# Patient Record
Sex: Female | Born: 1970
Health system: Southern US, Community
[De-identification: ages and names within clinical notes are randomized; demographics above are authoritative.]

## PROBLEM LIST (undated history)

## (undated) DIAGNOSIS — M1990 Unspecified osteoarthritis, unspecified site: Secondary | ICD-10-CM

## (undated) DIAGNOSIS — E785 Hyperlipidemia, unspecified: Secondary | ICD-10-CM

## (undated) DIAGNOSIS — T8859XA Other complications of anesthesia, initial encounter: Secondary | ICD-10-CM

## (undated) DIAGNOSIS — F32A Depression, unspecified: Secondary | ICD-10-CM

## (undated) DIAGNOSIS — Z9889 Other specified postprocedural states: Secondary | ICD-10-CM

## (undated) DIAGNOSIS — Z87440 Personal history of urinary (tract) infections: Secondary | ICD-10-CM

## (undated) DIAGNOSIS — R112 Nausea with vomiting, unspecified: Secondary | ICD-10-CM

## (undated) DIAGNOSIS — T7840XA Allergy, unspecified, initial encounter: Secondary | ICD-10-CM

## (undated) DIAGNOSIS — K921 Melena: Secondary | ICD-10-CM

## (undated) DIAGNOSIS — D649 Anemia, unspecified: Secondary | ICD-10-CM

## (undated) DIAGNOSIS — F419 Anxiety disorder, unspecified: Secondary | ICD-10-CM

## (undated) DIAGNOSIS — K219 Gastro-esophageal reflux disease without esophagitis: Secondary | ICD-10-CM

## (undated) DIAGNOSIS — Z8619 Personal history of other infectious and parasitic diseases: Secondary | ICD-10-CM

## (undated) HISTORY — PX: COSMETIC SURGERY: SHX468

## (undated) HISTORY — DX: Anemia, unspecified: D64.9

## (undated) HISTORY — DX: Personal history of other infectious and parasitic diseases: Z86.19

## (undated) HISTORY — PX: DILATION AND CURETTAGE OF UTERUS: SHX78

## (undated) HISTORY — DX: Allergy, unspecified, initial encounter: T78.40XA

## (undated) HISTORY — PX: WISDOM TOOTH EXTRACTION: SHX21

## (undated) HISTORY — DX: Unspecified osteoarthritis, unspecified site: M19.90

## (undated) HISTORY — DX: Melena: K92.1

## (undated) HISTORY — DX: Hyperlipidemia, unspecified: E78.5

## (undated) HISTORY — DX: Anxiety disorder, unspecified: F41.9

## (undated) HISTORY — DX: Depression, unspecified: F32.A

## (undated) HISTORY — DX: Personal history of urinary (tract) infections: Z87.440

## (undated) HISTORY — DX: Gastro-esophageal reflux disease without esophagitis: K21.9

---

## 1999-09-21 HISTORY — PX: AUGMENTATION MAMMAPLASTY: SUR837

## 1999-09-21 HISTORY — PX: OTHER SURGICAL HISTORY: SHX169

## 2005-09-10 ENCOUNTER — Ambulatory Visit: Payer: Self-pay

## 2005-09-13 ENCOUNTER — Ambulatory Visit: Payer: Self-pay

## 2005-09-14 ENCOUNTER — Ambulatory Visit: Payer: Self-pay

## 2006-05-03 ENCOUNTER — Inpatient Hospital Stay: Payer: Self-pay

## 2008-02-14 ENCOUNTER — Ambulatory Visit: Payer: Self-pay | Admitting: Unknown Physician Specialty

## 2008-02-15 ENCOUNTER — Inpatient Hospital Stay: Payer: Self-pay | Admitting: Unknown Physician Specialty

## 2008-05-16 ENCOUNTER — Ambulatory Visit: Payer: Self-pay | Admitting: Unknown Physician Specialty

## 2011-09-10 ENCOUNTER — Ambulatory Visit: Payer: Self-pay | Admitting: Unknown Physician Specialty

## 2011-12-31 ENCOUNTER — Ambulatory Visit: Payer: Self-pay

## 2013-02-06 ENCOUNTER — Ambulatory Visit: Payer: Self-pay

## 2014-02-19 ENCOUNTER — Ambulatory Visit: Payer: Self-pay | Admitting: Family Medicine

## 2015-01-29 ENCOUNTER — Other Ambulatory Visit: Payer: Self-pay | Admitting: Family Medicine

## 2015-01-29 DIAGNOSIS — Z1231 Encounter for screening mammogram for malignant neoplasm of breast: Secondary | ICD-10-CM

## 2015-02-21 ENCOUNTER — Ambulatory Visit
Admission: RE | Admit: 2015-02-21 | Discharge: 2015-02-21 | Disposition: A | Discharge status: Home / Self Care | Payer: 59 | Source: Ambulatory Visit | Attending: Family Medicine | Admitting: Family Medicine

## 2015-02-21 DIAGNOSIS — Z1231 Encounter for screening mammogram for malignant neoplasm of breast: Secondary | ICD-10-CM | POA: Insufficient documentation

## 2015-02-21 DIAGNOSIS — Z9882 Breast implant status: Secondary | ICD-10-CM | POA: Insufficient documentation

## 2015-06-13 ENCOUNTER — Telehealth: Payer: Self-pay

## 2015-06-13 NOTE — Telephone Encounter (Signed)
Pt concern for having bloated stomach with pain overnight, pt stated that she thinks it is gastritis, she said that yesterday she felt like she was hungry with pain and after eating her pain went away and once she became hungry her stomach started hurting again with some burning going up her esophagus; she was offered to be seen today at 2:00 pm or 2:30 pm with either Mikki Santee or Sonia Baller and she stated that she could not come do to her children and school.   Mikki Santee was verbally notified and awared of this pt; Mikki Santee ordered for pt to take 2 Prevacid 15 mg OTC and if not getting better he would see her on Saturday,( tomorrow), and that she can take it for 2 - 4 weeks. She was also advised to follow a blend diet. She verbalized fully understanding and stated that if she her symptoms get worst she would go to the Urgent care or ER. She was also advised if any questions or concerns.

## 2015-09-09 ENCOUNTER — Encounter: Payer: Self-pay | Admitting: Family Medicine

## 2015-09-09 ENCOUNTER — Ambulatory Visit (INDEPENDENT_AMBULATORY_CARE_PROVIDER_SITE_OTHER): Payer: 59 | Admitting: Family Medicine

## 2015-09-09 VITALS — BP 110/70 | HR 84 | Temp 99.4°F | Resp 16 | Wt 151.0 lb

## 2015-09-09 DIAGNOSIS — J069 Acute upper respiratory infection, unspecified: Secondary | ICD-10-CM

## 2015-09-09 DIAGNOSIS — M722 Plantar fascial fibromatosis: Secondary | ICD-10-CM | POA: Insufficient documentation

## 2015-09-09 NOTE — Progress Notes (Signed)
Subjective:     [name], female   DOB: 06-09-1971, 44 y.o.   MRN: [id number]  HPI  Chief Complaint  Patient presents with  . Sinus Problem    Patient comes in office today with concerns of sinus pain and pressure for the past 24 hrs. Patient states that she has pressure around her entire head and has had pressure in her chest. Patient has taken otc: Cold/Flu medicine and Allergy pills  States one of her children has been ill as well. Reports she typically has trouble clearing her sinuses and develops a sinus infection. No hx of sinus surgery.   Review of Systems  Constitutional: Negative for fever and chills.       Objective:   Physical Exam  Constitutional: She appears well-developed and well-nourished. No distress.  Ears: T.M's intact without inflammation Sinuses: non-tender Throat: no tonsillar enlargement or exudate Neck: no cervical adenopathy Lungs: clear     Assessment:    1. Upper respiratory infection    Plan:    Discussed use of Mucinex D for congestion, Delsym for cough, and Benadryl for postnasal drainage.May also use nasal decongestant spray for 3 days.

## 2015-09-09 NOTE — Patient Instructions (Addendum)
Discussed use of Mucinex D for congestion, Delsym for cough, and Benadryl for postnasal drainage. May also use Afrin nasal spray or similar for 3 days.

## 2015-10-18 DIAGNOSIS — J0101 Acute recurrent maxillary sinusitis: Secondary | ICD-10-CM | POA: Diagnosis not present

## 2015-10-21 ENCOUNTER — Other Ambulatory Visit: Payer: Self-pay | Admitting: Family Medicine

## 2015-12-17 ENCOUNTER — Ambulatory Visit (INDEPENDENT_AMBULATORY_CARE_PROVIDER_SITE_OTHER): Payer: 59 | Admitting: Internal Medicine

## 2015-12-17 ENCOUNTER — Encounter: Payer: Self-pay | Admitting: Internal Medicine

## 2015-12-17 VITALS — BP 120/82 | HR 85 | Temp 98.9°F | Resp 18 | Ht 62.5 in | Wt 168.0 lb

## 2015-12-17 DIAGNOSIS — N898 Other specified noninflammatory disorders of vagina: Secondary | ICD-10-CM | POA: Diagnosis not present

## 2015-12-17 DIAGNOSIS — Z91048 Other nonmedicinal substance allergy status: Secondary | ICD-10-CM | POA: Diagnosis not present

## 2015-12-17 DIAGNOSIS — Z658 Other specified problems related to psychosocial circumstances: Secondary | ICD-10-CM | POA: Diagnosis not present

## 2015-12-17 DIAGNOSIS — Z9109 Other allergy status, other than to drugs and biological substances: Secondary | ICD-10-CM

## 2015-12-17 DIAGNOSIS — E7800 Pure hypercholesterolemia, unspecified: Secondary | ICD-10-CM | POA: Diagnosis not present

## 2015-12-17 DIAGNOSIS — F439 Reaction to severe stress, unspecified: Secondary | ICD-10-CM

## 2015-12-17 MED ORDER — NORGESTREL-ETHINYL ESTRADIOL 0.3-30 MG-MCG PO TABS: 1.0000 | ORAL_TABLET | Freq: Every day | ORAL | Status: DC

## 2015-12-17 MED ORDER — CITALOPRAM HYDROBROMIDE 10 MG PO TABS: 10.0000 mg | ORAL_TABLET | Freq: Every day | ORAL | Status: DC

## 2015-12-17 NOTE — Progress Notes (Signed)
[name], female   DOB: March 13, 1971, 45 y.o.   MRN: [id number]   Subjective:    [name], female    DOB: 1970-10-18, 45 y.o.   MRN: [id number]  HPI  Patient here to establish care.  Has been followed at [facility] and [facility].  She has some issues with allergies.  Controlled on her current regimen.  No chest congestion.  No sob.  No acid reflux.  No abdominal pain or cramping.  Bowels stable.  Does report noticing some vaginal discharge.  Will have her period and the a discharge for another week.  Has been going on for 6 months - one year.  Reports increased stress.  Mother with dementia.  Father with cancer.  Also father-n-law - cancer.  She is having to help take care of them.  Increased stress related to this.  Feels she needs something to help level things off.     Past Medical History  Diagnosis Date  . Allergy   . Hyperlipidemia   . Blood in stool     H/O  . History of chicken pox   . Hx: UTI (urinary tract infection)    Past Surgical History  Procedure Laterality Date  . Augmentation mammaplasty  2001  . Cesarean section  2007 and 2009  . Tummy tuck  2001   Family History  Problem Relation Age of Onset  . Healthy Mother   . Hypertension Mother   . Cancer Father   . Atrial fibrillation Father   . Heart disease Father   . Hypertension Father   . Arthritis Sister   . Asthma Sister   . Hypertension Sister   . Healthy Daughter   . Healthy Son   . Alzheimer's disease Maternal Grandmother   . Healthy Sister   . Arthritis Maternal Grandfather    Social History   Social History  . Marital Status: Married    Spouse Name: N/A  . Number of Children: N/A  . Years of Education: N/A   Social History Main Topics  . Smoking status: Never Smoker   . Smokeless tobacco: Never Used  . Alcohol Use: 0.0 oz/week    0 Standard drinks or equivalent per week     Comment: occasional  . Drug Use: No  . Sexual Activity: Not Asked     Other Topics Concern  . None   Social History Narrative    Outpatient Encounter Prescriptions as of 12/17/2015  Medication Sig  . cetirizine (ZYRTEC) 10 MG tablet Take 10 mg by mouth daily.  . fluticasone (FLONASE) 50 MCG/ACT nasal spray PLACE 1 SPRAY INTO BOTH NOSTRILS 2 (TWO) TIMES DAILY.  Marland Kitchen ibuprofen (ADVIL,MOTRIN) 200 MG tablet Take by mouth.  . norgestrel-ethinyl estradiol (ELINEST) 0.3-30 MG-MCG tablet Take 1 tablet by mouth daily.  . [DISCONTINUED] ELINEST 0.3-30 MG-MCG tablet TAKE 1 TABLET BY MOUTH ONCE DAILY  . citalopram (CELEXA) 10 MG tablet Take 1 tablet (10 mg total) by mouth daily.   No facility-administered encounter medications on file as of 12/17/2015.    Review of Systems  Constitutional: Negative for appetite change and unexpected weight change.  HENT: Negative for congestion and sinus pressure.   Eyes: Negative for discharge and redness.  Respiratory: Negative for cough, chest tightness and shortness of breath.   Cardiovascular: Negative for chest pain, palpitations and leg swelling.  Gastrointestinal: Negative for nausea, vomiting, abdominal pain and diarrhea.  Genitourinary: Positive for vaginal discharge. Negative for dysuria and  difficulty urinating.  Musculoskeletal: Negative for back pain and joint swelling.  Skin: Negative for color change and rash.  Neurological: Negative for dizziness, light-headedness and headaches.  Psychiatric/Behavioral:       Increased stress as outlined.         Objective:    Physical Exam  Constitutional: She appears well-developed and well-nourished. No distress.  HENT:  Nose: Nose normal.  Mouth/Throat: Oropharynx is clear and moist.  Neck: Neck supple. No thyromegaly present.  Cardiovascular: Normal rate and regular rhythm.   Pulmonary/Chest: Breath sounds normal. No respiratory distress. She has no wheezes.  Abdominal: Soft. Bowel sounds are normal. There is no tenderness.  Musculoskeletal: She exhibits no edema  or tenderness.  Lymphadenopathy:    She has no cervical adenopathy.  Skin: No rash noted. No erythema.  Psychiatric: She has a normal mood and affect. Her behavior is normal.    BP 120/82 mmHg  Pulse 85  Temp(Src) 98.9 F (37.2 C) (Oral)  Resp 18  Ht 5' 2.5" (1.588 m)  Wt 168 lb (76.204 kg)  BMI 30.22 kg/m2  SpO2 97%  LMP 12/16/2015 (Exact Date) Wt Readings from Last 3 Encounters:  12/17/15 168 lb (76.204 kg)  09/09/15 151 lb (68.493 kg)  12/10/14 119 lb 3.2 oz (54.069 kg)     Mm Digital Screening W/ Implants Bilateral  02/21/2015  CLINICAL DATA:  Screening. EXAM: DIGITAL SCREENING BILATERAL MAMMOGRAM WITH IMPLANTS AND CAD The patient has subglandular silicone implants. Standard and implant displaced views were performed. COMPARISON:  Previous exam(s). ACR Breast Density Category b: There are scattered areas of fibroglandular density. FINDINGS: There are no findings suspicious for malignancy. Images were processed with CAD. IMPRESSION: No mammographic evidence of malignancy. A result letter of this screening mammogram will be mailed directly to the patient. RECOMMENDATION: Screening mammogram in one year. (Code:SM-B-01Y) BI-RADS CATEGORY  1:  Negative. Electronically Signed   By: Conchita Paris M.D.   On: 02/21/2015 11:04       Assessment & Plan:   Problem List Items Addressed This Visit    Environmental allergies - Primary    Controlled on current regimen.  Follow.       Relevant Orders   CBC with Differential/Platelet   Hypercholesterolemia    Low cholesterol diet and exercise.  Follow lipid panel.        Relevant Orders   Comprehensive metabolic panel   Lipid panel   Stress    Discussed with her today.  Discussed treatment options.  Discussed counseling.  Will start citalopram 10mg  q day.  Follow.  Get her back in soon to reassess.        Relevant Orders   TSH   Vaginal discharge    Persistent after every period.  Will extend another week after her period.   Discussed pelvic ultrasound.  She will notify me if desires to pursue further testing.           Einar Pheasant, MD

## 2015-12-17 NOTE — Progress Notes (Signed)
Pre-visit discussion using our clinic review tool. No additional management support is needed unless otherwise documented below in the visit note.  

## 2015-12-22 ENCOUNTER — Telehealth: Payer: Self-pay | Admitting: Internal Medicine

## 2015-12-22 ENCOUNTER — Telehealth: Payer: Self-pay | Admitting: Family Medicine

## 2015-12-22 DIAGNOSIS — N926 Irregular menstruation, unspecified: Secondary | ICD-10-CM

## 2015-12-22 DIAGNOSIS — N898 Other specified noninflammatory disorders of vagina: Secondary | ICD-10-CM

## 2015-12-22 NOTE — Telephone Encounter (Signed)
Pt had an appointment with Dr. Nicki Reaper last week (3/29) They talked about pt getting an ultrasound. Pt has decided to have one done. Can Dr. Nicki Reaper write the orders for the ultrasound?  Thanks

## 2015-12-23 ENCOUNTER — Encounter: Payer: Self-pay | Admitting: Internal Medicine

## 2015-12-23 DIAGNOSIS — N898 Other specified noninflammatory disorders of vagina: Secondary | ICD-10-CM | POA: Insufficient documentation

## 2015-12-23 DIAGNOSIS — E7800 Pure hypercholesterolemia, unspecified: Secondary | ICD-10-CM | POA: Insufficient documentation

## 2015-12-23 DIAGNOSIS — Z9109 Other allergy status, other than to drugs and biological substances: Secondary | ICD-10-CM | POA: Insufficient documentation

## 2015-12-23 DIAGNOSIS — F439 Reaction to severe stress, unspecified: Secondary | ICD-10-CM | POA: Insufficient documentation

## 2015-12-23 NOTE — Assessment & Plan Note (Signed)
Discussed with her today.  Discussed treatment options.  Discussed counseling.  Will start citalopram 10mg  q day.  Follow.  Get her back in soon to reassess.

## 2015-12-23 NOTE — Assessment & Plan Note (Signed)
Controlled on current regimen.  Follow.  

## 2015-12-23 NOTE — Telephone Encounter (Signed)
She has previously been evaluated at [facility].  I can place an order for a pelvic ultrasound that would be done at the hospital or I can refer her to [facility] for further evaluation and let them do the pelvic ultrasound there if they feel appropriate.  I will do it whichever way she prefers.  Just let me know and I will place the order.

## 2015-12-23 NOTE — Assessment & Plan Note (Signed)
Persistent after every period.  Will extend another week after her period.  Discussed pelvic ultrasound.  She will notify me if desires to pursue further testing.

## 2015-12-23 NOTE — Telephone Encounter (Signed)
Pt wants it done at the hospital but would like for them to call her to schedule the appt due to her hectic schedule.

## 2015-12-23 NOTE — Telephone Encounter (Signed)
See pts phone message.  Ultrasound ordered.  Pt wants to be called when scheduled secondary to hectic schedule.   Thanks

## 2015-12-23 NOTE — Assessment & Plan Note (Signed)
Low cholesterol diet and exercise.  Follow lipid panel.   

## 2015-12-26 ENCOUNTER — Other Ambulatory Visit (INDEPENDENT_AMBULATORY_CARE_PROVIDER_SITE_OTHER): Payer: 59

## 2015-12-26 DIAGNOSIS — F439 Reaction to severe stress, unspecified: Secondary | ICD-10-CM

## 2015-12-26 DIAGNOSIS — E7800 Pure hypercholesterolemia, unspecified: Secondary | ICD-10-CM | POA: Diagnosis not present

## 2015-12-26 DIAGNOSIS — Z91048 Other nonmedicinal substance allergy status: Secondary | ICD-10-CM

## 2015-12-26 DIAGNOSIS — Z658 Other specified problems related to psychosocial circumstances: Secondary | ICD-10-CM

## 2015-12-26 DIAGNOSIS — Z9109 Other allergy status, other than to drugs and biological substances: Secondary | ICD-10-CM

## 2015-12-26 LAB — CBC WITH DIFFERENTIAL/PLATELET
Basophils Absolute: 0 10*3/uL (ref 0.0–0.1)
Basophils Relative: 0.3 % (ref 0.0–3.0)
EOS ABS: 0.2 10*3/uL (ref 0.0–0.7)
Eosinophils Relative: 2.4 % (ref 0.0–5.0)
HCT: 41.5 % (ref 36.0–46.0)
HEMOGLOBIN: 14.1 g/dL (ref 12.0–15.0)
LYMPHS ABS: 1.4 10*3/uL (ref 0.7–4.0)
Lymphocytes Relative: 21.3 % (ref 12.0–46.0)
MCHC: 34.1 g/dL (ref 30.0–36.0)
MCV: 89.1 fl (ref 78.0–100.0)
MONO ABS: 0.3 10*3/uL (ref 0.1–1.0)
Monocytes Relative: 5.2 % (ref 3.0–12.0)
NEUTROS PCT: 70.8 % (ref 43.0–77.0)
Neutro Abs: 4.7 10*3/uL (ref 1.4–7.7)
Platelets: 297 10*3/uL (ref 150.0–400.0)
RBC: 4.66 Mil/uL (ref 3.87–5.11)
RDW: 12.4 % (ref 11.5–15.5)
WBC: 6.6 10*3/uL (ref 4.0–10.5)

## 2015-12-26 LAB — COMPREHENSIVE METABOLIC PANEL
ALBUMIN: 4.2 g/dL (ref 3.5–5.2)
ALT: 14 U/L (ref 0–35)
AST: 18 U/L (ref 0–37)
Alkaline Phosphatase: 42 U/L (ref 39–117)
BUN: 12 mg/dL (ref 6–23)
CHLORIDE: 103 meq/L (ref 96–112)
CO2: 25 mEq/L (ref 19–32)
CREATININE: 0.89 mg/dL (ref 0.40–1.20)
Calcium: 9.1 mg/dL (ref 8.4–10.5)
GFR: 72.96 mL/min (ref >60.00)
GLUCOSE: 87 mg/dL (ref 70–99)
Potassium: 4 mEq/L (ref 3.5–5.1)
SODIUM: 136 meq/L (ref 135–145)
Total Bilirubin: 0.6 mg/dL (ref 0.2–1.2)
Total Protein: 6.8 g/dL (ref 6.0–8.3)

## 2015-12-26 LAB — LIPID PANEL
CHOL/HDL RATIO: 4
Cholesterol: 226 mg/dL — ABNORMAL HIGH (ref 0–200)
HDL: 57.3 mg/dL (ref >39.00)
LDL CALC: 148 mg/dL — AB (ref 0–99)
NONHDL: 168.94
Triglycerides: 104 mg/dL (ref 0.0–149.0)
VLDL: 20.8 mg/dL (ref 0.0–40.0)

## 2015-12-26 LAB — TSH: TSH: 1.26 u[IU]/mL (ref 0.35–4.50)

## 2015-12-29 ENCOUNTER — Encounter: Payer: Self-pay | Admitting: *Deleted

## 2015-12-30 ENCOUNTER — Ambulatory Visit: Payer: 59

## 2016-01-05 ENCOUNTER — Ambulatory Visit
Admission: RE | Admit: 2016-01-05 | Discharge: 2016-01-05 | Disposition: A | Discharge status: Home / Self Care | Payer: 59 | Source: Ambulatory Visit | Attending: Internal Medicine | Admitting: Internal Medicine

## 2016-01-05 DIAGNOSIS — N898 Other specified noninflammatory disorders of vagina: Secondary | ICD-10-CM | POA: Diagnosis not present

## 2016-01-05 DIAGNOSIS — N926 Irregular menstruation, unspecified: Secondary | ICD-10-CM | POA: Diagnosis not present

## 2016-01-05 DIAGNOSIS — N939 Abnormal uterine and vaginal bleeding, unspecified: Secondary | ICD-10-CM | POA: Diagnosis not present

## 2016-01-07 ENCOUNTER — Other Ambulatory Visit: Payer: Self-pay | Admitting: Internal Medicine

## 2016-01-07 DIAGNOSIS — N898 Other specified noninflammatory disorders of vagina: Secondary | ICD-10-CM

## 2016-01-07 NOTE — Progress Notes (Signed)
Order placed for gyn referral.  

## 2016-01-15 ENCOUNTER — Other Ambulatory Visit: Payer: Self-pay | Admitting: Internal Medicine

## 2016-01-15 DIAGNOSIS — Z1231 Encounter for screening mammogram for malignant neoplasm of breast: Secondary | ICD-10-CM

## 2016-01-22 DIAGNOSIS — N939 Abnormal uterine and vaginal bleeding, unspecified: Secondary | ICD-10-CM | POA: Diagnosis not present

## 2016-01-22 LAB — HM PAP SMEAR: HM Pap smear: NEGATIVE

## 2016-02-03 DIAGNOSIS — N939 Abnormal uterine and vaginal bleeding, unspecified: Secondary | ICD-10-CM | POA: Diagnosis not present

## 2016-02-12 ENCOUNTER — Encounter: Payer: Self-pay | Admitting: Internal Medicine

## 2016-02-19 ENCOUNTER — Other Ambulatory Visit: Payer: Self-pay | Admitting: *Deleted

## 2016-02-19 ENCOUNTER — Telehealth: Payer: Self-pay | Admitting: Internal Medicine

## 2016-02-19 MED ORDER — CITALOPRAM HYDROBROMIDE 10 MG PO TABS: 10.0000 mg | ORAL_TABLET | Freq: Every day | ORAL | Status: DC

## 2016-02-19 NOTE — Telephone Encounter (Signed)
Sent refill in to pharmacy. Notified patient

## 2016-02-19 NOTE — Telephone Encounter (Signed)
Pt called needing a refill for citalopram (CELEXA) 10 MG tablet.  Pharmacy is [facility], [address]  Call pt @ [phone]. Thank you!

## 2016-02-20 DIAGNOSIS — N939 Abnormal uterine and vaginal bleeding, unspecified: Secondary | ICD-10-CM | POA: Diagnosis not present

## 2016-02-24 ENCOUNTER — Ambulatory Visit: Payer: 59

## 2016-03-10 ENCOUNTER — Telehealth: Payer: Self-pay | Admitting: Internal Medicine

## 2016-03-10 NOTE — Telephone Encounter (Signed)
I do not mind adjusting medication if needed.  Since I have only seen her one time to establish care, I would like for her to schedule an appt so that we can discuss.  If agreeable, please forward to ashley chrismon to schedule an appt.  Thanks

## 2016-03-10 NOTE — Telephone Encounter (Signed)
Pt called needing a refill for citalopram (CELEXA) 10 MG tablet. Pt Gyn wants pt to move up to 20 mg. Pt wanted to know if Dr Nicki Reaper will up her mg?   Pharmacy is [facility], [address]  Call pt @ [phone]. Thank you!

## 2016-03-10 NOTE — Telephone Encounter (Signed)
Please advise 

## 2016-03-11 ENCOUNTER — Other Ambulatory Visit: Payer: Self-pay | Admitting: Internal Medicine

## 2016-03-11 NOTE — Telephone Encounter (Signed)
Pt scheduled for July 24th @ 4:30

## 2016-03-11 NOTE — Telephone Encounter (Signed)
Please advise 

## 2016-03-15 ENCOUNTER — Encounter: Payer: 59 | Admitting: Internal Medicine

## 2016-04-12 ENCOUNTER — Ambulatory Visit: Payer: 59 | Admitting: Internal Medicine

## 2016-05-26 ENCOUNTER — Encounter (INDEPENDENT_AMBULATORY_CARE_PROVIDER_SITE_OTHER): Payer: Self-pay

## 2016-05-26 ENCOUNTER — Ambulatory Visit (INDEPENDENT_AMBULATORY_CARE_PROVIDER_SITE_OTHER): Payer: 59 | Admitting: Internal Medicine

## 2016-05-26 ENCOUNTER — Encounter: Payer: Self-pay | Admitting: Internal Medicine

## 2016-05-26 DIAGNOSIS — E7800 Pure hypercholesterolemia, unspecified: Secondary | ICD-10-CM | POA: Diagnosis not present

## 2016-05-26 DIAGNOSIS — F439 Reaction to severe stress, unspecified: Secondary | ICD-10-CM

## 2016-05-26 DIAGNOSIS — Z658 Other specified problems related to psychosocial circumstances: Secondary | ICD-10-CM

## 2016-05-26 DIAGNOSIS — N926 Irregular menstruation, unspecified: Secondary | ICD-10-CM

## 2016-05-26 DIAGNOSIS — Z23 Encounter for immunization: Secondary | ICD-10-CM | POA: Diagnosis not present

## 2016-05-26 NOTE — Progress Notes (Signed)
Pre visit review using our clinic review tool, if applicable. No additional management support is needed unless otherwise documented below in the visit note. 

## 2016-05-26 NOTE — Progress Notes (Signed)
[name], female   DOB: [birth year], 45 y.o.   MRN: [id number]   Subjective:    [name], female    DOB: Dec 24, 1970, 45 y.o.   MRN: [id number]  HPI  Patient here for a scheduled follow up.  Has been seeing gyn.  Seeing Dr Star Age now.  Was questioning early menopause.  Off OCPs.  Has had bleeding.  Planning to f/u with gyn.  We discussed her increased stress.  Father passed away.  Helping to take care of her mother. Increased stress related to this.  She has gained weight.  Discussed diet and exercise.  On citalopram.  Taking 20mg  per day now.  No chest pain.  No sob.  No acid reflux.  No abdominal pain or cramping.  Bowels stable.  She has started walking.  Plans to try to start exercising more.    Past Medical History:  Diagnosis Date  . Allergy   . Blood in stool    H/O  . History of chicken pox   . Hx: UTI (urinary tract infection)   . Hyperlipidemia    Past Surgical History:  Procedure Laterality Date  . AUGMENTATION MAMMAPLASTY  2001  . CESAREAN SECTION  2007 and 2009  . tummy tuck  2001   Family History  Problem Relation Age of Onset  . Healthy Mother   . Hypertension Mother   . Cancer Father   . Atrial fibrillation Father   . Heart disease Father   . Hypertension Father   . Arthritis Sister   . Asthma Sister   . Hypertension Sister   . Healthy Daughter   . Healthy Son   . Alzheimer's disease Maternal Grandmother   . Healthy Sister   . Arthritis Maternal Grandfather    Social History   Social History  . Marital status: Married    Spouse name: N/A  . Number of children: N/A  . Years of education: N/A   Social History Main Topics  . Smoking status: Never Smoker  . Smokeless tobacco: Never Used  . Alcohol use 0.0 oz/week     Comment: occasional  . Drug use: No  . Sexual activity: Not Asked   Other Topics Concern  . None   Social History Narrative  . None    Outpatient Encounter Prescriptions as of 05/26/2016    Medication Sig  . cetirizine (ZYRTEC) 10 MG tablet Take 10 mg by mouth daily.  . citalopram (CELEXA) 10 MG tablet TAKE 1 TABLET (10 MG TOTAL) BY MOUTH DAILY. (Patient taking differently: Take 2 Tablets by mouth daily)  . fluticasone (FLONASE) 50 MCG/ACT nasal spray PLACE 1 SPRAY INTO BOTH NOSTRILS 2 (TWO) TIMES DAILY.  Marland Kitchen ibuprofen (ADVIL,MOTRIN) 200 MG tablet Take by mouth.  . norgestrel-ethinyl estradiol (ELINEST) 0.3-30 MG-MCG tablet Take 1 tablet by mouth daily. (Patient not taking: Reported on 05/26/2016)   No facility-administered encounter medications on file as of 05/26/2016.     Review of Systems  Constitutional: Negative for appetite change.       Increased eight as outlined.   HENT: Negative for congestion and sinus pressure.   Respiratory: Negative for cough, chest tightness and shortness of breath.   Cardiovascular: Negative for chest pain, palpitations and leg swelling.  Gastrointestinal: Negative for abdominal pain, diarrhea, nausea and vomiting.  Genitourinary: Negative for difficulty urinating and dysuria.  Musculoskeletal: Negative for back pain and joint swelling.  Skin: Negative for color change and rash.  Neurological: Negative for  dizziness, light-headedness and headaches.  Psychiatric/Behavioral: Negative for agitation and dysphoric mood.       Increased stress as outlined.         Objective:    Physical Exam  Constitutional: She appears well-developed and well-nourished. No distress.  HENT:  Nose: Nose normal.  Mouth/Throat: Oropharynx is clear and moist.  Neck: Neck supple. No thyromegaly present.  Cardiovascular: Normal rate and regular rhythm.   Pulmonary/Chest: Breath sounds normal. No respiratory distress. She has no wheezes.  Abdominal: Soft. Bowel sounds are normal. There is no tenderness.  Musculoskeletal: She exhibits no edema or tenderness.  Lymphadenopathy:    She has no cervical adenopathy.  Skin: No rash noted. No erythema.  Psychiatric: She  has a normal mood and affect. Her behavior is normal.    BP 112/72   Pulse 79   Temp 98.6 F (37 C) (Oral)   Ht 5\' 3"  (1.6 m)   Wt 188 lb 3.2 oz (85.4 kg)   SpO2 97%   BMI 33.34 kg/m  Wt Readings from Last 3 Encounters:  05/26/16 188 lb 3.2 oz (85.4 kg)  12/17/15 168 lb (76.2 kg)  09/09/15 151 lb (68.5 kg)     Lab Results  Component Value Date   WBC 6.6 12/26/2015   HGB 14.1 12/26/2015   HCT 41.5 12/26/2015   PLT 297.0 12/26/2015   GLUCOSE 87 12/26/2015   CHOL 226 (H) 12/26/2015   TRIG 104.0 12/26/2015   HDL 57.30 12/26/2015   LDLCALC 148 (H) 12/26/2015   ALT 14 12/26/2015   AST 18 12/26/2015   NA 136 12/26/2015   K 4.0 12/26/2015   CL 103 12/26/2015   CREATININE 0.89 12/26/2015   BUN 12 12/26/2015   CO2 25 12/26/2015   TSH 1.26 12/26/2015    US Transvaginal Non-ob  Result Date: 01/05/2016 CLINICAL DATA:  Vaginal discharge. EXAM: TRANSABDOMINAL AND TRANSVAGINAL ULTRASOUND OF PELVIS TECHNIQUE: Both transabdominal and transvaginal ultrasound examinations of the pelvis were performed. Transabdominal technique was performed for global imaging of the pelvis including uterus, ovaries, adnexal regions, and pelvic cul-de-sac. It was necessary to proceed with endovaginal exam following the transabdominal exam to visualize the endometrium. COMPARISON:  None FINDINGS: Uterus Measurements: 7.8 x 3.4 x 4.7 cm. No fibroids or other mass visualized. Endometrium Thickness: Normal thickness, 5 mm.  No focal abnormality visualized. Right ovary Measurements: 3.2 x 2.0 x 2.0 cm. Normal appearance/no adnexal mass. Left ovary Measurements: 3.1 x 2.1 x 1.8 cm. Normal appearance/no adnexal mass. Other findings No abnormal free fluid. IMPRESSION: Unremarkable pelvic ultrasound. Electronically Signed   By: Rolm Baptise M.D.   On: 01/05/2016 11:21   US Pelvis Complete  Result Date: 01/05/2016 CLINICAL DATA:  Vaginal discharge. EXAM: TRANSABDOMINAL AND TRANSVAGINAL ULTRASOUND OF PELVIS TECHNIQUE:  Both transabdominal and transvaginal ultrasound examinations of the pelvis were performed. Transabdominal technique was performed for global imaging of the pelvis including uterus, ovaries, adnexal regions, and pelvic cul-de-sac. It was necessary to proceed with endovaginal exam following the transabdominal exam to visualize the endometrium. COMPARISON:  None FINDINGS: Uterus Measurements: 7.8 x 3.4 x 4.7 cm. No fibroids or other mass visualized. Endometrium Thickness: Normal thickness, 5 mm.  No focal abnormality visualized. Right ovary Measurements: 3.2 x 2.0 x 2.0 cm. Normal appearance/no adnexal mass. Left ovary Measurements: 3.1 x 2.1 x 1.8 cm. Normal appearance/no adnexal mass. Other findings No abnormal free fluid. IMPRESSION: Unremarkable pelvic ultrasound. Electronically Signed   By: Rolm Baptise M.D.   On: 01/05/2016 11:21  Assessment & Plan:   Problem List Items Addressed This Visit    Hypercholesterolemia    Low cholesterol diet and exercise.  Follow lipid panel.        Relevant Orders   Lipid panel   Menstrual irregularity    Bleeding as outlined.  Seeing gyn.  Continue f/u.       Stress    Increased stress as outlined.  Taking 20mg  of citalopram now.  Feels has helped.  Will continue the 20mg  dose for now.  Will notify me when needs rx.         Other Visit Diagnoses    Encounter for immunization       Relevant Orders   Flu Vaccine QUAD 36+ mos IM (Completed)       Einar Pheasant, MD

## 2016-05-30 ENCOUNTER — Encounter: Payer: Self-pay | Admitting: Internal Medicine

## 2016-05-30 DIAGNOSIS — N926 Irregular menstruation, unspecified: Secondary | ICD-10-CM | POA: Insufficient documentation

## 2016-05-30 NOTE — Assessment & Plan Note (Signed)
Low cholesterol diet and exercise.  Follow lipid panel.   

## 2016-05-30 NOTE — Assessment & Plan Note (Signed)
Bleeding as outlined.  Seeing gyn.  Continue f/u.

## 2016-05-30 NOTE — Assessment & Plan Note (Signed)
Increased stress as outlined.  Taking 20mg  of citalopram now.  Feels has helped.  Will continue the 20mg  dose for now.  Will notify me when needs rx.

## 2016-07-01 ENCOUNTER — Other Ambulatory Visit: Payer: Self-pay | Admitting: Internal Medicine

## 2016-07-01 ENCOUNTER — Ambulatory Visit
Admission: RE | Admit: 2016-07-01 | Discharge: 2016-07-01 | Disposition: A | Discharge status: Home / Self Care | Payer: 59 | Source: Ambulatory Visit | Attending: Internal Medicine | Admitting: Internal Medicine

## 2016-07-01 DIAGNOSIS — Z1231 Encounter for screening mammogram for malignant neoplasm of breast: Secondary | ICD-10-CM | POA: Insufficient documentation

## 2016-07-02 ENCOUNTER — Encounter: Payer: Self-pay | Admitting: Internal Medicine

## 2016-07-02 DIAGNOSIS — Z0000 Encounter for general adult medical examination without abnormal findings: Secondary | ICD-10-CM | POA: Insufficient documentation

## 2016-07-06 ENCOUNTER — Ambulatory Visit: Payer: 59 | Admitting: Internal Medicine

## 2016-07-22 ENCOUNTER — Telehealth: Payer: Self-pay | Admitting: *Deleted

## 2016-07-22 ENCOUNTER — Other Ambulatory Visit: Payer: Self-pay

## 2016-07-22 MED ORDER — FLUTICASONE PROPIONATE 50 MCG/ACT NA SUSP: NASAL | 0 refills | Status: DC

## 2016-07-22 NOTE — Telephone Encounter (Signed)
Patient stated that the pharmacy did not receive her  Refill Rx for [place]

## 2016-07-22 NOTE — Telephone Encounter (Signed)
Sent to pharmacy 

## 2016-08-20 ENCOUNTER — Other Ambulatory Visit (INDEPENDENT_AMBULATORY_CARE_PROVIDER_SITE_OTHER): Payer: 59

## 2016-08-20 DIAGNOSIS — E7800 Pure hypercholesterolemia, unspecified: Secondary | ICD-10-CM | POA: Diagnosis not present

## 2016-08-20 LAB — LIPID PANEL
CHOL/HDL RATIO: 5
Cholesterol: 270 mg/dL — ABNORMAL HIGH (ref 0–200)
HDL: 55.7 mg/dL (ref >39.00)
LDL CALC: 192 mg/dL — AB (ref 0–99)
NONHDL: 214.76
TRIGLYCERIDES: 115 mg/dL (ref 0.0–149.0)
VLDL: 23 mg/dL (ref 0.0–40.0)

## 2016-08-23 ENCOUNTER — Other Ambulatory Visit: Payer: Self-pay | Admitting: Internal Medicine

## 2016-08-23 DIAGNOSIS — E875 Hyperkalemia: Secondary | ICD-10-CM

## 2016-08-23 NOTE — Progress Notes (Signed)
Order placed for f/u potassium.  

## 2016-08-25 ENCOUNTER — Ambulatory Visit (INDEPENDENT_AMBULATORY_CARE_PROVIDER_SITE_OTHER): Payer: 59 | Admitting: Internal Medicine

## 2016-08-25 ENCOUNTER — Encounter: Payer: Self-pay | Admitting: Internal Medicine

## 2016-08-25 VITALS — BP 110/80 | HR 73 | Temp 98.7°F | Ht 63.0 in | Wt 191.4 lb

## 2016-08-25 DIAGNOSIS — N926 Irregular menstruation, unspecified: Secondary | ICD-10-CM

## 2016-08-25 DIAGNOSIS — F439 Reaction to severe stress, unspecified: Secondary | ICD-10-CM | POA: Diagnosis not present

## 2016-08-25 DIAGNOSIS — E7800 Pure hypercholesterolemia, unspecified: Secondary | ICD-10-CM | POA: Diagnosis not present

## 2016-08-25 MED ORDER — CITALOPRAM HYDROBROMIDE 20 MG PO TABS: 20.0000 mg | ORAL_TABLET | Freq: Every day | ORAL | 3 refills | Status: DC

## 2016-08-25 MED ORDER — ROSUVASTATIN CALCIUM 10 MG PO TABS
10.0000 mg | ORAL_TABLET | Freq: Every day | ORAL | 32 refills | Status: DC
Start: 2016-08-25 — End: 2016-12-29

## 2016-08-25 NOTE — Progress Notes (Signed)
[name], female   DOB: 10/27/1970, 45 y.o.   MRN: [id number]   Subjective:    [name], female    DOB: 20-Oct-1970, 45 y.o.   MRN: [id number]  HPI  Patient here for a scheduled follow up.  She reports she is doing relatively well.  Tries to stay active.  Has gained weight.  Discussed diet and exercise.  No chest pain.  No sob.  No acid reflux.  No abdominal pain or cramping.  Bowels stable.  Off ocp's.  Periods more regular.  Still with increased stress, but overall she feels she is handling things relatively well.     Past Medical History:  Diagnosis Date  . Allergy   . Blood in stool    H/O  . History of chicken pox   . Hx: UTI (urinary tract infection)   . Hyperlipidemia    Past Surgical History:  Procedure Laterality Date  . AUGMENTATION MAMMAPLASTY  2001  . CESAREAN SECTION  2007 and 2009  . tummy tuck  2001   Family History  Problem Relation Age of Onset  . Healthy Mother   . Hypertension Mother   . Cancer Father   . Atrial fibrillation Father   . Heart disease Father   . Hypertension Father   . Arthritis Sister   . Asthma Sister   . Hypertension Sister   . Healthy Daughter   . Healthy Son   . Alzheimer's disease Maternal Grandmother   . Healthy Sister   . Arthritis Maternal Grandfather   . Breast cancer Neg Hx    Social History   Social History  . Marital status: Married    Spouse name: N/A  . Number of children: N/A  . Years of education: N/A   Social History Main Topics  . Smoking status: Never Smoker  . Smokeless tobacco: Never Used  . Alcohol use 0.0 oz/week     Comment: occasional  . Drug use: No  . Sexual activity: Not Asked   Other Topics Concern  . None   Social History Narrative  . None    Outpatient Encounter Prescriptions as of 08/25/2016  Medication Sig  . cetirizine (ZYRTEC) 10 MG tablet Take 10 mg by mouth daily.  . fluticasone (FLONASE) 50 MCG/ACT nasal spray PLACE 1 SPRAY INTO BOTH NOSTRILS  2 (TWO) TIMES DAILY.  Marland Kitchen ibuprofen (ADVIL,MOTRIN) 200 MG tablet Take by mouth.  . [DISCONTINUED] citalopram (CELEXA) 10 MG tablet TAKE 1 TABLET (10 MG TOTAL) BY MOUTH DAILY. (Patient taking differently: Take 2 Tablets by mouth daily)  . citalopram (CELEXA) 20 MG tablet Take 1 tablet (20 mg total) by mouth daily.  . rosuvastatin (CRESTOR) 10 MG tablet Take 1 tablet (10 mg total) by mouth daily.  . [DISCONTINUED] norgestrel-ethinyl estradiol (ELINEST) 0.3-30 MG-MCG tablet Take 1 tablet by mouth daily. (Patient not taking: Reported on 05/26/2016)   No facility-administered encounter medications on file as of 08/25/2016.     Review of Systems  Constitutional: Negative for appetite change and unexpected weight change.  HENT: Negative for congestion and sinus pressure.   Respiratory: Negative for cough, chest tightness and shortness of breath.   Cardiovascular: Negative for chest pain, palpitations and leg swelling.  Gastrointestinal: Negative for abdominal pain, diarrhea, nausea and vomiting.  Genitourinary: Negative for difficulty urinating and dysuria.  Musculoskeletal: Negative for back pain and joint swelling.  Skin: Negative for color change and rash.  Neurological: Negative for dizziness, light-headedness and headaches.  Psychiatric/Behavioral: Negative for agitation and dysphoric mood.       Objective:    Physical Exam  Constitutional: She appears well-developed and well-nourished. No distress.  HENT:  Nose: Nose normal.  Mouth/Throat: Oropharynx is clear and moist.  Neck: Neck supple. No thyromegaly present.  Cardiovascular: Normal rate and regular rhythm.   Pulmonary/Chest: Breath sounds normal. No respiratory distress. She has no wheezes.  Abdominal: Soft. Bowel sounds are normal. There is no tenderness.  Musculoskeletal: She exhibits no edema or tenderness.  Lymphadenopathy:    She has no cervical adenopathy.  Skin: No rash noted. No erythema.  Psychiatric: She has a normal  mood and affect. Her behavior is normal.    BP 110/80   Pulse 73   Temp 98.7 F (37.1 C) (Oral)   Ht 5\' 3"  (1.6 m)   Wt 191 lb 6.4 oz (86.8 kg)   SpO2 98%   BMI 33.90 kg/m  Wt Readings from Last 3 Encounters:  08/25/16 191 lb 6.4 oz (86.8 kg)  05/26/16 188 lb 3.2 oz (85.4 kg)  12/17/15 168 lb (76.2 kg)     Lab Results  Component Value Date   WBC 6.6 12/26/2015   HGB 14.1 12/26/2015   HCT 41.5 12/26/2015   PLT 297.0 12/26/2015   GLUCOSE 87 12/26/2015   CHOL 270 (H) 08/20/2016   TRIG 115.0 08/20/2016   HDL 55.70 08/20/2016   LDLCALC 192 (H) 08/20/2016   ALT 14 12/26/2015   AST 18 12/26/2015   NA 136 12/26/2015   K 4.0 12/26/2015   CL 103 12/26/2015   CREATININE 0.89 12/26/2015   BUN 12 12/26/2015   CO2 25 12/26/2015   TSH 1.26 12/26/2015    Mm Screening Breast W/implant Tomo Bilateral  Result Date: 07/01/2016 CLINICAL DATA:  Screening. EXAM: 2D DIGITAL SCREENING BILATERAL MAMMOGRAM WITH IMPLANTS, CAD AND ADJUNCT TOMO The patient has implants. Standard and implant displaced views were performed. COMPARISON:  Previous exam(s). ACR Breast Density Category b: There are scattered areas of fibroglandular density. FINDINGS: There are no findings suspicious for malignancy. Images were processed with CAD. IMPRESSION: No mammographic evidence of malignancy. A result letter of this screening mammogram will be mailed directly to the patient. RECOMMENDATION: Screening mammogram in one year. (Code:SM-B-01Y) BI-RADS CATEGORY  1:  Negative. Electronically Signed   By: Abelardo Diesel M.D.   On: 07/01/2016 12:43       Assessment & Plan:   Problem List Items Addressed This Visit    Hypercholesterolemia - Primary    Cholesterol elevated.  LDL on recent check 192.  Start crestor.  Follow lipid panel and liver function tests.        Relevant Medications   rosuvastatin (CRESTOR) 10 MG tablet   Other Relevant Orders   Hepatic function panel   Menstrual irregularity    Better off  ocp's.  Followed by gyn.        Stress    Increased stress.  She feels she is handling things relatively well.  On citalopram.  Follow.           Einar Pheasant, MD

## 2016-08-25 NOTE — Progress Notes (Signed)
Pre visit review using our clinic review tool, if applicable. No additional management support is needed unless otherwise documented below in the visit note. 

## 2016-08-25 NOTE — Assessment & Plan Note (Signed)
Cholesterol elevated.  LDL on recent check 192.  Start crestor.  Follow lipid panel and liver function tests.

## 2016-08-25 NOTE — Assessment & Plan Note (Signed)
Better off ocp's.  Followed by gyn.

## 2016-08-30 ENCOUNTER — Encounter: Payer: Self-pay | Admitting: Internal Medicine

## 2016-08-30 NOTE — Assessment & Plan Note (Signed)
Increased stress.  She feels she is handling things relatively well.  On citalopram.  Follow.

## 2016-10-05 ENCOUNTER — Other Ambulatory Visit: Payer: 59

## 2016-10-11 ENCOUNTER — Other Ambulatory Visit: Payer: Self-pay | Admitting: Internal Medicine

## 2016-10-12 ENCOUNTER — Other Ambulatory Visit: Payer: Self-pay | Admitting: Radiology

## 2016-10-12 ENCOUNTER — Other Ambulatory Visit (INDEPENDENT_AMBULATORY_CARE_PROVIDER_SITE_OTHER): Payer: 59

## 2016-10-12 DIAGNOSIS — E875 Hyperkalemia: Secondary | ICD-10-CM

## 2016-10-12 DIAGNOSIS — E7800 Pure hypercholesterolemia, unspecified: Secondary | ICD-10-CM

## 2016-10-12 LAB — HEPATIC FUNCTION PANEL
ALK PHOS: 55 U/L (ref 39–117)
ALT: 20 U/L (ref 0–35)
AST: 23 U/L (ref 0–37)
Albumin: 4.2 g/dL (ref 3.5–5.2)
BILIRUBIN DIRECT: 0.1 mg/dL (ref 0.0–0.3)
BILIRUBIN TOTAL: 0.6 mg/dL (ref 0.2–1.2)
TOTAL PROTEIN: 6.8 g/dL (ref 6.0–8.3)

## 2016-10-12 LAB — POTASSIUM: Potassium: 3.8 mEq/L (ref 3.5–5.1)

## 2016-10-13 ENCOUNTER — Other Ambulatory Visit: Payer: Self-pay | Admitting: Internal Medicine

## 2016-10-13 ENCOUNTER — Encounter: Payer: Self-pay | Admitting: *Deleted

## 2016-10-13 DIAGNOSIS — E7800 Pure hypercholesterolemia, unspecified: Secondary | ICD-10-CM

## 2016-10-13 NOTE — Progress Notes (Signed)
Order placed for f/u labs.  

## 2016-12-15 ENCOUNTER — Other Ambulatory Visit: Payer: Self-pay | Admitting: Internal Medicine

## 2016-12-20 ENCOUNTER — Other Ambulatory Visit (INDEPENDENT_AMBULATORY_CARE_PROVIDER_SITE_OTHER): Payer: 59

## 2016-12-20 DIAGNOSIS — E7800 Pure hypercholesterolemia, unspecified: Secondary | ICD-10-CM

## 2016-12-20 LAB — LIPID PANEL
CHOL/HDL RATIO: 3
Cholesterol: 170 mg/dL (ref 0–200)
HDL: 53.4 mg/dL (ref >39.00)
LDL Cholesterol: 101 mg/dL — ABNORMAL HIGH (ref 0–99)
NONHDL: 116.6
Triglycerides: 76 mg/dL (ref 0.0–149.0)
VLDL: 15.2 mg/dL (ref 0.0–40.0)

## 2016-12-20 LAB — COMPREHENSIVE METABOLIC PANEL
ALK PHOS: 53 U/L (ref 39–117)
ALT: 12 U/L (ref 0–35)
AST: 16 U/L (ref 0–37)
Albumin: 3.9 g/dL (ref 3.5–5.2)
BUN: 12 mg/dL (ref 6–23)
CHLORIDE: 107 meq/L (ref 96–112)
CO2: 29 meq/L (ref 19–32)
Calcium: 8.8 mg/dL (ref 8.4–10.5)
Creatinine, Ser: 0.77 mg/dL (ref 0.40–1.20)
GFR: 85.85 mL/min (ref >60.00)
Glucose, Bld: 89 mg/dL (ref 70–99)
Potassium: 4.5 mEq/L (ref 3.5–5.1)
SODIUM: 139 meq/L (ref 135–145)
Total Bilirubin: 0.5 mg/dL (ref 0.2–1.2)
Total Protein: 6 g/dL (ref 6.0–8.3)

## 2016-12-20 LAB — CBC WITH DIFFERENTIAL/PLATELET
BASOS ABS: 0 10*3/uL (ref 0.0–0.1)
Basophils Relative: 0.6 % (ref 0.0–3.0)
EOS ABS: 0.4 10*3/uL (ref 0.0–0.7)
Eosinophils Relative: 6.9 % — ABNORMAL HIGH (ref 0.0–5.0)
HEMATOCRIT: 40.3 % (ref 36.0–46.0)
Hemoglobin: 13.7 g/dL (ref 12.0–15.0)
LYMPHS PCT: 25.6 % (ref 12.0–46.0)
Lymphs Abs: 1.3 10*3/uL (ref 0.7–4.0)
MCHC: 34 g/dL (ref 30.0–36.0)
MCV: 88.3 fl (ref 78.0–100.0)
MONOS PCT: 6 % (ref 3.0–12.0)
Monocytes Absolute: 0.3 10*3/uL (ref 0.1–1.0)
NEUTROS ABS: 3.2 10*3/uL (ref 1.4–7.7)
Neutrophils Relative %: 60.9 % (ref 43.0–77.0)
PLATELETS: 302 10*3/uL (ref 150.0–400.0)
RBC: 4.56 Mil/uL (ref 3.87–5.11)
RDW: 12.2 % (ref 11.5–15.5)
WBC: 5.2 10*3/uL (ref 4.0–10.5)

## 2016-12-20 LAB — TSH: TSH: 1.57 u[IU]/mL (ref 0.35–4.50)

## 2016-12-29 ENCOUNTER — Encounter: Payer: Self-pay | Admitting: Internal Medicine

## 2016-12-29 ENCOUNTER — Ambulatory Visit (INDEPENDENT_AMBULATORY_CARE_PROVIDER_SITE_OTHER): Payer: 59 | Admitting: Internal Medicine

## 2016-12-29 DIAGNOSIS — F439 Reaction to severe stress, unspecified: Secondary | ICD-10-CM

## 2016-12-29 DIAGNOSIS — E7800 Pure hypercholesterolemia, unspecified: Secondary | ICD-10-CM | POA: Diagnosis not present

## 2016-12-29 MED ORDER — CITALOPRAM HYDROBROMIDE 40 MG PO TABS
40.0000 mg | ORAL_TABLET | Freq: Every day | ORAL | 3 refills | Status: DC
Start: 2016-12-29 — End: 2017-05-11

## 2016-12-29 MED ORDER — ROSUVASTATIN CALCIUM 10 MG PO TABS: 10.0000 mg | ORAL_TABLET | Freq: Every day | ORAL | 6 refills | Status: DC

## 2016-12-29 NOTE — Progress Notes (Signed)
Pre-visit discussion using our clinic review tool. No additional management support is needed unless otherwise documented below in the visit note.  

## 2016-12-29 NOTE — Progress Notes (Signed)
[name], female   DOB: 09/12/1971, 46 y.o.   MRN: [id number]   Subjective:    [name], female    DOB: 08-04-1971, 46 y.o.   MRN: [id number]  HPI  Patient here for a scheduled follow up.  She has been under increased stress.  Discussed with her today.  She has started taking an increased dose of citalopram - 2 weeks ago.  Started on 40mg  q day. Feels better on this dose.  Wants to remain on this dose for now.  No chest pain.  No sob.  No acid reflux.  No abdominal pain. Bowels moving.  No urine change.  Has gained weight.  Discussed diet and exercise.     Past Medical History:  Diagnosis Date  . Allergy   . Blood in stool    H/O  . History of chicken pox   . Hx: UTI (urinary tract infection)   . Hyperlipidemia    Past Surgical History:  Procedure Laterality Date  . AUGMENTATION MAMMAPLASTY  2001  . CESAREAN SECTION  2007 and 2009  . tummy tuck  2001   Family History  Problem Relation Age of Onset  . Healthy Mother   . Hypertension Mother   . Cancer Father   . Atrial fibrillation Father   . Heart disease Father   . Hypertension Father   . Arthritis Sister   . Asthma Sister   . Hypertension Sister   . Healthy Daughter   . Healthy Son   . Alzheimer's disease Maternal Grandmother   . Healthy Sister   . Arthritis Maternal Grandfather   . Breast cancer Neg Hx    Social History   Social History  . Marital status: Married    Spouse name: N/A  . Number of children: N/A  . Years of education: N/A   Social History Main Topics  . Smoking status: Never Smoker  . Smokeless tobacco: Never Used  . Alcohol use 0.0 oz/week     Comment: occasional  . Drug use: No  . Sexual activity: Not Asked   Other Topics Concern  . None   Social History Narrative  . None    Outpatient Encounter Prescriptions as of 12/29/2016  Medication Sig  . cetirizine (ZYRTEC) 10 MG tablet Take 10 mg by mouth daily.  . fluticasone (FLONASE) 50 MCG/ACT nasal  spray PLACE 1 SPRAY INTO BOTH NOSTRILS TWO TIMES DAILY  . ibuprofen (ADVIL,MOTRIN) 200 MG tablet Take by mouth.  . rosuvastatin (CRESTOR) 10 MG tablet Take 1 tablet (10 mg total) by mouth daily.  . [DISCONTINUED] citalopram (CELEXA) 20 MG tablet Take 1 tablet (20 mg total) by mouth daily.  . [DISCONTINUED] rosuvastatin (CRESTOR) 10 MG tablet Take 1 tablet (10 mg total) by mouth daily.  . citalopram (CELEXA) 40 MG tablet Take 1 tablet (40 mg total) by mouth daily.   No facility-administered encounter medications on file as of 12/29/2016.     Review of Systems  Constitutional: Negative for fever.       Has gained weight.    HENT: Negative for congestion and sinus pressure.   Respiratory: Negative for cough, chest tightness and shortness of breath.   Cardiovascular: Negative for chest pain, palpitations and leg swelling.  Gastrointestinal: Negative for abdominal pain, diarrhea, nausea and vomiting.  Genitourinary: Negative for difficulty urinating and dysuria.  Musculoskeletal: Negative for back pain and joint swelling.  Skin: Negative for color change and rash.  Neurological: Negative for dizziness,  light-headedness and headaches.  Psychiatric/Behavioral: Negative for agitation and dysphoric mood.       Increased stress as outlined.         Objective:    Physical Exam  Constitutional: She appears well-developed and well-nourished. No distress.  HENT:  Nose: Nose normal.  Mouth/Throat: Oropharynx is clear and moist.  Neck: Neck supple. No thyromegaly present.  Cardiovascular: Normal rate and regular rhythm.   Pulmonary/Chest: Breath sounds normal. No respiratory distress. She has no wheezes.  Abdominal: Soft. Bowel sounds are normal. There is no tenderness.  Musculoskeletal: She exhibits no edema or tenderness.  Lymphadenopathy:    She has no cervical adenopathy.  Skin: No rash noted. No erythema.  Psychiatric: She has a normal mood and affect. Her behavior is normal.    BP  120/78 (BP Location: Left Arm, Patient Position: Sitting, Cuff Size: Normal)   Pulse 68   Temp 99.1 F (37.3 C) (Oral)   Resp 12   Ht 5\' 3"  (1.6 m)   Wt 200 lb 12.8 oz (91.1 kg)   SpO2 97%   BMI 35.57 kg/m  Wt Readings from Last 3 Encounters:  12/29/16 200 lb 12.8 oz (91.1 kg)  08/25/16 191 lb 6.4 oz (86.8 kg)  05/26/16 188 lb 3.2 oz (85.4 kg)     Lab Results  Component Value Date   WBC 5.2 12/20/2016   HGB 13.7 12/20/2016   HCT 40.3 12/20/2016   PLT 302.0 12/20/2016   GLUCOSE 89 12/20/2016   CHOL 170 12/20/2016   TRIG 76.0 12/20/2016   HDL 53.40 12/20/2016   LDLCALC 101 (H) 12/20/2016   ALT 12 12/20/2016   AST 16 12/20/2016   NA 139 12/20/2016   K 4.5 12/20/2016   CL 107 12/20/2016   CREATININE 0.77 12/20/2016   BUN 12 12/20/2016   CO2 29 12/20/2016   TSH 1.57 12/20/2016    Mm Screening Breast W/implant Tomo Bilateral  Result Date: 07/01/2016 CLINICAL DATA:  Screening. EXAM: 2D DIGITAL SCREENING BILATERAL MAMMOGRAM WITH IMPLANTS, CAD AND ADJUNCT TOMO The patient has implants. Standard and implant displaced views were performed. COMPARISON:  Previous exam(s). ACR Breast Density Category b: There are scattered areas of fibroglandular density. FINDINGS: There are no findings suspicious for malignancy. Images were processed with CAD. IMPRESSION: No mammographic evidence of malignancy. A result letter of this screening mammogram will be mailed directly to the patient. RECOMMENDATION: Screening mammogram in one year. (Code:SM-B-01Y) BI-RADS CATEGORY  1:  Negative. Electronically Signed   By: Abelardo Diesel M.D.   On: 07/01/2016 12:43       Assessment & Plan:   Problem List Items Addressed This Visit    Hypercholesterolemia    On crestor.  Low cholesterol diet and exercise.  Follow lipid panel and liver function tests.   Lab Results  Component Value Date   CHOL 170 12/20/2016   HDL 53.40 12/20/2016   LDLCALC 101 (H) 12/20/2016   TRIG 76.0 12/20/2016   CHOLHDL 3  12/20/2016        Relevant Medications   rosuvastatin (CRESTOR) 10 MG tablet   Stress    Increased stress as outlined.  She is taking 40mg  citalopram now for the last two weeks.  Feels better on this dose.  Will continue.  Follow.            Einar Pheasant, MD

## 2017-01-02 ENCOUNTER — Encounter: Payer: Self-pay | Admitting: Internal Medicine

## 2017-01-02 NOTE — Assessment & Plan Note (Signed)
On crestor.  Low cholesterol diet and exercise.  Follow lipid panel and liver function tests.   Lab Results  Component Value Date   CHOL 170 12/20/2016   HDL 53.40 12/20/2016   LDLCALC 101 (H) 12/20/2016   TRIG 76.0 12/20/2016   CHOLHDL 3 12/20/2016

## 2017-01-02 NOTE — Assessment & Plan Note (Signed)
Increased stress as outlined.  She is taking 40mg  citalopram now for the last two weeks.  Feels better on this dose.  Will continue.  Follow.

## 2017-02-01 DIAGNOSIS — H6983 Other specified disorders of Eustachian tube, bilateral: Secondary | ICD-10-CM | POA: Diagnosis not present

## 2017-02-01 DIAGNOSIS — R42 Dizziness and giddiness: Secondary | ICD-10-CM | POA: Diagnosis not present

## 2017-04-13 ENCOUNTER — Encounter: Payer: Self-pay | Admitting: Internal Medicine

## 2017-04-13 ENCOUNTER — Ambulatory Visit (INDEPENDENT_AMBULATORY_CARE_PROVIDER_SITE_OTHER): Payer: 59 | Admitting: Internal Medicine

## 2017-04-13 ENCOUNTER — Telehealth: Payer: Self-pay | Admitting: Internal Medicine

## 2017-04-13 DIAGNOSIS — Z6836 Body mass index (BMI) 36.0-36.9, adult: Secondary | ICD-10-CM | POA: Diagnosis not present

## 2017-04-13 DIAGNOSIS — E7800 Pure hypercholesterolemia, unspecified: Secondary | ICD-10-CM

## 2017-04-13 DIAGNOSIS — F439 Reaction to severe stress, unspecified: Secondary | ICD-10-CM | POA: Diagnosis not present

## 2017-04-13 MED ORDER — HYDROCORTISONE ACE-PRAMOXINE 1-1 % RE CREA: 1.0000 "application " | TOPICAL_CREAM | Freq: Two times a day (BID) | RECTAL | 0 refills | Status: DC

## 2017-04-13 NOTE — Progress Notes (Signed)
Pre-visit discussion using our clinic review tool. No additional management support is needed unless otherwise documented below in the visit note.  

## 2017-04-13 NOTE — Telephone Encounter (Signed)
Pt called and stated that the cream that was called in was $115.00 (even for generic), is there something else that we could call in instead? Please advise, thank you!  Call pt @ [phone]

## 2017-04-13 NOTE — Progress Notes (Signed)
[name], female   DOB: 1970-10-19, 46 y.o.   MRN: [id number]   Subjective:    [name], female    DOB: Dec 03, 1970, 46 y.o.   MRN: [id number]  HPI  Patient here for a scheduled follow up.  She reports she is doing relatively well.  Increased stress.  She does feel she has been handling things relatively well.  Feels citalopram is working well.  Does not feel needs any further intervention.  Her weight has gone up.  She has not been watching what she eats.  Plans to get more serious about her diet.  Has started walking.  No chest pain.  No sob.  No acid reflux.  No abdominal pain.  Bowels moving.     Past Medical History:  Diagnosis Date  . Allergy   . Blood in stool    H/O  . History of chicken pox   . Hx: UTI (urinary tract infection)   . Hyperlipidemia    Past Surgical History:  Procedure Laterality Date  . AUGMENTATION MAMMAPLASTY  2001  . CESAREAN SECTION  2007 and 2009  . tummy tuck  2001   Family History  Problem Relation Age of Onset  . Healthy Mother   . Hypertension Mother   . Cancer Father   . Atrial fibrillation Father   . Heart disease Father   . Hypertension Father   . Arthritis Sister   . Asthma Sister   . Hypertension Sister   . Healthy Daughter   . Healthy Son   . Alzheimer's disease Maternal Grandmother   . Healthy Sister   . Arthritis Maternal Grandfather   . Breast cancer Neg Hx    Social History   Social History  . Marital status: Married    Spouse name: N/A  . Number of children: N/A  . Years of education: N/A   Social History Main Topics  . Smoking status: Never Smoker  . Smokeless tobacco: Never Used  . Alcohol use 0.0 oz/week     Comment: occasional  . Drug use: No  . Sexual activity: Not Asked   Other Topics Concern  . None   Social History Narrative  . None    Outpatient Encounter Prescriptions as of 04/13/2017  Medication Sig  . cetirizine (ZYRTEC) 10 MG tablet Take 10 mg by mouth daily.    . citalopram (CELEXA) 40 MG tablet Take 1 tablet (40 mg total) by mouth daily.  . fluticasone (FLONASE) 50 MCG/ACT nasal spray PLACE 1 SPRAY INTO BOTH NOSTRILS TWO TIMES DAILY  . ibuprofen (ADVIL,MOTRIN) 200 MG tablet Take by mouth.  . rosuvastatin (CRESTOR) 10 MG tablet Take 1 tablet (10 mg total) by mouth daily.  . pramoxine-hydrocortisone (ANALPRAM-HC) 1-1 % rectal cream Place 1 application rectally 2 (two) times daily.   No facility-administered encounter medications on file as of 04/13/2017.     Review of Systems  Constitutional: Negative for appetite change and unexpected weight change.  HENT: Negative for congestion and sinus pressure.   Respiratory: Negative for cough, chest tightness and shortness of breath.   Cardiovascular: Negative for chest pain, palpitations and leg swelling.  Gastrointestinal: Negative for abdominal pain, diarrhea, nausea and vomiting.  Genitourinary: Negative for difficulty urinating and dysuria.  Musculoskeletal: Negative for back pain and joint swelling.  Skin: Negative for color change and rash.  Neurological: Negative for dizziness, light-headedness and headaches.  Psychiatric/Behavioral: Negative for agitation and dysphoric mood.  Objective:     Blood pressure rechecked by me:  120/82  Physical Exam  Constitutional: She appears well-developed and well-nourished. No distress.  HENT:  Nose: Nose normal.  Mouth/Throat: Oropharynx is clear and moist.  Neck: Neck supple. No thyromegaly present.  Cardiovascular: Normal rate and regular rhythm.   Pulmonary/Chest: Breath sounds normal. No respiratory distress. She has no wheezes.  Abdominal: Soft. Bowel sounds are normal. There is no tenderness.  Musculoskeletal: She exhibits no edema or tenderness.  Lymphadenopathy:    She has no cervical adenopathy.  Skin: No rash noted. No erythema.  Psychiatric: She has a normal mood and affect. Her behavior is normal.    BP 126/72 (BP Location: Left  Arm, Patient Position: Sitting, Cuff Size: Normal)   Pulse 60   Temp 99 F (37.2 C) (Oral)   Resp 12   Ht 5\' 3"  (1.6 m)   Wt 206 lb 12.8 oz (93.8 kg)   SpO2 98%   BMI 36.63 kg/m  Wt Readings from Last 3 Encounters:  04/13/17 206 lb 12.8 oz (93.8 kg)  12/29/16 200 lb 12.8 oz (91.1 kg)  08/25/16 191 lb 6.4 oz (86.8 kg)     Lab Results  Component Value Date   WBC 5.2 12/20/2016   HGB 13.7 12/20/2016   HCT 40.3 12/20/2016   PLT 302.0 12/20/2016   GLUCOSE 89 12/20/2016   CHOL 170 12/20/2016   TRIG 76.0 12/20/2016   HDL 53.40 12/20/2016   LDLCALC 101 (H) 12/20/2016   ALT 12 12/20/2016   AST 16 12/20/2016   NA 139 12/20/2016   K 4.5 12/20/2016   CL 107 12/20/2016   CREATININE 0.77 12/20/2016   BUN 12 12/20/2016   CO2 29 12/20/2016   TSH 1.57 12/20/2016    Mm Screening Breast W/implant Tomo Bilateral  Result Date: 07/01/2016 CLINICAL DATA:  Screening. EXAM: 2D DIGITAL SCREENING BILATERAL MAMMOGRAM WITH IMPLANTS, CAD AND ADJUNCT TOMO The patient has implants. Standard and implant displaced views were performed. COMPARISON:  Previous exam(s). ACR Breast Density Category b: There are scattered areas of fibroglandular density. FINDINGS: There are no findings suspicious for malignancy. Images were processed with CAD. IMPRESSION: No mammographic evidence of malignancy. A result letter of this screening mammogram will be mailed directly to the patient. RECOMMENDATION: Screening mammogram in one year. (Code:SM-B-01Y) BI-RADS CATEGORY  1:  Negative. Electronically Signed   By: Abelardo Diesel M.D.   On: 07/01/2016 12:43       Assessment & Plan:   Problem List Items Addressed This Visit    BMI 36.0-36.9,adult    Discussed diet and exercise.  She plans to get more serious about her diet and exercise.  Has started walking.  Follow.       Hypercholesterolemia    Low cholesterol diet and exercise.  On creastor.  Plans to get more serious about her diet and exercise.  Follow lipid panel  and liver function tests.       Relevant Orders   Comprehensive metabolic panel   Lipid panel   Stress    Increased stress as outlined.  Feels citalopram is working well.  Does not feel needs anything more at this time.  Follow.           Einar Pheasant, MD

## 2017-04-14 NOTE — Telephone Encounter (Signed)
Checked with pharmacy - apparently cream with steroid in it  - all are expensive.  Suppositories - 6 for 70$ and 12 for 130$ without insurance.  Can send in and see how much is covered with insurance.  Can let me know if I need to send in new rx.

## 2017-04-14 NOTE — Telephone Encounter (Signed)
Please advise 

## 2017-04-14 NOTE — Telephone Encounter (Signed)
Left message to return call to our office.  

## 2017-04-14 NOTE — Telephone Encounter (Signed)
Called everything that it can be changed to cost more or is the same price.

## 2017-04-14 NOTE — Telephone Encounter (Signed)
Please call pharmacy and see what cheaper alternatives there are.

## 2017-04-15 NOTE — Telephone Encounter (Signed)
Left message to return call to our office.  

## 2017-04-16 ENCOUNTER — Encounter: Payer: Self-pay | Admitting: Internal Medicine

## 2017-04-16 DIAGNOSIS — Z6838 Body mass index (BMI) 38.0-38.9, adult: Secondary | ICD-10-CM | POA: Insufficient documentation

## 2017-04-16 NOTE — Assessment & Plan Note (Signed)
Discussed diet and exercise.  She plans to get more serious about her diet and exercise.  Has started walking.  Follow.

## 2017-04-16 NOTE — Assessment & Plan Note (Signed)
Low cholesterol diet and exercise.  On creastor.  Plans to get more serious about her diet and exercise.  Follow lipid panel and liver function tests.

## 2017-04-16 NOTE — Assessment & Plan Note (Signed)
Increased stress as outlined.  Feels citalopram is working well.  Does not feel needs anything more at this time.  Follow.

## 2017-04-18 MED ORDER — HYDROCORTISONE ACETATE 25 MG RE SUPP: 25.0000 mg | Freq: Two times a day (BID) | RECTAL | 0 refills | Status: DC

## 2017-04-18 NOTE — Telephone Encounter (Signed)
rx sent in for annusol [facility] suppositories.

## 2017-04-18 NOTE — Telephone Encounter (Signed)
Patient called back she would like to try that and see what the cost will be after insurance. If it is about the same price she will most likely just pay for cream. But would like to get script sent for the suppositories to check price.

## 2017-05-11 ENCOUNTER — Other Ambulatory Visit: Payer: Self-pay | Admitting: Internal Medicine

## 2017-05-26 ENCOUNTER — Ambulatory Visit (INDEPENDENT_AMBULATORY_CARE_PROVIDER_SITE_OTHER): Payer: 59

## 2017-05-26 DIAGNOSIS — Z23 Encounter for immunization: Secondary | ICD-10-CM

## 2017-05-30 ENCOUNTER — Other Ambulatory Visit: Payer: Self-pay | Admitting: Internal Medicine

## 2017-05-30 DIAGNOSIS — Z1231 Encounter for screening mammogram for malignant neoplasm of breast: Secondary | ICD-10-CM

## 2017-06-16 ENCOUNTER — Other Ambulatory Visit (INDEPENDENT_AMBULATORY_CARE_PROVIDER_SITE_OTHER): Payer: 59

## 2017-06-16 ENCOUNTER — Telehealth: Payer: Self-pay | Admitting: Internal Medicine

## 2017-06-16 DIAGNOSIS — E7800 Pure hypercholesterolemia, unspecified: Secondary | ICD-10-CM

## 2017-06-16 LAB — LIPID PANEL
CHOL/HDL RATIO: 3
Cholesterol: 172 mg/dL (ref 0–200)
HDL: 51.2 mg/dL (ref >39.00)
LDL CALC: 96 mg/dL (ref 0–99)
NONHDL: 120.66
Triglycerides: 125 mg/dL (ref 0.0–149.0)
VLDL: 25 mg/dL (ref 0.0–40.0)

## 2017-06-16 LAB — COMPREHENSIVE METABOLIC PANEL
ALBUMIN: 4.1 g/dL (ref 3.5–5.2)
ALT: 11 U/L (ref 0–35)
AST: 15 U/L (ref 0–37)
Alkaline Phosphatase: 58 U/L (ref 39–117)
BUN: 13 mg/dL (ref 6–23)
CHLORIDE: 105 meq/L (ref 96–112)
CO2: 27 meq/L (ref 19–32)
CREATININE: 0.76 mg/dL (ref 0.40–1.20)
Calcium: 8.8 mg/dL (ref 8.4–10.5)
GFR: 86.97 mL/min (ref >60.00)
GLUCOSE: 87 mg/dL (ref 70–99)
Potassium: 4.4 mEq/L (ref 3.5–5.1)
SODIUM: 137 meq/L (ref 135–145)
Total Bilirubin: 0.6 mg/dL (ref 0.2–1.2)
Total Protein: 6.3 g/dL (ref 6.0–8.3)

## 2017-06-16 NOTE — Telephone Encounter (Signed)
See lab note for information  °

## 2017-06-16 NOTE — Telephone Encounter (Signed)
Pt called back returning your call. Thank you! °

## 2017-06-16 NOTE — Telephone Encounter (Signed)
Pt called back returning your call. Please advise, thank you!  Call pt @ [phone]

## 2017-07-05 ENCOUNTER — Ambulatory Visit
Admission: RE | Admit: 2017-07-05 | Discharge: 2017-07-05 | Disposition: A | Discharge status: Home / Self Care | Payer: 59 | Source: Ambulatory Visit | Attending: Internal Medicine | Admitting: Internal Medicine

## 2017-07-05 DIAGNOSIS — Z1231 Encounter for screening mammogram for malignant neoplasm of breast: Secondary | ICD-10-CM | POA: Diagnosis not present

## 2017-08-12 ENCOUNTER — Telehealth: Payer: Self-pay | Admitting: Internal Medicine

## 2017-08-12 MED ORDER — CITALOPRAM HYDROBROMIDE 40 MG PO TABS: 40.0000 mg | ORAL_TABLET | Freq: Every day | ORAL | 2 refills | Status: DC

## 2017-08-12 MED ORDER — FLUTICASONE PROPIONATE 50 MCG/ACT NA SUSP: NASAL | 2 refills | Status: DC

## 2017-08-12 MED ORDER — ROSUVASTATIN CALCIUM 10 MG PO TABS: 10.0000 mg | ORAL_TABLET | Freq: Every day | ORAL | 2 refills | Status: DC

## 2017-08-12 NOTE — Telephone Encounter (Signed)
Copied from [place] [id number]. Topic: Quick Communication - See Telephone Encounter >> Aug 12, 2017 11:14 AM Corie Chiquito, NT wrote: CRM for notification. See Telephone encounter for: Patient needs a refill on her Crestor,Celexa, and Flonase  08/12/17.

## 2017-08-12 NOTE — Telephone Encounter (Signed)
Pt. requesting a refill on Crestor, Celexa, and Flonase.  Noted pt. Rescheduled office appt. from 08/17/17 to 10/28/17.  Called pt. Left voice message that the requested medications will be refilled, and encouraged to keep f/u appt. with Dr Nicki Reaper in February.

## 2017-08-17 ENCOUNTER — Ambulatory Visit: Payer: 59 | Admitting: Internal Medicine

## 2017-10-28 ENCOUNTER — Encounter: Payer: Self-pay | Admitting: Internal Medicine

## 2017-10-28 ENCOUNTER — Ambulatory Visit (INDEPENDENT_AMBULATORY_CARE_PROVIDER_SITE_OTHER): Payer: 59 | Admitting: Internal Medicine

## 2017-10-28 VITALS — BP 128/70 | HR 84 | Temp 99.1°F | Resp 16 | Wt 216.0 lb

## 2017-10-28 DIAGNOSIS — L989 Disorder of the skin and subcutaneous tissue, unspecified: Secondary | ICD-10-CM

## 2017-10-28 DIAGNOSIS — E7800 Pure hypercholesterolemia, unspecified: Secondary | ICD-10-CM

## 2017-10-28 DIAGNOSIS — J029 Acute pharyngitis, unspecified: Secondary | ICD-10-CM | POA: Diagnosis not present

## 2017-10-28 DIAGNOSIS — F439 Reaction to severe stress, unspecified: Secondary | ICD-10-CM | POA: Diagnosis not present

## 2017-10-28 DIAGNOSIS — Z6838 Body mass index (BMI) 38.0-38.9, adult: Secondary | ICD-10-CM

## 2017-10-28 LAB — POCT INFLUENZA A/B
INFLUENZA A, POC: NEGATIVE
Influenza B, POC: NEGATIVE

## 2017-10-28 LAB — POCT RAPID STREP A (OFFICE): RAPID STREP A SCREEN: NEGATIVE

## 2017-10-28 MED ORDER — ROSUVASTATIN CALCIUM 10 MG PO TABS: 10.0000 mg | ORAL_TABLET | Freq: Every day | ORAL | 2 refills | Status: DC

## 2017-10-28 MED ORDER — CITALOPRAM HYDROBROMIDE 40 MG PO TABS
40.0000 mg | ORAL_TABLET | Freq: Every day | ORAL | 2 refills | Status: DC
Start: 2017-10-28 — End: 2018-03-15

## 2017-10-28 MED ORDER — ALPRAZOLAM 0.25 MG PO TABS: 0.2500 mg | ORAL_TABLET | Freq: Every day | ORAL | 0 refills | Status: DC | PRN

## 2017-10-28 NOTE — Progress Notes (Signed)
[name], female   DOB: [birth year], 47 y.o.   MRN: [id number]   Subjective:    [name], female    DOB: 20-Apr-1971, 47 y.o.   MRN: [id number]  HPI  Patient here for a scheduled follow up.  She reports she has been under increased stress recently.  Overall she feels she is handling things relatively well.  Discussed with her today.  Does not feel she needs any further intervention.  On celexa. She does report having irritated throat previously.  No sore throat now.  Lost her voice.  Some sinus congestion in the am.  No aching.  No fever.  No chest congestion or cough.  No nausea or vomiting.  Bowels moving.  No urine change.  Using flonase.  Eating.  Discussed diet and exercise.  She plans to get more serious about her diet and exercise.  Persistent left leg lesion.  She has been picking the scab off. Is smaller - per pt.     Past Medical History:  Diagnosis Date  . Allergy   . Blood in stool    H/O  . History of chicken pox   . Hx: UTI (urinary tract infection)   . Hyperlipidemia    Past Surgical History:  Procedure Laterality Date  . AUGMENTATION MAMMAPLASTY  2001  . CESAREAN SECTION  2007 and 2009  . tummy tuck  2001   Family History  Problem Relation Age of Onset  . Healthy Mother   . Hypertension Mother   . Cancer Father   . Atrial fibrillation Father   . Heart disease Father   . Hypertension Father   . Arthritis Sister   . Asthma Sister   . Hypertension Sister   . Healthy Daughter   . Healthy Son   . Alzheimer's disease Maternal Grandmother   . Healthy Sister   . Arthritis Maternal Grandfather   . Breast cancer Neg Hx    Social History   Socioeconomic History  . Marital status: Married    Spouse name: None  . Number of children: None  . Years of education: None  . Highest education level: None  Social Needs  . Financial resource strain: None  . Food insecurity - worry: None  . Food insecurity - inability: None  .  Transportation needs - medical: None  . Transportation needs - non-medical: None  Occupational History  . None  Tobacco Use  . Smoking status: Never Smoker  . Smokeless tobacco: Never Used  Substance and Sexual Activity  . Alcohol use: Yes    Alcohol/week: 0.0 oz    Comment: occasional  . Drug use: No  . Sexual activity: None  Other Topics Concern  . None  Social History Narrative  . None    Outpatient Encounter Medications as of 10/28/2017  Medication Sig  . ALPRAZolam (XANAX) 0.25 MG tablet Take 1 tablet (0.25 mg total) by mouth daily as needed for anxiety.  . cetirizine (ZYRTEC) 10 MG tablet Take 10 mg by mouth daily.  . citalopram (CELEXA) 40 MG tablet Take 1 tablet (40 mg total) by mouth daily.  . fluticasone (FLONASE) 50 MCG/ACT nasal spray PLACE 1 SPRAY INTO BOTH NOSTRILS TWO TIMES DAILY  . hydrocortisone (ANUSOL-HC) 25 MG suppository Place 1 suppository (25 mg total) rectally 2 (two) times daily.  Marland Kitchen ibuprofen (ADVIL,MOTRIN) 200 MG tablet Take by mouth.  . pramoxine-hydrocortisone (ANALPRAM-HC) 1-1 % rectal cream Place 1 application rectally 2 (two) times daily.  Marland Kitchen  rosuvastatin (CRESTOR) 10 MG tablet Take 1 tablet (10 mg total) by mouth daily.  . [DISCONTINUED] citalopram (CELEXA) 40 MG tablet Take 1 tablet (40 mg total) by mouth daily.  . [DISCONTINUED] rosuvastatin (CRESTOR) 10 MG tablet Take 1 tablet (10 mg total) by mouth daily.   No facility-administered encounter medications on file as of 10/28/2017.     Review of Systems  Constitutional: Negative for appetite change and unexpected weight change.  HENT: Positive for congestion, postnasal drip and voice change. Negative for sinus pressure.   Respiratory: Negative for cough, chest tightness and shortness of breath.   Cardiovascular: Negative for chest pain, palpitations and leg swelling.  Gastrointestinal: Negative for abdominal pain, diarrhea, nausea and vomiting.  Genitourinary: Negative for difficulty urinating and  dysuria.  Musculoskeletal: Negative for joint swelling and myalgias.  Skin: Negative for color change and rash.  Neurological: Negative for dizziness, light-headedness and headaches.  Psychiatric/Behavioral: Negative for agitation and dysphoric mood.       Increased stress as outlined.         Objective:    Physical Exam  Constitutional: She appears well-developed and well-nourished. No distress.  HENT:  Nose: Nose normal.  Mouth/Throat: Oropharynx is clear and moist.  Neck: Neck supple. No thyromegaly present.  Cardiovascular: Normal rate and regular rhythm.  Pulmonary/Chest: Breath sounds normal. No respiratory distress. She has no wheezes.  Abdominal: Soft. Bowel sounds are normal. There is no tenderness.  Musculoskeletal: She exhibits no edema or tenderness.  Lymphadenopathy:    She has no cervical adenopathy.  Skin: No rash noted. No erythema.  Small lesion - left leg.  No surrounding erythema.    Psychiatric: She has a normal mood and affect. Her behavior is normal.    BP 128/70 (BP Location: Left Arm, Patient Position: Sitting, Cuff Size: Large)   Pulse 84   Temp 99.1 F (37.3 C) (Oral)   Resp 16   Wt 216 lb (98 kg)   SpO2 97%   BMI 38.26 kg/m  Wt Readings from Last 3 Encounters:  10/28/17 216 lb (98 kg)  04/13/17 206 lb 12.8 oz (93.8 kg)  12/29/16 200 lb 12.8 oz (91.1 kg)     Lab Results  Component Value Date   WBC 5.2 12/20/2016   HGB 13.7 12/20/2016   HCT 40.3 12/20/2016   PLT 302.0 12/20/2016   GLUCOSE 87 06/16/2017   CHOL 172 06/16/2017   TRIG 125.0 06/16/2017   HDL 51.20 06/16/2017   LDLCALC 96 06/16/2017   ALT 11 06/16/2017   AST 15 06/16/2017   NA 137 06/16/2017   K 4.4 06/16/2017   CL 105 06/16/2017   CREATININE 0.76 06/16/2017   BUN 13 06/16/2017   CO2 27 06/16/2017   TSH 1.57 12/20/2016    Mm Screening Breast W/implant Tomo Bilateral  Result Date: 07/05/2017 CLINICAL DATA:  Screening. EXAM: 2D DIGITAL SCREENING BILATERAL MAMMOGRAM  WITH IMPLANTS, CAD AND ADJUNCT TOMO The patient has bilateral retroglandular silicone implants. Standard and implant displaced views were performed. COMPARISON:  Previous exam(s). ACR Breast Density Category b: There are scattered areas of fibroglandular density. FINDINGS: There are no findings suspicious for malignancy. Images were processed with CAD. IMPRESSION: No mammographic evidence of malignancy. A result letter of this screening mammogram will be mailed directly to the patient. RECOMMENDATION: Screening mammogram in one year. (Code:SM-B-01Y) BI-RADS CATEGORY  1:  Negative. Electronically Signed   By: Everlean Alstrom M.D.   On: 07/05/2017 12:19       Assessment &  Plan:   Problem List Items Addressed This Visit    BMI 38.0-38.9,adult    Discussed diet and exercise.  Follow.        Hypercholesterolemia    On crestor.  Low cholesterol diet and exercise.  Follow lipid panel and liver function tests.        Relevant Medications   rosuvastatin (CRESTOR) 10 MG tablet   Other Relevant Orders   CBC with Differential/Platelet   Comprehensive metabolic panel   TSH   Lipid panel   Stress    Increased stress as outlined.  Discussed with her today.  She does not feel needs anything more at this time or any further intervention.  On celexa.  Follow.         Other Visit Diagnoses    Sore throat    -  Primary   Sore throat resolved.  Some congestion and laryngitis.  Rest voice.  Gargle.  Warm liquids.  Flonase.  Rapid strep and Flu swab (by nurse) - negative.     Relevant Orders   POCT rapid strep A (Completed)   POCT Influenza A/B (Completed)   Leg skin lesion, left       She has been picking.  Is smaller per her report.  avoid picking.  follow.  notify me if does not resolve.         Einar Pheasant, MD

## 2017-10-31 ENCOUNTER — Ambulatory Visit: Payer: Self-pay | Admitting: *Deleted

## 2017-10-31 ENCOUNTER — Telehealth: Payer: Self-pay | Admitting: Internal Medicine

## 2017-10-31 ENCOUNTER — Encounter: Payer: Self-pay | Admitting: Internal Medicine

## 2017-10-31 NOTE — Assessment & Plan Note (Signed)
Increased stress as outlined.  Discussed with her today.  She does not feel needs anything more at this time or any further intervention.  On celexa.  Follow.

## 2017-10-31 NOTE — Telephone Encounter (Signed)
Copied from [place]. Topic: Quick Communication - See Telephone Encounter >> Oct 31, 2017  9:20 AM Ether Griffins B wrote: CRM for notification. See Telephone encounter for:  Pt still experiencing symptoms of laryngitis she still has no voice and coughing fits at times, also is feeling tired and weak. Wondering if something else needs to be called in or how long they need to give it before things should improve.  10/31/17.

## 2017-10-31 NOTE — Telephone Encounter (Signed)
Please advise 

## 2017-10-31 NOTE — Assessment & Plan Note (Signed)
On crestor.  Low cholesterol diet and exercise.  Follow lipid panel and liver function tests.   

## 2017-10-31 NOTE — Assessment & Plan Note (Signed)
Discussed diet and exercise.  Follow.  

## 2017-10-31 NOTE — Telephone Encounter (Signed)
LMTCB. OK for PEC to speak with patient. Please triage. Need to know more info as far as new symptoms, etc.

## 2017-10-31 NOTE — Telephone Encounter (Signed)
Husband is calling because wife has no voice- she is not improving very much and needs to know if she needs follow up. main complaint is dry cough, no voice and fatigue. Patient is using OTC cough syrup- still has dry cough and no voice. No other symptoms reported. Is there anything else that she can do? Husband # [phone]  Reason for Disposition . Cough  Answer Assessment - Initial Assessment Questions 1. ONSET: "When did the cough begin?"      1 week or more 2. SEVERITY: "How bad is the cough today?"      About the same 3. RESPIRATORY DISTRESS: "Describe your breathing."      No problems with her breathing- patient is tired and fatigued 4. FEVER: "Do you have a fever?" If so, ask: "What is your temperature, how was it measured, and when did it start?"     no 5. HEMOPTYSIS: "Are you coughing up any blood?" If so ask: "How much?" (flecks, streaks, tablespoons, etc.)     no 6. TREATMENT: "What have you done so far to treat the cough?" (e.g., meds, fluids, humidifier)     delsym  7. CARDIAC HISTORY: "Do you have any history of heart disease?" (e.g., heart attack, congestive heart failure)      no 8. LUNG HISTORY: "Do you have any history of lung disease?"  (e.g., pulmonary embolus, asthma, emphysema)     no 9. PE RISK FACTORS: "Do you have a history of blood clots?" (or: recent major surgery, recent prolonged travel, bedridden )     no 10. OTHER SYMPTOMS: "Do you have any other symptoms? (e.g., runny nose, wheezing, chest pain)       Cough and laryngitis  11. PREGNANCY: "Is there any chance you are pregnant?" "When was your last menstrual period?"       No- vasectomy 12. TRAVEL: "Have you traveled out of the country in the last month?" (e.g., travel history, exposures)       no  Protocols used: COUGH - ACUTE NON-PRODUCTIVE-A-AH

## 2017-11-01 NOTE — Telephone Encounter (Signed)
If she is having persistent symptoms, I can reevaluate.  I can see her during lunch on Thursday (have her come in about 12:20 for work in appt).  Please on schedule for 12:30.  Thanks.  Let me know if she does not feel can wait that long.

## 2017-11-01 NOTE — Telephone Encounter (Signed)
See message below , patient seen on 10/28/17 . Please advise.

## 2017-11-01 NOTE — Telephone Encounter (Signed)
Patient finally has her voice back and says her cough has gotten better and is unable to come in on Thursday but if worse or not progressively better on Friday she will call Back.

## 2017-11-01 NOTE — Telephone Encounter (Signed)
Triage note forwarded to Dr. Nicki Reaper.

## 2017-11-08 MED ORDER — BENZONATATE 100 MG PO CAPS: 100.0000 mg | ORAL_CAPSULE | Freq: Three times a day (TID) | ORAL | 0 refills | Status: DC | PRN

## 2017-11-08 NOTE — Telephone Encounter (Signed)
Patient was dx with laryngitis last week and states that she feels much better but she has a persistent dry cough that is keeping her throat irritated. She is requesting something for cough. She denies any other symptoms.

## 2017-11-08 NOTE — Telephone Encounter (Signed)
Sent in rx for tessalon perles.  If persistent problems, will need to be reevaluated.

## 2017-11-08 NOTE — Telephone Encounter (Signed)
What is she taking now?  Has she tried tessalon perles?

## 2017-11-08 NOTE — Telephone Encounter (Signed)
Patient has been taking delsym and has not tried tessalon perles.

## 2017-11-08 NOTE — Telephone Encounter (Signed)
Left detailed message letting patient know that medication has been sent to pharmacy.

## 2017-11-08 NOTE — Telephone Encounter (Signed)
Pt called in to request to have a stronger cough medication be sent in to the pharmacy for her, pt says that she have a dry cough.  Pharmacy: [facility], [place]

## 2017-11-23 ENCOUNTER — Ambulatory Visit: Payer: Self-pay | Admitting: *Deleted

## 2017-11-23 DIAGNOSIS — R0781 Pleurodynia: Secondary | ICD-10-CM | POA: Diagnosis not present

## 2017-11-23 NOTE — Telephone Encounter (Signed)
Called in c/o  Right sided abd pain since this morning that is becoming worse.   Had nausea last night but none today.   BMs. Normal  Without being dark or bloody.  The tenderness in the right side is becoming progressively worse as the day progresses.   "I really want to be evaluated today instead of waiting and then it ending up being an emergency room visit in the middle of the night".  She is going to the [facility] now.    Reason for Disposition . [1] MILD-MODERATE pain AND [2] constant AND [3] present > 2 hours  Answer Assessment - Initial Assessment Questions 1. LOCATION: "Where does it hurt?"      Right side of abd and under the rib cage.   Tender to touch.   Started this morning.   Last night I had nausea.  Woke up this morning fine.   As the day has progressed the tenderness has become worse and spread. 2. RADIATION: "Does the pain shoot anywhere else?" (e.g., chest, back)     No.    3. ONSET: "When did the pain begin?" (e.g., minutes, hours or days ago)      This morning 4. SUDDEN: "Gradual or sudden onset?"     This morning was a small spot that was tender but over the course of the day it has become more tender and sore.   Feels like a bruise. 5. PATTERN "Does the pain come and go, or is it constant?"    - If constant: "Is it getting better, staying the same, or worsening?"      (Note: Constant means the pain never goes away completely; most serious pain is constant and it progresses)     - If intermittent: "How long does it last?" "Do you have pain now?"     (Note: Intermittent means the pain goes away completely between bouts)     Constant. 6. SEVERITY: "How bad is the pain?"  (e.g., Scale 1-10; mild, moderate, or severe)   - MILD (1-3): doesn't interfere with normal activities, abdomen soft and not tender to touch    - MODERATE (4-7): interferes with normal activities or awakens from sleep, tender to touch    - SEVERE (8-10): excruciating pain,  doubled over, unable to do any normal activities      3 on pain scale 7. RECURRENT SYMPTOM: "Have you ever had this type of abdominal pain before?" If so, ask: "When was the last time?" and "What happened that time?"      No    No GI problems. 8. CAUSE: "What do you think is causing the abdominal pain?"     Maybe appendix.    9. RELIEVING/AGGRAVATING FACTORS: "What makes it better or worse?" (e.g., movement, antacids, bowel movement)     More tender if I sit down or press on it.   I can feel it with a deep breath too. 10. OTHER SYMPTOMS: "Has there been any vomiting, diarrhea, constipation, or urine problems?"       Normal BMs 11. PREGNANCY: "Is there any chance you are pregnant?" "When was your last menstrual period?"       No.   My had a vasectomy.  Protocols used: ABDOMINAL PAIN [facility]

## 2017-11-23 NOTE — Telephone Encounter (Signed)
FYI

## 2017-11-24 ENCOUNTER — Telehealth: Payer: Self-pay | Admitting: Internal Medicine

## 2017-11-24 NOTE — Telephone Encounter (Signed)
If symptoms are improving,  I am ok with monitoring.  If she is having persistent increased pain, I can see her Wednesday at 12:30 for work in appt.  If she needs to be seen before let me know.

## 2017-11-24 NOTE — Telephone Encounter (Signed)
Patient was working in her moms apartment and woke up the next morning hurting in her right side. She stated the pain feels like a deep bruise and feels like something catches when she tries to move too fast. She was triaged yesterday and told to go to urgent care so she went to walk in and was evaluated. Patient stated they did x-ray/scans and they came back ok. She was just wondering if she should be concerned or just monitor. I advised patient since she had been evaluated, she could monitor pain and if worsens she needs to be re-evaluated. Patient stated she would be okay with that. She just wanted your opinion on what she should do.

## 2017-11-24 NOTE — Telephone Encounter (Signed)
Copied from [place] [id number]. Topic: Quick Communication - See Telephone Encounter >> Nov 24, 2017  8:53 AM Cleaster Corin, NT wrote: CRM for notification. See Telephone encounter for:   11/24/17. Pt. Calling to speak with nurse or Dr. Nicki Reaper about her hip pian. Wanting to let Dr. Gwyndolyn Kaufman that she went to urgent care and nothing was found, but she is still in pain. Pt. Would like a callback [phone]

## 2017-11-25 NOTE — Telephone Encounter (Signed)
Noted.  Please call later and check on pt and confirm doing ok.  See previous note for work in next week.

## 2017-11-25 NOTE — Telephone Encounter (Signed)
Patient stated yesterday afternoon that she felt comfortable monitoring overnight and calling me today if symptoms worsen. Will hold msg in case work in is needed. Spoke with you about this yesterday afternoon as well.

## 2017-11-29 NOTE — Telephone Encounter (Signed)
LMTCB

## 2017-12-23 ENCOUNTER — Other Ambulatory Visit (INDEPENDENT_AMBULATORY_CARE_PROVIDER_SITE_OTHER): Payer: 59

## 2017-12-23 DIAGNOSIS — E7800 Pure hypercholesterolemia, unspecified: Secondary | ICD-10-CM

## 2017-12-23 LAB — COMPREHENSIVE METABOLIC PANEL
ALBUMIN: 3.9 g/dL (ref 3.5–5.2)
ALT: 15 U/L (ref 0–35)
AST: 18 U/L (ref 0–37)
Alkaline Phosphatase: 66 U/L (ref 39–117)
BUN: 15 mg/dL (ref 6–23)
CALCIUM: 8.6 mg/dL (ref 8.4–10.5)
CHLORIDE: 106 meq/L (ref 96–112)
CO2: 27 mEq/L (ref 19–32)
CREATININE: 0.84 mg/dL (ref 0.40–1.20)
GFR: 77.31 mL/min (ref >60.00)
Glucose, Bld: 84 mg/dL (ref 70–99)
POTASSIUM: 4.2 meq/L (ref 3.5–5.1)
Sodium: 139 mEq/L (ref 135–145)
Total Bilirubin: 0.5 mg/dL (ref 0.2–1.2)
Total Protein: 6.4 g/dL (ref 6.0–8.3)

## 2017-12-23 LAB — CBC WITH DIFFERENTIAL/PLATELET
Basophils Absolute: 0 10*3/uL (ref 0.0–0.1)
Basophils Relative: 0.6 % (ref 0.0–3.0)
EOS ABS: 0.4 10*3/uL (ref 0.0–0.7)
Eosinophils Relative: 7.6 % — ABNORMAL HIGH (ref 0.0–5.0)
HEMATOCRIT: 39.4 % (ref 36.0–46.0)
HEMOGLOBIN: 13.5 g/dL (ref 12.0–15.0)
LYMPHS PCT: 25.3 % (ref 12.0–46.0)
Lymphs Abs: 1.4 10*3/uL (ref 0.7–4.0)
MCHC: 34.4 g/dL (ref 30.0–36.0)
MCV: 87.1 fl (ref 78.0–100.0)
MONOS PCT: 7.1 % (ref 3.0–12.0)
Monocytes Absolute: 0.4 10*3/uL (ref 0.1–1.0)
Neutro Abs: 3.3 10*3/uL (ref 1.4–7.7)
Neutrophils Relative %: 59.4 % (ref 43.0–77.0)
Platelets: 306 10*3/uL (ref 150.0–400.0)
RBC: 4.52 Mil/uL (ref 3.87–5.11)
RDW: 12.5 % (ref 11.5–15.5)
WBC: 5.5 10*3/uL (ref 4.0–10.5)

## 2017-12-23 LAB — LIPID PANEL
CHOL/HDL RATIO: 3
Cholesterol: 169 mg/dL (ref 0–200)
HDL: 49.6 mg/dL (ref >39.00)
LDL CALC: 94 mg/dL (ref 0–99)
NonHDL: 119.11
Triglycerides: 126 mg/dL (ref 0.0–149.0)
VLDL: 25.2 mg/dL (ref 0.0–40.0)

## 2017-12-23 LAB — TSH: TSH: 1.81 u[IU]/mL (ref 0.35–4.50)

## 2017-12-26 ENCOUNTER — Encounter: Payer: Self-pay | Admitting: *Deleted

## 2017-12-27 ENCOUNTER — Ambulatory Visit (INDEPENDENT_AMBULATORY_CARE_PROVIDER_SITE_OTHER): Payer: 59 | Admitting: Internal Medicine

## 2017-12-27 DIAGNOSIS — R030 Elevated blood-pressure reading, without diagnosis of hypertension: Secondary | ICD-10-CM | POA: Diagnosis not present

## 2017-12-27 DIAGNOSIS — F439 Reaction to severe stress, unspecified: Secondary | ICD-10-CM

## 2017-12-27 DIAGNOSIS — Z6838 Body mass index (BMI) 38.0-38.9, adult: Secondary | ICD-10-CM | POA: Diagnosis not present

## 2017-12-27 MED ORDER — ALPRAZOLAM 0.25 MG PO TABS: 0.2500 mg | ORAL_TABLET | Freq: Every day | ORAL | 1 refills | Status: DC | PRN

## 2017-12-27 NOTE — Progress Notes (Signed)
[name], female   DOB: Jul 15, 1971, 47 y.o.   MRN: [id number]   Subjective:    [name], female    DOB: 1971/05/02, 47 y.o.   MRN: [id number]  HPI  Patient here for a scheduled follow up.  She reports she is doing relatively well.  Increased stress.  Her mother moved in with her 11/26/17.  She is going through a transition period now.  Has been stressful.  Overall she feels she is handling things relatively well. Does not feel needs any further intervention.  On celexa.  Takes xanax prn.  Trying to stay active.  No chest pain.  No sob.  No acid reflux.  No abdominal pain.  Bowels moving.     Past Medical History:  Diagnosis Date  . Allergy   . Blood in stool    H/O  . History of chicken pox   . Hx: UTI (urinary tract infection)   . Hyperlipidemia    Past Surgical History:  Procedure Laterality Date  . AUGMENTATION MAMMAPLASTY  2001  . CESAREAN SECTION  2007 and 2009  . tummy tuck  2001   Family History  Problem Relation Age of Onset  . Healthy Mother   . Hypertension Mother   . Cancer Father   . Atrial fibrillation Father   . Heart disease Father   . Hypertension Father   . Arthritis Sister   . Asthma Sister   . Hypertension Sister   . Healthy Daughter   . Healthy Son   . Alzheimer's disease Maternal Grandmother   . Healthy Sister   . Arthritis Maternal Grandfather   . Breast cancer Neg Hx    Social History   Socioeconomic History  . Marital status: Married    Spouse name: Not on file  . Number of children: Not on file  . Years of education: Not on file  . Highest education level: Not on file  Occupational History  . Not on file  Social Needs  . Financial resource strain: Not on file  . Food insecurity:    Worry: Not on file    Inability: Not on file  . Transportation needs:    Medical: Not on file    Non-medical: Not on file  Tobacco Use  . Smoking status: Never Smoker  . Smokeless tobacco: Never Used  Substance and  Sexual Activity  . Alcohol use: Yes    Alcohol/week: 0.0 oz    Comment: occasional  . Drug use: No  . Sexual activity: Not on file  Lifestyle  . Physical activity:    Days per week: Not on file    Minutes per session: Not on file  . Stress: Not on file  Relationships  . Social connections:    Talks on phone: Not on file    Gets together: Not on file    Attends religious service: Not on file    Active member of club or organization: Not on file    Attends meetings of clubs or organizations: Not on file    Relationship status: Not on file  Other Topics Concern  . Not on file  Social History Narrative  . Not on file    Outpatient Encounter Medications as of 12/27/2017  Medication Sig  . ALPRAZolam (XANAX) 0.25 MG tablet Take 1 tablet (0.25 mg total) by mouth daily as needed for anxiety.  . cetirizine (ZYRTEC) 10 MG tablet Take 10 mg by mouth daily.  . citalopram (  CELEXA) 40 MG tablet Take 1 tablet (40 mg total) by mouth daily.  . fluticasone (FLONASE) 50 MCG/ACT nasal spray PLACE 1 SPRAY INTO BOTH NOSTRILS TWO TIMES DAILY  . ibuprofen (ADVIL,MOTRIN) 200 MG tablet Take by mouth.  . rosuvastatin (CRESTOR) 10 MG tablet Take 1 tablet (10 mg total) by mouth daily.  . [DISCONTINUED] ALPRAZolam (XANAX) 0.25 MG tablet Take 1 tablet (0.25 mg total) by mouth daily as needed for anxiety.  . hydrocortisone (ANUSOL-HC) 25 MG suppository Place 1 suppository (25 mg total) rectally 2 (two) times daily. (Patient not taking: Reported on 12/27/2017)  . pramoxine-hydrocortisone (ANALPRAM-HC) 1-1 % rectal cream Place 1 application rectally 2 (two) times daily. (Patient not taking: Reported on 12/27/2017)  . [DISCONTINUED] benzonatate (TESSALON) 100 MG capsule Take 1 capsule (100 mg total) by mouth 3 (three) times daily as needed for cough.   No facility-administered encounter medications on file as of 12/27/2017.     Review of Systems  Constitutional: Negative for appetite change and unexpected weight  change.  HENT: Negative for congestion and sinus pressure.   Respiratory: Negative for cough, chest tightness and shortness of breath.   Cardiovascular: Negative for chest pain, palpitations and leg swelling.  Gastrointestinal: Negative for abdominal pain, diarrhea, nausea and vomiting.  Genitourinary: Negative for difficulty urinating and dysuria.  Musculoskeletal: Negative for joint swelling and myalgias.  Skin: Negative for color change and rash.  Neurological: Negative for dizziness, light-headedness and headaches.  Psychiatric/Behavioral: Negative for agitation and dysphoric mood.       Objective:    Physical Exam  Constitutional: She appears well-developed and well-nourished. No distress.  HENT:  Nose: Nose normal.  Mouth/Throat: Oropharynx is clear and moist.  Neck: Neck supple. No thyromegaly present.  Cardiovascular: Normal rate and regular rhythm.  Pulmonary/Chest: Breath sounds normal. No respiratory distress. She has no wheezes.  Abdominal: Soft. Bowel sounds are normal. There is no tenderness.  Musculoskeletal: She exhibits no edema or tenderness.  Lymphadenopathy:    She has no cervical adenopathy.  Skin: No rash noted. No erythema.  Psychiatric: She has a normal mood and affect. Her behavior is normal.    BP 136/88 (BP Location: Left Arm, Patient Position: Sitting, Cuff Size: Normal)   Pulse 81   Temp 98.7 F (37.1 C) (Oral)   Resp 18   Wt 217 lb (98.4 kg)   SpO2 96%   BMI 38.44 kg/m  Wt Readings from Last 3 Encounters:  12/27/17 217 lb (98.4 kg)  10/28/17 216 lb (98 kg)  04/13/17 206 lb 12.8 oz (93.8 kg)     Lab Results  Component Value Date   WBC 5.5 12/23/2017   HGB 13.5 12/23/2017   HCT 39.4 12/23/2017   PLT 306.0 12/23/2017   GLUCOSE 84 12/23/2017   CHOL 169 12/23/2017   TRIG 126.0 12/23/2017   HDL 49.60 12/23/2017   LDLCALC 94 12/23/2017   ALT 15 12/23/2017   AST 18 12/23/2017   NA 139 12/23/2017   K 4.2 12/23/2017   CL 106 12/23/2017    CREATININE 0.84 12/23/2017   BUN 15 12/23/2017   CO2 27 12/23/2017   TSH 1.81 12/23/2017    Mm Screening Breast W/implant Tomo Bilateral  Result Date: 07/05/2017 CLINICAL DATA:  Screening. EXAM: 2D DIGITAL SCREENING BILATERAL MAMMOGRAM WITH IMPLANTS, CAD AND ADJUNCT TOMO The patient has bilateral retroglandular silicone implants. Standard and implant displaced views were performed. COMPARISON:  Previous exam(s). ACR Breast Density Category b: There are scattered areas of fibroglandular density.  FINDINGS: There are no findings suspicious for malignancy. Images were processed with CAD. IMPRESSION: No mammographic evidence of malignancy. A result letter of this screening mammogram will be mailed directly to the patient. RECOMMENDATION: Screening mammogram in one year. (Code:SM-B-01Y) BI-RADS CATEGORY  1:  Negative. Electronically Signed   By: Everlean Alstrom M.D.   On: 07/05/2017 12:19       Assessment & Plan:   Problem List Items Addressed This Visit    BMI 38.0-38.9,adult    Discussed diet and exercise.  Follow.        Elevated blood pressure reading    Blood pressure slightly elevated.  Discussed with her today.  She will spot check her pressure.  Get her back in soon to reassess.        Stress    Increased stress as outlined.  Discussed with her today.  On lexapro. Takes xanax prn.  Does not feel she needs anything more at this time. Follow.            Einar Pheasant, MD

## 2017-12-31 ENCOUNTER — Encounter: Payer: Self-pay | Admitting: Internal Medicine

## 2017-12-31 DIAGNOSIS — R030 Elevated blood-pressure reading, without diagnosis of hypertension: Secondary | ICD-10-CM | POA: Insufficient documentation

## 2017-12-31 NOTE — Assessment & Plan Note (Signed)
Blood pressure slightly elevated.  Discussed with her today.  She will spot check her pressure.  Get her back in soon to reassess.

## 2017-12-31 NOTE — Assessment & Plan Note (Signed)
Increased stress as outlined.  Discussed with her today.  On lexapro. Takes xanax prn.  Does not feel she needs anything more at this time. Follow.

## 2017-12-31 NOTE — Assessment & Plan Note (Signed)
Discussed diet and exercise.  Follow.  

## 2018-03-15 ENCOUNTER — Other Ambulatory Visit: Payer: Self-pay | Admitting: Internal Medicine

## 2018-03-17 ENCOUNTER — Ambulatory Visit (INDEPENDENT_AMBULATORY_CARE_PROVIDER_SITE_OTHER): Payer: 59 | Admitting: Family

## 2018-03-17 ENCOUNTER — Encounter: Payer: Self-pay | Admitting: Family

## 2018-03-17 VITALS — BP 130/82 | HR 77 | Temp 99.1°F | Ht 63.0 in | Wt 220.0 lb

## 2018-03-17 DIAGNOSIS — Z0000 Encounter for general adult medical examination without abnormal findings: Secondary | ICD-10-CM | POA: Diagnosis not present

## 2018-03-17 DIAGNOSIS — F439 Reaction to severe stress, unspecified: Secondary | ICD-10-CM | POA: Diagnosis not present

## 2018-03-17 NOTE — Progress Notes (Signed)
Subjective:    [name], female    DOB: [birth year], 47 y.o.   MRN: [id number]  CC: [name] is a 47 y.o. female who presents today for physical exam.    HPI:  Celexa and xanax working well for her. Feeling well. 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   Follows with Dr Nicki Reaper.     Colorectal Cancer Screening: no early family history Breast Cancer Screening: Mammogram UTD Cervical Cancer Screening: UTD; Done with OB GYN Bone Health screening/DEXA for 65+: No increased fracture risk. Defer screening at this time Lung Cancer Screening: Doesn't have 30 year pack year history and age > 52 years. Immunizations       Tetanus - utd         HIV Screening- Candidate for , declines Labs: Screening labs done with pcp.  Exercise: Gets regular exercise.  Alcohol use: occasional Smoking/tobacco use: Nonsmoker.  Wears seat belt: Yes. Skin: mole on left side of leg.   HISTORY:  Past Medical History:  Diagnosis Date  . Allergy   . Blood in stool    H/O  . History of chicken pox   . Hx: UTI (urinary tract infection)   . Hyperlipidemia     Past Surgical History:  Procedure Laterality Date  . AUGMENTATION MAMMAPLASTY  2001  . CESAREAN SECTION  2007 and 2009  . tummy tuck  2001   Family History  Problem Relation Age of Onset  . Healthy Mother   . Hypertension Mother   . Cancer Father        non hogkins lymphoma  . Atrial fibrillation Father   . Heart disease Father   . Hypertension Father   . Arthritis Sister   . Asthma Sister   . Hypertension Sister   . Healthy Daughter   . Healthy Son   . Alzheimer's disease Maternal Grandmother   . Healthy Sister   . Arthritis Maternal Grandfather   . Colon cancer Paternal Uncle 83  . Breast cancer Neg Hx       ALLERGIES: Patient has no known allergies.  Current Outpatient Medications on File Prior to Visit  Medication Sig Dispense Refill  . ALPRAZolam (XANAX) 0.25 MG tablet Take 1 tablet (0.25 mg  total) by mouth daily as needed for anxiety. 30 tablet 1  . cetirizine (ZYRTEC) 10 MG tablet Take 10 mg by mouth daily.    . citalopram (CELEXA) 40 MG tablet TAKE 1 TABLET BY MOUTH DAILY 30 tablet 2  . fluticasone (FLONASE) 50 MCG/ACT nasal spray PLACE 1 SPRAY INTO BOTH NOSTRILS TWO TIMES DAILY 16 g 2  . ibuprofen (ADVIL,MOTRIN) 200 MG tablet Take by mouth.    . pramoxine-hydrocortisone (ANALPRAM-HC) 1-1 % rectal cream Place 1 application rectally 2 (two) times daily. 30 g 0  . rosuvastatin (CRESTOR) 10 MG tablet TAKE 1 TABLET BY MOUTH DAILY. 30 tablet 2   No current facility-administered medications on file prior to visit.     Social History   Tobacco Use  . Smoking status: Never Smoker  . Smokeless tobacco: Never Used  Substance Use Topics  . Alcohol use: Yes    Alcohol/week: 0.0 oz    Comment: occasional  . Drug use: No    Review of Systems  Constitutional: Negative for chills, fever and unexpected weight change.  HENT: Negative for congestion.   Respiratory: Negative for cough.   Cardiovascular: Negative for chest pain, palpitations and leg swelling.  Gastrointestinal: Negative for nausea  and vomiting.  Musculoskeletal: Negative for arthralgias and myalgias.  Skin: Negative for rash.  Neurological: Negative for headaches.  Hematological: Negative for adenopathy.  Psychiatric/Behavioral: Negative for confusion. The patient is not nervous/anxious (stable).       Objective:    BP 130/82 (BP Location: Left Arm, Patient Position: Sitting, Cuff Size: Large)   Pulse 77   Temp 99.1 F (37.3 C) (Oral)   Ht 5\' 3"  (1.6 m)   Wt 220 lb (99.8 kg)   SpO2 96%   BMI 38.97 kg/m   BP Readings from Last 3 Encounters:  03/17/18 130/82  12/27/17 136/88  10/28/17 128/70   Wt Readings from Last 3 Encounters:  03/17/18 220 lb (99.8 kg)  12/27/17 217 lb (98.4 kg)  10/28/17 216 lb (98 kg)    Physical Exam  Constitutional: She appears well-developed and well-nourished.  Eyes:  Conjunctivae are normal.  Neck: No thyroid mass and no thyromegaly present.  Cardiovascular: Normal rate, regular rhythm, normal heart sounds and normal pulses.  Pulmonary/Chest: Effort normal and breath sounds normal. She has no wheezes. She has no rhonchi. She has no rales. Right breast exhibits no inverted nipple, no mass, no nipple discharge, no skin change and no tenderness. Left breast exhibits no inverted nipple, no mass, no nipple discharge, no skin change and no tenderness. Breasts are symmetrical.  CBE performed.   Lymphadenopathy:       Head (right side): No submental, no submandibular, no tonsillar, no preauricular, no posterior auricular and no occipital adenopathy present.       Head (left side): No submental, no submandibular, no tonsillar, no preauricular, no posterior auricular and no occipital adenopathy present.    She has no cervical adenopathy.       Right cervical: No superficial cervical, no deep cervical and no posterior cervical adenopathy present.      Left cervical: No superficial cervical, no deep cervical and no posterior cervical adenopathy present.    She has no axillary adenopathy.  Neurological: She is alert.  Skin: Skin is warm and dry.  Psychiatric: She has a normal mood and affect. Her speech is normal and behavior is normal. Thought content normal.  Vitals reviewed.      Assessment & Plan:   Problem List Items Addressed This Visit      Other   Stress    Stable. Advised to continue following with pcp      Routine physical examination - Primary    CBE performed. Patient declines pelvic exam in the absence of complaints. Pap is also UTD.      Relevant Orders   Ambulatory referral to Dermatology       I have discontinued Kevia R. Casebier's hydrocortisone. I am also having her maintain her ibuprofen, cetirizine, pramoxine-hydrocortisone, fluticasone, ALPRAZolam, rosuvastatin, and citalopram.   No orders of the defined types were placed in this  encounter.   Return precautions given.   Risks, benefits, and alternatives of the medications and treatment plan prescribed today were discussed, and patient expressed understanding.   Education regarding symptom management and diagnosis given to patient on AVS.   Continue to follow with Einar Pheasant, MD for routine health maintenance.   [name] and I agreed with plan.   Mable Paris, FNP

## 2018-03-17 NOTE — Patient Instructions (Signed)
Pleasure meeting you  Health Maintenance, Female Adopting a healthy lifestyle and getting preventive care can go a long way to promote health and wellness. Talk with your health care provider about what schedule of regular examinations is right for you. This is a good chance for you to check in with your provider about disease prevention and staying healthy. In between checkups, there are plenty of things you can do on your own. Experts have done a lot of research about which lifestyle changes and preventive measures are most likely to keep you healthy. Ask your health care provider for more information. Weight and diet Eat a healthy diet  Be sure to include plenty of vegetables, fruits, low-fat dairy products, and lean protein.  Do not eat a lot of foods high in solid fats, added sugars, or salt.  Get regular exercise. This is one of the most important things you can do for your health. ? Most adults should exercise for at least 150 minutes each week. The exercise should increase your heart rate and make you sweat (moderate-intensity exercise). ? Most adults should also do strengthening exercises at least twice a week. This is in addition to the moderate-intensity exercise.  Maintain a healthy weight  Body mass index (BMI) is a measurement that can be used to identify possible weight problems. It estimates body fat based on height and weight. Your health care provider can help determine your BMI and help you achieve or maintain a healthy weight.  For females 80 years of age and older: ? A BMI below 18.5 is considered underweight. ? A BMI of 18.5 to 24.9 is normal. ? A BMI of 25 to 29.9 is considered overweight. ? A BMI of 30 and above is considered obese.  Watch levels of cholesterol and blood lipids  You should start having your blood tested for lipids and cholesterol at 47 years of age, then have this test every 5 years.  You may need to have your cholesterol levels checked more  often if: ? Your lipid or cholesterol levels are high. ? You are older than 47 years of age. ? You are at high risk for heart disease.  Cancer screening Lung Cancer  Lung cancer screening is recommended for adults 88-70 years old who are at high risk for lung cancer because of a history of smoking.  A yearly low-dose CT scan of the lungs is recommended for people who: ? Currently smoke. ? Have quit within the past 15 years. ? Have at least a 30-pack-year history of smoking. A pack year is smoking an average of one pack of cigarettes a day for 1 year.  Yearly screening should continue until it has been 15 years since you quit.  Yearly screening should stop if you develop a health problem that would prevent you from having lung cancer treatment.  Breast Cancer  Practice breast self-awareness. This means understanding how your breasts normally appear and feel.  It also means doing regular breast self-exams. Let your health care provider know about any changes, no matter how small.  If you are in your 20s or 30s, you should have a clinical breast exam (CBE) by a health care provider every 1-3 years as part of a regular health exam.  If you are 74 or older, have a CBE every year. Also consider having a breast X-ray (mammogram) every year.  If you have a family history of breast cancer, talk to your health care provider about genetic screening.  If you  are at high risk for breast cancer, talk to your health care provider about having an MRI and a mammogram every year.  Breast cancer gene (BRCA) assessment is recommended for women who have family members with BRCA-related cancers. BRCA-related cancers include: ? Breast. ? Ovarian. ? Tubal. ? Peritoneal cancers.  Results of the assessment will determine the need for genetic counseling and BRCA1 and BRCA2 testing.  Cervical Cancer Your health care provider may recommend that you be screened regularly for cancer of the pelvic organs  (ovaries, uterus, and vagina). This screening involves a pelvic examination, including checking for microscopic changes to the surface of your cervix (Pap test). You may be encouraged to have this screening done every 3 years, beginning at age 72.  For women ages 67-65, health care providers may recommend pelvic exams and Pap testing every 3 years, or they may recommend the Pap and pelvic exam, combined with testing for human papilloma virus (HPV), every 5 years. Some types of HPV increase your risk of cervical cancer. Testing for HPV may also be done on women of any age with unclear Pap test results.  Other health care providers may not recommend any screening for nonpregnant women who are considered low risk for pelvic cancer and who do not have symptoms. Ask your health care provider if a screening pelvic exam is right for you.  If you have had past treatment for cervical cancer or a condition that could lead to cancer, you need Pap tests and screening for cancer for at least 20 years after your treatment. If Pap tests have been discontinued, your risk factors (such as having a new sexual partner) need to be reassessed to determine if screening should resume. Some women have medical problems that increase the chance of getting cervical cancer. In these cases, your health care provider may recommend more frequent screening and Pap tests.  Colorectal Cancer  This type of cancer can be detected and often prevented.  Routine colorectal cancer screening usually begins at 47 years of age and continues through 47 years of age.  Your health care provider may recommend screening at an earlier age if you have risk factors for colon cancer.  Your health care provider may also recommend using home test kits to check for hidden blood in the stool.  A small camera at the end of a tube can be used to examine your colon directly (sigmoidoscopy or colonoscopy). This is done to check for the earliest forms of  colorectal cancer.  Routine screening usually begins at age 74.  Direct examination of the colon should be repeated every 5-10 years through 47 years of age. However, you may need to be screened more often if early forms of precancerous polyps or small growths are found.  Skin Cancer  Check your skin from head to toe regularly.  Tell your health care provider about any new moles or changes in moles, especially if there is a change in a mole's shape or color.  Also tell your health care provider if you have a mole that is larger than the size of a pencil eraser.  Always use sunscreen. Apply sunscreen liberally and repeatedly throughout the day.  Protect yourself by wearing long sleeves, pants, a wide-brimmed hat, and sunglasses whenever you are outside.  Heart disease, diabetes, and high blood pressure  High blood pressure causes heart disease and increases the risk of stroke. High blood pressure is more likely to develop in: ? People who have blood pressure in  the high end of the normal range (130-139/85-89 mm Hg). ? People who are overweight or obese. ? People who are African American.  If you are 18-39 years of age, have your blood pressure checked every 3-5 years. If you are 40 years of age or older, have your blood pressure checked every year. You should have your blood pressure measured twice-once when you are at a hospital or clinic, and once when you are not at a hospital or clinic. Record the average of the two measurements. To check your blood pressure when you are not at a hospital or clinic, you can use: ? An automated blood pressure machine at a pharmacy. ? A home blood pressure monitor.  If you are between 55 years and 79 years old, ask your health care provider if you should take aspirin to prevent strokes.  Have regular diabetes screenings. This involves taking a blood sample to check your fasting blood sugar level. ? If you are at a normal weight and have a low risk  for diabetes, have this test once every three years after 47 years of age. ? If you are overweight and have a high risk for diabetes, consider being tested at a younger age or more often. Preventing infection Hepatitis B  If you have a higher risk for hepatitis B, you should be screened for this virus. You are considered at high risk for hepatitis B if: ? You were born in a country where hepatitis B is common. Ask your health care provider which countries are considered high risk. ? Your parents were born in a high-risk country, and you have not been immunized against hepatitis B (hepatitis B vaccine). ? You have HIV or AIDS. ? You use needles to inject street drugs. ? You live with someone who has hepatitis B. ? You have had sex with someone who has hepatitis B. ? You get hemodialysis treatment. ? You take certain medicines for conditions, including cancer, organ transplantation, and autoimmune conditions.  Hepatitis C  Blood testing is recommended for: ? Everyone born from 1945 through 1965. ? Anyone with known risk factors for hepatitis C.  Sexually transmitted infections (STIs)  You should be screened for sexually transmitted infections (STIs) including gonorrhea and chlamydia if: ? You are sexually active and are younger than 47 years of age. ? You are older than 47 years of age and your health care provider tells you that you are at risk for this type of infection. ? Your sexual activity has changed since you were last screened and you are at an increased risk for chlamydia or gonorrhea. Ask your health care provider if you are at risk.  If you do not have HIV, but are at risk, it may be recommended that you take a prescription medicine daily to prevent HIV infection. This is called pre-exposure prophylaxis (PrEP). You are considered at risk if: ? You are sexually active and do not regularly use condoms or know the HIV status of your partner(s). ? You take drugs by  injection. ? You are sexually active with a partner who has HIV.  Talk with your health care provider about whether you are at high risk of being infected with HIV. If you choose to begin PrEP, you should first be tested for HIV. You should then be tested every 3 months for as long as you are taking PrEP. Pregnancy  If you are premenopausal and you may become pregnant, ask your health care provider about preconception counseling.    If you may become pregnant, take 400 to 800 micrograms (mcg) of folic acid every day.  If you want to prevent pregnancy, talk to your health care provider about birth control (contraception). Osteoporosis and menopause  Osteoporosis is a disease in which the bones lose minerals and strength with aging. This can result in serious bone fractures. Your risk for osteoporosis can be identified using a bone density scan.  If you are 17 years of age or older, or if you are at risk for osteoporosis and fractures, ask your health care provider if you should be screened.  Ask your health care provider whether you should take a calcium or vitamin D supplement to lower your risk for osteoporosis.  Menopause may have certain physical symptoms and risks.  Hormone replacement therapy may reduce some of these symptoms and risks. Talk to your health care provider about whether hormone replacement therapy is right for you. Follow these instructions at home:  Schedule regular health, dental, and eye exams.  Stay current with your immunizations.  Do not use any tobacco products including cigarettes, chewing tobacco, or electronic cigarettes.  If you are pregnant, do not drink alcohol.  If you are breastfeeding, limit how much and how often you drink alcohol.  Limit alcohol intake to no more than 1 drink per day for nonpregnant women. One drink equals 12 ounces of beer, 5 ounces of wine, or 1 ounces of hard liquor.  Do not use street drugs.  Do not share needles.  Ask  your health care provider for help if you need support or information about quitting drugs.  Tell your health care provider if you often feel depressed.  Tell your health care provider if you have ever been abused or do not feel safe at home. This information is not intended to replace advice given to you by your health care provider. Make sure you discuss any questions you have with your health care provider. Document Released: 03/22/2011 Document Revised: 02/12/2016 Document Reviewed: 06/10/2015 Elsevier Interactive Patient Education  [employer].

## 2018-03-17 NOTE — Assessment & Plan Note (Signed)
CBE performed. Patient declines pelvic exam in the absence of complaints. Pap is also UTD.

## 2018-03-17 NOTE — Assessment & Plan Note (Signed)
Stable. Advised to continue following with pcp

## 2018-04-18 ENCOUNTER — Ambulatory Visit (INDEPENDENT_AMBULATORY_CARE_PROVIDER_SITE_OTHER): Payer: 59 | Admitting: Internal Medicine

## 2018-04-18 VITALS — BP 128/78 | HR 77 | Temp 98.8°F | Resp 18 | Wt 218.4 lb

## 2018-04-18 DIAGNOSIS — E7800 Pure hypercholesterolemia, unspecified: Secondary | ICD-10-CM

## 2018-04-18 DIAGNOSIS — R030 Elevated blood-pressure reading, without diagnosis of hypertension: Secondary | ICD-10-CM | POA: Diagnosis not present

## 2018-04-18 DIAGNOSIS — F439 Reaction to severe stress, unspecified: Secondary | ICD-10-CM

## 2018-04-18 DIAGNOSIS — Z1239 Encounter for other screening for malignant neoplasm of breast: Secondary | ICD-10-CM

## 2018-04-18 DIAGNOSIS — Z1231 Encounter for screening mammogram for malignant neoplasm of breast: Secondary | ICD-10-CM

## 2018-04-18 MED ORDER — ALPRAZOLAM 0.25 MG PO TABS: 0.2500 mg | ORAL_TABLET | Freq: Every day | ORAL | 1 refills | Status: DC | PRN

## 2018-04-18 MED ORDER — FLUTICASONE PROPIONATE 50 MCG/ACT NA SUSP: NASAL | 2 refills | Status: DC

## 2018-04-18 NOTE — Progress Notes (Signed)
[name], female   DOB: 11/26/1970, 47 y.o.   MRN: [id number]   Subjective:    [name], female    DOB: 27-Dec-1970, 47 y.o.   MRN: [id number]  HPI  Patient here for a scheduled follow up.  She reports she is doing relatively well.  Increased stress with her mother's health issues.  Her mother is living in an attached apartment.  Discussed with her today.  She overall feels she is handling things relatively well.  Does not feel needs any further intervention.  No chest pain.  No sob.  No acid reflux.  No abdominal pain.  Bowels moving.     Past Medical History:  Diagnosis Date  . Allergy   . Blood in stool    H/O  . History of chicken pox   . Hx: UTI (urinary tract infection)   . Hyperlipidemia    Past Surgical History:  Procedure Laterality Date  . AUGMENTATION MAMMAPLASTY  2001  . CESAREAN SECTION  2007 and 2009  . tummy tuck  2001   Family History  Problem Relation Age of Onset  . Healthy Mother   . Hypertension Mother   . Cancer Father        non hogkins lymphoma  . Atrial fibrillation Father   . Heart disease Father   . Hypertension Father   . Arthritis Sister   . Asthma Sister   . Hypertension Sister   . Healthy Daughter   . Healthy Son   . Alzheimer's disease Maternal Grandmother   . Healthy Sister   . Arthritis Maternal Grandfather   . Colon cancer Paternal Uncle 50  . Breast cancer Neg Hx    Social History   Socioeconomic History  . Marital status: Married    Spouse name: Not on file  . Number of children: Not on file  . Years of education: Not on file  . Highest education level: Not on file  Occupational History  . Not on file  Social Needs  . Financial resource strain: Not on file  . Food insecurity:    Worry: Not on file    Inability: Not on file  . Transportation needs:    Medical: Not on file    Non-medical: Not on file  Tobacco Use  . Smoking status: Never Smoker  . Smokeless tobacco: Never Used    Substance and Sexual Activity  . Alcohol use: Yes    Alcohol/week: 0.0 oz    Comment: occasional  . Drug use: No  . Sexual activity: Not on file  Lifestyle  . Physical activity:    Days per week: Not on file    Minutes per session: Not on file  . Stress: Not on file  Relationships  . Social connections:    Talks on phone: Not on file    Gets together: Not on file    Attends religious service: Not on file    Active member of club or organization: Not on file    Attends meetings of clubs or organizations: Not on file    Relationship status: Not on file  Other Topics Concern  . Not on file  Social History Narrative  . Not on file    Outpatient Encounter Medications as of 04/18/2018  Medication Sig  . ALPRAZolam (XANAX) 0.25 MG tablet Take 1 tablet (0.25 mg total) by mouth daily as needed for anxiety.  . cetirizine (ZYRTEC) 10 MG tablet Take 10 mg by mouth  daily.  . citalopram (CELEXA) 40 MG tablet TAKE 1 TABLET BY MOUTH DAILY  . fluticasone (FLONASE) 50 MCG/ACT nasal spray PLACE 1 SPRAY INTO BOTH NOSTRILS TWO TIMES DAILY  . ibuprofen (ADVIL,MOTRIN) 200 MG tablet Take by mouth.  . pramoxine-hydrocortisone (ANALPRAM-HC) 1-1 % rectal cream Place 1 application rectally 2 (two) times daily.  . rosuvastatin (CRESTOR) 10 MG tablet TAKE 1 TABLET BY MOUTH DAILY.  . [DISCONTINUED] ALPRAZolam (XANAX) 0.25 MG tablet Take 1 tablet (0.25 mg total) by mouth daily as needed for anxiety.  . [DISCONTINUED] fluticasone (FLONASE) 50 MCG/ACT nasal spray PLACE 1 SPRAY INTO BOTH NOSTRILS TWO TIMES DAILY   No facility-administered encounter medications on file as of 04/18/2018.     Review of Systems  Constitutional: Negative for appetite change and unexpected weight change.  HENT: Negative for congestion and sinus pressure.   Respiratory: Negative for cough, chest tightness and shortness of breath.   Cardiovascular: Negative for chest pain, palpitations and leg swelling.  Gastrointestinal: Negative  for abdominal pain, diarrhea, nausea and vomiting.  Genitourinary: Negative for difficulty urinating and dysuria.  Musculoskeletal: Negative for joint swelling and myalgias.  Skin: Negative for color change and rash.  Neurological: Negative for dizziness, light-headedness and headaches.  Psychiatric/Behavioral: Negative for agitation and dysphoric mood.       Increased stress as outlined.         Objective:    Physical Exam  Constitutional: She appears well-developed and well-nourished. No distress.  HENT:  Nose: Nose normal.  Mouth/Throat: Oropharynx is clear and moist.  Neck: Neck supple. No thyromegaly present.  Cardiovascular: Normal rate and regular rhythm.  Pulmonary/Chest: Breath sounds normal. No respiratory distress. She has no wheezes.  Abdominal: Soft. Bowel sounds are normal. There is no tenderness.  Musculoskeletal: She exhibits no edema or tenderness.  Lymphadenopathy:    She has no cervical adenopathy.  Skin: No rash noted. No erythema.  Psychiatric: She has a normal mood and affect. Her behavior is normal.    BP 128/78 (BP Location: Left Arm, Patient Position: Sitting, Cuff Size: Large)   Pulse 77   Temp 98.8 F (37.1 C) (Oral)   Resp 18   Wt 218 lb 6.4 oz (99.1 kg)   SpO2 97%   BMI 38.69 kg/m  Wt Readings from Last 3 Encounters:  04/18/18 218 lb 6.4 oz (99.1 kg)  03/17/18 220 lb (99.8 kg)  12/27/17 217 lb (98.4 kg)     Lab Results  Component Value Date   WBC 5.5 12/23/2017   HGB 13.5 12/23/2017   HCT 39.4 12/23/2017   PLT 306.0 12/23/2017   GLUCOSE 84 12/23/2017   CHOL 169 12/23/2017   TRIG 126.0 12/23/2017   HDL 49.60 12/23/2017   LDLCALC 94 12/23/2017   ALT 15 12/23/2017   AST 18 12/23/2017   NA 139 12/23/2017   K 4.2 12/23/2017   CL 106 12/23/2017   CREATININE 0.84 12/23/2017   BUN 15 12/23/2017   CO2 27 12/23/2017   TSH 1.81 12/23/2017    Mm Screening Breast W/implant Tomo Bilateral  Result Date: 07/05/2017 CLINICAL DATA:   Screening. EXAM: 2D DIGITAL SCREENING BILATERAL MAMMOGRAM WITH IMPLANTS, CAD AND ADJUNCT TOMO The patient has bilateral retroglandular silicone implants. Standard and implant displaced views were performed. COMPARISON:  Previous exam(s). ACR Breast Density Category b: There are scattered areas of fibroglandular density. FINDINGS: There are no findings suspicious for malignancy. Images were processed with CAD. IMPRESSION: No mammographic evidence of malignancy. A result letter of this  screening mammogram will be mailed directly to the patient. RECOMMENDATION: Screening mammogram in one year. (Code:SM-B-01Y) BI-RADS CATEGORY  1:  Negative. Electronically Signed   By: Everlean Alstrom M.D.   On: 07/05/2017 12:19       Assessment & Plan:   Problem List Items Addressed This Visit    Elevated blood pressure reading    Blood pressure as outlined.  Follow.        Hypercholesterolemia    On crestor.  Low cholesterol diet and exercise.  Follow lipid panel and liver function tests.        Stress    Increased stress as outlined.  On citalopram.  Discussed with her today.  Desires no further intervention.  Follow.         Other Visit Diagnoses    Breast cancer screening    -  Primary   Relevant Orders   MM 3D SCREEN BREAST BILATERAL       Einar Pheasant, MD

## 2018-04-19 ENCOUNTER — Other Ambulatory Visit: Payer: Self-pay | Admitting: Internal Medicine

## 2018-04-22 ENCOUNTER — Encounter: Payer: Self-pay | Admitting: Internal Medicine

## 2018-04-22 NOTE — Assessment & Plan Note (Signed)
On crestor.  Low cholesterol diet and exercise.  Follow lipid panel and liver function tests.   

## 2018-04-22 NOTE — Assessment & Plan Note (Signed)
Blood pressure as outlined.  Follow.   

## 2018-04-22 NOTE — Assessment & Plan Note (Signed)
Increased stress as outlined.  On citalopram.  Discussed with her today.  Desires no further intervention.  Follow.

## 2018-05-23 ENCOUNTER — Encounter: Payer: Self-pay | Admitting: Internal Medicine

## 2018-06-19 ENCOUNTER — Other Ambulatory Visit: Payer: Self-pay | Admitting: Internal Medicine

## 2018-07-04 ENCOUNTER — Other Ambulatory Visit: Payer: Self-pay | Admitting: Internal Medicine

## 2018-07-11 ENCOUNTER — Other Ambulatory Visit: Payer: Self-pay | Admitting: Internal Medicine

## 2018-07-12 ENCOUNTER — Ambulatory Visit
Admission: RE | Admit: 2018-07-12 | Discharge: 2018-07-12 | Disposition: A | Discharge status: Home / Self Care | Payer: 59 | Source: Ambulatory Visit | Attending: Internal Medicine | Admitting: Internal Medicine

## 2018-07-12 ENCOUNTER — Encounter (HOSPITAL_COMMUNITY): Payer: Self-pay

## 2018-07-12 DIAGNOSIS — Z1239 Encounter for other screening for malignant neoplasm of breast: Secondary | ICD-10-CM | POA: Diagnosis not present

## 2018-07-12 DIAGNOSIS — Z1231 Encounter for screening mammogram for malignant neoplasm of breast: Secondary | ICD-10-CM | POA: Diagnosis not present

## 2018-07-13 NOTE — Telephone Encounter (Signed)
Last OV 04/18/2018 Next OV 08/02/18 Last refill 04/18/2018

## 2018-07-14 NOTE — Telephone Encounter (Signed)
rx ok'd for xanax #30 with no refills.   

## 2018-08-02 ENCOUNTER — Ambulatory Visit (INDEPENDENT_AMBULATORY_CARE_PROVIDER_SITE_OTHER): Payer: 59 | Admitting: Internal Medicine

## 2018-08-02 DIAGNOSIS — Z9109 Other allergy status, other than to drugs and biological substances: Secondary | ICD-10-CM

## 2018-08-02 DIAGNOSIS — Z23 Encounter for immunization: Secondary | ICD-10-CM | POA: Diagnosis not present

## 2018-08-02 DIAGNOSIS — E7800 Pure hypercholesterolemia, unspecified: Secondary | ICD-10-CM | POA: Diagnosis not present

## 2018-08-02 DIAGNOSIS — F439 Reaction to severe stress, unspecified: Secondary | ICD-10-CM | POA: Diagnosis not present

## 2018-08-02 DIAGNOSIS — Z6838 Body mass index (BMI) 38.0-38.9, adult: Secondary | ICD-10-CM

## 2018-08-02 DIAGNOSIS — K6289 Other specified diseases of anus and rectum: Secondary | ICD-10-CM

## 2018-08-02 MED ORDER — NYSTATIN 100000 UNIT/GM EX CREA: 1.0000 "application " | TOPICAL_CREAM | Freq: Two times a day (BID) | CUTANEOUS | 0 refills | Status: DC

## 2018-08-02 NOTE — Progress Notes (Signed)
[name], female   DOB: May 04, 1971, 47 y.o.   MRN: [id number]   Subjective:    [name], female    DOB: 02/05/1971, 47 y.o.   MRN: [id number]  HPI  Patient here for a scheduled follow up.  Increased stress with her mother's health issues.  She just recently moved her mother into assisted living.  Stress related to this.  Discussed with her.  Feels handling things relatively well.  Medication helping.  Current dose ok.  Main complaint is that of rectal irritation.  States has hemorrhoids.  Not sure if a flare with hemorrhoid.  Around her rectum - feels inflamed.  Itches.  Notices streaks of blood occasionally on the tissue.  Bowels are moving. No constipation.  Tried hemorrhoid cream.  Aggravated. No chest pain.  Breathing stable.  No acid reflux.  No nausea or vomiting.     Past Medical History:  Diagnosis Date  . Allergy   . Blood in stool    H/O  . History of chicken pox   . Hx: UTI (urinary tract infection)   . Hyperlipidemia    Past Surgical History:  Procedure Laterality Date  . AUGMENTATION MAMMAPLASTY  2001  . CESAREAN SECTION  2007 and 2009  . tummy tuck  2001   Family History  Problem Relation Age of Onset  . Healthy Mother   . Hypertension Mother   . Cancer Father        non hogkins lymphoma  . Atrial fibrillation Father   . Heart disease Father   . Hypertension Father   . Arthritis Sister   . Asthma Sister   . Hypertension Sister   . Healthy Daughter   . Healthy Son   . Alzheimer's disease Maternal Grandmother   . Healthy Sister   . Arthritis Maternal Grandfather   . Colon cancer Paternal Uncle 75  . Breast cancer Neg Hx    Social History   Socioeconomic History  . Marital status: Married    Spouse name: Not on file  . Number of children: Not on file  . Years of education: Not on file  . Highest education level: Not on file  Occupational History  . Not on file  Social Needs  . Financial resource strain: Not on file   . Food insecurity:    Worry: Not on file    Inability: Not on file  . Transportation needs:    Medical: Not on file    Non-medical: Not on file  Tobacco Use  . Smoking status: Never Smoker  . Smokeless tobacco: Never Used  Substance and Sexual Activity  . Alcohol use: Yes    Alcohol/week: 0.0 standard drinks    Comment: occasional  . Drug use: No  . Sexual activity: Not on file  Lifestyle  . Physical activity:    Days per week: Not on file    Minutes per session: Not on file  . Stress: Not on file  Relationships  . Social connections:    Talks on phone: Not on file    Gets together: Not on file    Attends religious service: Not on file    Active member of club or organization: Not on file    Attends meetings of clubs or organizations: Not on file    Relationship status: Not on file  Other Topics Concern  . Not on file  Social History Narrative  . Not on file    Outpatient Encounter  Medications as of 08/02/2018  Medication Sig  . ALPRAZolam (XANAX) 0.25 MG tablet TAKE 1 TABLET BY MOUTH ONCE DAILY AS NEEDED FOR ANXIETY  . cetirizine (ZYRTEC) 10 MG tablet Take 10 mg by mouth daily.  . citalopram (CELEXA) 40 MG tablet TAKE 1 TABLET BY MOUTH DAILY  . fluticasone (FLONASE) 50 MCG/ACT nasal spray PLACE 1 SPRAY INTO BOTH NOSTRILS TWO TIMES DAILY  . ibuprofen (ADVIL,MOTRIN) 200 MG tablet Take by mouth.  . nystatin cream (MYCOSTATIN) Apply 1 application topically 2 (two) times daily.  . pramoxine-hydrocortisone (ANALPRAM-HC) 1-1 % rectal cream Place 1 application rectally 2 (two) times daily.  . rosuvastatin (CRESTOR) 10 MG tablet TAKE 1 TABLET BY MOUTH DAILY.   No facility-administered encounter medications on file as of 08/02/2018.     Review of Systems  Constitutional: Negative for appetite change and unexpected weight change.  HENT: Negative for congestion and sinus pressure.   Respiratory: Negative for cough, chest tightness and shortness of breath.   Cardiovascular:  Negative for chest pain, palpitations and leg swelling.  Gastrointestinal: Negative for abdominal pain, diarrhea, nausea and vomiting.       Perirectal irritation.    Genitourinary: Negative for difficulty urinating and dysuria.  Musculoskeletal: Negative for joint swelling and myalgias.  Skin: Negative for color change and wound.  Neurological: Negative for dizziness, light-headedness and headaches.  Psychiatric/Behavioral: Negative for agitation and dysphoric mood.       Increased stress as outlined.         Objective:    Physical Exam  Constitutional: She appears well-developed and well-nourished. No distress.  HENT:  Nose: Nose normal.  Mouth/Throat: Oropharynx is clear and moist.  Neck: Neck supple. No thyromegaly present.  Cardiovascular: Normal rate and regular rhythm.  Pulmonary/Chest: Breath sounds normal. No respiratory distress. She has no wheezes.  Abdominal: Soft. Bowel sounds are normal. There is no tenderness.  Genitourinary:  Genitourinary Comments: Increased erythema - perirectal area.  Skin cracking with irritation peri rectal area.  Question of skin tag.  No thrombosed hemorrhoid.  Rectal exam - heme negative, could not appreciate internal hemorrhoid.    Musculoskeletal: She exhibits no edema or tenderness.  Lymphadenopathy:    She has no cervical adenopathy.  Psychiatric: She has a normal mood and affect. Her behavior is normal.    BP 132/80 (BP Location: Left Arm, Patient Position: Sitting, Cuff Size: Normal)   Pulse 83   Temp 99 F (37.2 C) (Oral)   Resp 18   Wt 217 lb 12.8 oz (98.8 kg)   SpO2 98%   BMI 38.58 kg/m  Wt Readings from Last 3 Encounters:  08/02/18 217 lb 12.8 oz (98.8 kg)  04/18/18 218 lb 6.4 oz (99.1 kg)  03/17/18 220 lb (99.8 kg)     Lab Results  Component Value Date   WBC 5.5 12/23/2017   HGB 13.5 12/23/2017   HCT 39.4 12/23/2017   PLT 306.0 12/23/2017   GLUCOSE 84 12/23/2017   CHOL 169 12/23/2017   TRIG 126.0 12/23/2017    HDL 49.60 12/23/2017   LDLCALC 94 12/23/2017   ALT 15 12/23/2017   AST 18 12/23/2017   NA 139 12/23/2017   K 4.2 12/23/2017   CL 106 12/23/2017   CREATININE 0.84 12/23/2017   BUN 15 12/23/2017   CO2 27 12/23/2017   TSH 1.81 12/23/2017    Mm 3d Screen Breast W/implant Bilateral  Result Date: 07/12/2018 CLINICAL DATA:  Screening. EXAM: DIGITAL SCREENING BILATERAL MAMMOGRAM WITH IMPLANTS, CAD AND TOMO  The patient has implants. Standard and implant displaced views were performed. COMPARISON:  Previous exam(s). ACR Breast Density Category b: There are scattered areas of fibroglandular density. FINDINGS: There are no findings suspicious for malignancy. Images were processed with CAD. IMPRESSION: No mammographic evidence of malignancy. A result letter of this screening mammogram will be mailed directly to the patient. RECOMMENDATION: Screening mammogram in one year. (Code:SM-B-01Y) BI-RADS CATEGORY  1:  Negative. Electronically Signed   By: Abelardo Diesel M.D.   On: 07/12/2018 11:28       Assessment & Plan:   Problem List Items Addressed This Visit    BMI 38.0-38.9,adult    Discussed diet, exercise and weight loss.  Follow.        Environmental allergies    Stable on current regimen.  Follow.        Hypercholesterolemia    On crestor.  Low cholesterol diet and exercise.  Follow lipid panel and liver function tests.        Relevant Orders   Comprehensive metabolic panel   Lipid panel   Rectal pain    Peri rectal pain and irritation as outlined.  No evidence of thrombosed hemorrhoid.  Nystatin cream as directed.  Follow.  Call with update.  May need gyn evaluation.        Stress    Increased stress as outlined.  Discussed with her today.  Doing well on celexa at current dose.  Follow.         Other Visit Diagnoses    Need for immunization against influenza       Relevant Orders   Flu Vaccine QUAD 36+ mos IM (Completed)       Einar Pheasant, MD

## 2018-08-05 ENCOUNTER — Encounter: Payer: Self-pay | Admitting: Internal Medicine

## 2018-08-05 DIAGNOSIS — K6289 Other specified diseases of anus and rectum: Secondary | ICD-10-CM | POA: Insufficient documentation

## 2018-08-05 NOTE — Assessment & Plan Note (Signed)
Stable on current regimen.  Follow.   

## 2018-08-05 NOTE — Assessment & Plan Note (Signed)
Peri rectal pain and irritation as outlined.  No evidence of thrombosed hemorrhoid.  Nystatin cream as directed.  Follow.  Call with update.  May need gyn evaluation.

## 2018-08-05 NOTE — Assessment & Plan Note (Signed)
Discussed diet, exercise and weight loss.  Follow.    

## 2018-08-05 NOTE — Assessment & Plan Note (Signed)
Increased stress as outlined.  Discussed with her today.  Doing well on celexa at current dose.  Follow.

## 2018-08-05 NOTE — Assessment & Plan Note (Signed)
On crestor.  Low cholesterol diet and exercise.  Follow lipid panel and liver function tests.   

## 2018-08-10 ENCOUNTER — Other Ambulatory Visit (INDEPENDENT_AMBULATORY_CARE_PROVIDER_SITE_OTHER): Payer: 59

## 2018-08-10 DIAGNOSIS — E7800 Pure hypercholesterolemia, unspecified: Secondary | ICD-10-CM | POA: Diagnosis not present

## 2018-08-10 LAB — LIPID PANEL
CHOL/HDL RATIO: 4
Cholesterol: 175 mg/dL (ref 0–200)
HDL: 47.8 mg/dL (ref >39.00)
LDL CALC: 99 mg/dL (ref 0–99)
NONHDL: 127.53
Triglycerides: 142 mg/dL (ref 0.0–149.0)
VLDL: 28.4 mg/dL (ref 0.0–40.0)

## 2018-08-10 LAB — COMPREHENSIVE METABOLIC PANEL
ALT: 15 U/L (ref 0–35)
AST: 18 U/L (ref 0–37)
Albumin: 4.1 g/dL (ref 3.5–5.2)
Alkaline Phosphatase: 65 U/L (ref 39–117)
BUN: 13 mg/dL (ref 6–23)
CHLORIDE: 106 meq/L (ref 96–112)
CO2: 26 meq/L (ref 19–32)
CREATININE: 0.88 mg/dL (ref 0.40–1.20)
Calcium: 9 mg/dL (ref 8.4–10.5)
GFR: 73.07 mL/min (ref >60.00)
GLUCOSE: 98 mg/dL (ref 70–99)
Potassium: 4.7 mEq/L (ref 3.5–5.1)
Sodium: 139 mEq/L (ref 135–145)
Total Bilirubin: 0.4 mg/dL (ref 0.2–1.2)
Total Protein: 6.7 g/dL (ref 6.0–8.3)

## 2018-08-14 ENCOUNTER — Other Ambulatory Visit: Payer: Self-pay | Admitting: Internal Medicine

## 2018-09-12 ENCOUNTER — Other Ambulatory Visit: Payer: Self-pay | Admitting: Internal Medicine

## 2018-09-15 ENCOUNTER — Other Ambulatory Visit: Payer: Self-pay

## 2018-09-15 MED ORDER — CITALOPRAM HYDROBROMIDE 40 MG PO TABS: 40.0000 mg | ORAL_TABLET | Freq: Every day | ORAL | 2 refills | Status: DC

## 2018-10-18 ENCOUNTER — Other Ambulatory Visit: Payer: Self-pay | Admitting: Internal Medicine

## 2018-11-02 ENCOUNTER — Other Ambulatory Visit: Payer: Self-pay | Admitting: Internal Medicine

## 2018-11-02 NOTE — Telephone Encounter (Signed)
Last OV 08/02/2018   Last refilled ( xanax) 09/17/2018 disp 30 with no refills   Sent to [facility] for approval for controled.

## 2018-11-16 ENCOUNTER — Encounter: Payer: Self-pay | Admitting: Internal Medicine

## 2018-11-16 ENCOUNTER — Ambulatory Visit (INDEPENDENT_AMBULATORY_CARE_PROVIDER_SITE_OTHER): Payer: 59 | Admitting: Internal Medicine

## 2018-11-16 DIAGNOSIS — F439 Reaction to severe stress, unspecified: Secondary | ICD-10-CM | POA: Diagnosis not present

## 2018-11-16 DIAGNOSIS — Z9109 Other allergy status, other than to drugs and biological substances: Secondary | ICD-10-CM

## 2018-11-16 DIAGNOSIS — E7800 Pure hypercholesterolemia, unspecified: Secondary | ICD-10-CM | POA: Diagnosis not present

## 2018-11-16 MED ORDER — BUSPIRONE HCL 5 MG PO TABS: 5.0000 mg | ORAL_TABLET | Freq: Two times a day (BID) | ORAL | 1 refills | Status: DC | PRN

## 2018-11-16 MED ORDER — CITALOPRAM HYDROBROMIDE 40 MG PO TABS: 40.0000 mg | ORAL_TABLET | Freq: Every day | ORAL | 1 refills | Status: DC

## 2018-11-16 MED ORDER — ROSUVASTATIN CALCIUM 10 MG PO TABS: 10.0000 mg | ORAL_TABLET | Freq: Every day | ORAL | 1 refills | Status: DC

## 2018-11-16 MED ORDER — MUPIROCIN 2 % EX OINT: 1.0000 "application " | TOPICAL_OINTMENT | Freq: Two times a day (BID) | CUTANEOUS | 0 refills | Status: DC

## 2018-11-16 NOTE — Progress Notes (Signed)
[name], female   DOB: 21-Jul-1971, 48 y.o.   MRN: [id number]   Subjective:    [name], female    DOB: July 31, 1971, 48 y.o.   MRN: [id number]  HPI  Patient here for a scheduled follow up.  She reports increased stress and anxiety.  Increased stress with her mother's health issues and also trying to sell her house, etc.  Also increased stress with her son's and daughter's health issues.  Discussed with her today.  She is on citalopram.  Has required xanax nightly recently.  Discussed other treatment options.  Discussed buspar.  She is agreeable.  Discussed counseling.  She will notify me if agreeable.  Trying to stay active.  No chest pain.  Breathing stable.  Discussed diet and exercise.  No abdominal pain.  Bowels moving.     Past Medical History:  Diagnosis Date  . Allergy   . Blood in stool    H/O  . History of chicken pox   . Hx: UTI (urinary tract infection)   . Hyperlipidemia    Past Surgical History:  Procedure Laterality Date  . AUGMENTATION MAMMAPLASTY  2001  . CESAREAN SECTION  2007 and 2009  . tummy tuck  2001   Family History  Problem Relation Age of Onset  . Healthy Mother   . Hypertension Mother   . Cancer Father        non hogkins lymphoma  . Atrial fibrillation Father   . Heart disease Father   . Hypertension Father   . Arthritis Sister   . Asthma Sister   . Hypertension Sister   . Healthy Daughter   . Healthy Son   . Alzheimer's disease Maternal Grandmother   . Healthy Sister   . Arthritis Maternal Grandfather   . Colon cancer Paternal Uncle 60  . Breast cancer Neg Hx    Social History   Socioeconomic History  . Marital status: Married    Spouse name: Not on file  . Number of children: Not on file  . Years of education: Not on file  . Highest education level: Not on file  Occupational History  . Not on file  Social Needs  . Financial resource strain: Not on file  . Food insecurity:    Worry: Not on file   Inability: Not on file  . Transportation needs:    Medical: Not on file    Non-medical: Not on file  Tobacco Use  . Smoking status: Never Smoker  . Smokeless tobacco: Never Used  Substance and Sexual Activity  . Alcohol use: Yes    Alcohol/week: 0.0 standard drinks    Comment: occasional  . Drug use: No  . Sexual activity: Not on file  Lifestyle  . Physical activity:    Days per week: Not on file    Minutes per session: Not on file  . Stress: Not on file  Relationships  . Social connections:    Talks on phone: Not on file    Gets together: Not on file    Attends religious service: Not on file    Active member of club or organization: Not on file    Attends meetings of clubs or organizations: Not on file    Relationship status: Not on file  Other Topics Concern  . Not on file  Social History Narrative  . Not on file    Outpatient Encounter Medications as of 11/16/2018  Medication Sig  . ALPRAZolam Duanne Moron)  0.25 MG tablet TAKE 1 TABLET BY MOUTH ONCE DAILY AS NEEDED FOR ANXIETY  . cetirizine (ZYRTEC) 10 MG tablet Take 10 mg by mouth daily.  . citalopram (CELEXA) 40 MG tablet Take 1 tablet (40 mg total) by mouth daily.  . fluticasone (FLONASE) 50 MCG/ACT nasal spray PLACE 1 SPRAY INTO BOTH NOSTRILS TWO TIMES DAILY  . ibuprofen (ADVIL,MOTRIN) 200 MG tablet Take by mouth.  . nystatin cream (MYCOSTATIN) Apply 1 application topically 2 (two) times daily.  . pramoxine-hydrocortisone (ANALPRAM-HC) 1-1 % rectal cream Place 1 application rectally 2 (two) times daily.  . rosuvastatin (CRESTOR) 10 MG tablet Take 1 tablet (10 mg total) by mouth daily.  . [DISCONTINUED] citalopram (CELEXA) 40 MG tablet Take 1 tablet (40 mg total) by mouth daily.  . [DISCONTINUED] rosuvastatin (CRESTOR) 10 MG tablet TAKE 1 TABLET BY MOUTH DAILY.  . busPIRone (BUSPAR) 5 MG tablet Take 1 tablet (5 mg total) by mouth 2 (two) times daily as needed.  . mupirocin ointment (BACTROBAN) 2 % Place 1 application into  the nose 2 (two) times daily.  . [DISCONTINUED] citalopram (CELEXA) 40 MG tablet TAKE 1 TABLET BY MOUTH DAILY   No facility-administered encounter medications on file as of 11/16/2018.     Review of Systems  Constitutional: Negative for appetite change and unexpected weight change.  HENT: Negative for congestion and sinus pressure.   Respiratory: Negative for cough, chest tightness and shortness of breath.   Cardiovascular: Negative for chest pain, palpitations and leg swelling.  Gastrointestinal: Negative for abdominal pain, diarrhea, nausea and vomiting.  Genitourinary: Negative for difficulty urinating and dysuria.  Musculoskeletal: Negative for joint swelling and myalgias.  Skin: Negative for color change and rash.  Neurological: Negative for dizziness, light-headedness and headaches.  Psychiatric/Behavioral:       Increased stress and anxiety as outlined.         Objective:    Physical Exam Constitutional:      General: She is not in acute distress.    Appearance: Normal appearance.  HENT:     Nose: Nose normal. No congestion.     Mouth/Throat:     Pharynx: No oropharyngeal exudate or posterior oropharyngeal erythema.  Neck:     Musculoskeletal: Neck supple. No muscular tenderness.     Thyroid: No thyromegaly.  Cardiovascular:     Rate and Rhythm: Normal rate and regular rhythm.  Pulmonary:     Effort: No respiratory distress.     Breath sounds: Normal breath sounds. No wheezing.  Abdominal:     General: Bowel sounds are normal.     Palpations: Abdomen is soft.     Tenderness: There is no abdominal tenderness.  Musculoskeletal:        General: No swelling or tenderness.  Lymphadenopathy:     Cervical: No cervical adenopathy.  Skin:    Findings: No erythema or rash.  Neurological:     Mental Status: She is alert.  Psychiatric:        Mood and Affect: Mood normal.        Behavior: Behavior normal.     BP 134/72   Pulse 91   Temp 99.2 F (37.3 C) (Oral)    Resp 16   Wt 220 lb 3.2 oz (99.9 kg)   SpO2 97%   BMI 39.01 kg/m  Wt Readings from Last 3 Encounters:  11/16/18 220 lb 3.2 oz (99.9 kg)  08/02/18 217 lb 12.8 oz (98.8 kg)  04/18/18 218 lb 6.4 oz (99.1 kg)  Lab Results  Component Value Date   WBC 5.5 12/23/2017   HGB 13.5 12/23/2017   HCT 39.4 12/23/2017   PLT 306.0 12/23/2017   GLUCOSE 98 08/10/2018   CHOL 175 08/10/2018   TRIG 142.0 08/10/2018   HDL 47.80 08/10/2018   LDLCALC 99 08/10/2018   ALT 15 08/10/2018   AST 18 08/10/2018   NA 139 08/10/2018   K 4.7 08/10/2018   CL 106 08/10/2018   CREATININE 0.88 08/10/2018   BUN 13 08/10/2018   CO2 26 08/10/2018   TSH 1.81 12/23/2017    Mm 3d Screen Breast W/implant Bilateral  Result Date: 07/12/2018 CLINICAL DATA:  Screening. EXAM: DIGITAL SCREENING BILATERAL MAMMOGRAM WITH IMPLANTS, CAD AND TOMO The patient has implants. Standard and implant displaced views were performed. COMPARISON:  Previous exam(s). ACR Breast Density Category b: There are scattered areas of fibroglandular density. FINDINGS: There are no findings suspicious for malignancy. Images were processed with CAD. IMPRESSION: No mammographic evidence of malignancy. A result letter of this screening mammogram will be mailed directly to the patient. RECOMMENDATION: Screening mammogram in one year. (Code:SM-B-01Y) BI-RADS CATEGORY  1:  Negative. Electronically Signed   By: Abelardo Diesel M.D.   On: 07/12/2018 11:28       Assessment & Plan:   Problem List Items Addressed This Visit    Environmental allergies    Appears to be controlled.        Hypercholesterolemia    On crestor.  Low cholesterol diet and exercise.  Follow lipid panel and liver function tests.        Relevant Medications   rosuvastatin (CRESTOR) 10 MG tablet   Other Relevant Orders   CBC with Differential/Platelet   Hepatic function panel   Lipid panel   TSH   Basic metabolic panel   Stress    Increased stress as outlined.  Discussed  with her today.  Discussed counseling.  On citalopram.  Has xanax.  Add buspar as directed.  Follow.            Einar Pheasant, MD

## 2018-11-18 ENCOUNTER — Encounter: Payer: Self-pay | Admitting: Internal Medicine

## 2018-11-18 NOTE — Assessment & Plan Note (Signed)
Increased stress as outlined.  Discussed with her today.  Discussed counseling.  On citalopram.  Has xanax.  Add buspar as directed.  Follow.

## 2018-11-18 NOTE — Assessment & Plan Note (Signed)
Appears to be controlled.  

## 2018-11-18 NOTE — Assessment & Plan Note (Signed)
On crestor.  Low cholesterol diet and exercise.  Follow lipid panel and liver function tests.   

## 2018-12-04 ENCOUNTER — Other Ambulatory Visit: Payer: Self-pay

## 2018-12-04 ENCOUNTER — Encounter: Payer: Self-pay | Admitting: Internal Medicine

## 2018-12-04 ENCOUNTER — Ambulatory Visit (INDEPENDENT_AMBULATORY_CARE_PROVIDER_SITE_OTHER): Payer: 59 | Admitting: Internal Medicine

## 2018-12-04 DIAGNOSIS — J329 Chronic sinusitis, unspecified: Secondary | ICD-10-CM | POA: Diagnosis not present

## 2018-12-04 MED ORDER — AMOXICILLIN 875 MG PO TABS: 875.0000 mg | ORAL_TABLET | Freq: Two times a day (BID) | ORAL | 0 refills | Status: DC

## 2018-12-04 NOTE — Patient Instructions (Signed)
Continue flonase nasal spray and saline nasal spray as you are doing.    Can continue the "afrin" nasal spray - 2 sprays each nostril one time per day.  Do this in the evening.    Take the antibiotic twice a day  Take a probiotic daily while you are on the antibiotic and for two weeks after completing the antibiotics.

## 2018-12-04 NOTE — Progress Notes (Signed)
[name], female   DOB: 1971-07-27, 48 y.o.   MRN: [id number]   Subjective:    [name], female    DOB: 21-Dec-1970, 48 y.o.   MRN: [id number]  HPI  Patient here as a work in with concerns regarding a possible sinus infection.  States husband was sick first.  A couple of days later, her symptoms started.  Present for the past week.  Increased chest congestion and cough.  End of last week, developed more runny nose, watery eyes and nasal congestion and sinus pressure.  Still with some cough.  Tmax 100.8.  No vomiting.  No sob.  No acid reflux.  No abdominal pain.  No diarrhea.  Used afrin.  Taking allergy pills and tylenol.  No recent travel or contacts with high risk pts.     Past Medical History:  Diagnosis Date  . Allergy   . Blood in stool    H/O  . History of chicken pox   . Hx: UTI (urinary tract infection)   . Hyperlipidemia    Past Surgical History:  Procedure Laterality Date  . AUGMENTATION MAMMAPLASTY  2001  . CESAREAN SECTION  2007 and 2009  . tummy tuck  2001   Family History  Problem Relation Age of Onset  . Healthy Mother   . Hypertension Mother   . Cancer Father        non hogkins lymphoma  . Atrial fibrillation Father   . Heart disease Father   . Hypertension Father   . Arthritis Sister   . Asthma Sister   . Hypertension Sister   . Healthy Daughter   . Healthy Son   . Alzheimer's disease Maternal Grandmother   . Healthy Sister   . Arthritis Maternal Grandfather   . Colon cancer Paternal Uncle 46  . Breast cancer Neg Hx    Social History   Socioeconomic History  . Marital status: Married    Spouse name: Not on file  . Number of children: Not on file  . Years of education: Not on file  . Highest education level: Not on file  Occupational History  . Not on file  Social Needs  . Financial resource strain: Not on file  . Food insecurity:    Worry: Not on file    Inability: Not on file  . Transportation needs:   Medical: Not on file    Non-medical: Not on file  Tobacco Use  . Smoking status: Never Smoker  . Smokeless tobacco: Never Used  Substance and Sexual Activity  . Alcohol use: Yes    Alcohol/week: 0.0 standard drinks    Comment: occasional  . Drug use: No  . Sexual activity: Not on file  Lifestyle  . Physical activity:    Days per week: Not on file    Minutes per session: Not on file  . Stress: Not on file  Relationships  . Social connections:    Talks on phone: Not on file    Gets together: Not on file    Attends religious service: Not on file    Active member of club or organization: Not on file    Attends meetings of clubs or organizations: Not on file    Relationship status: Not on file  Other Topics Concern  . Not on file  Social History Narrative  . Not on file    Outpatient Encounter Medications as of 12/04/2018  Medication Sig  . busPIRone (BUSPAR) 5 MG  tablet Take 1 tablet (5 mg total) by mouth 2 (two) times daily as needed.  . cetirizine (ZYRTEC) 10 MG tablet Take 10 mg by mouth daily.  . citalopram (CELEXA) 40 MG tablet Take 1 tablet (40 mg total) by mouth daily.  . fluticasone (FLONASE) 50 MCG/ACT nasal spray PLACE 1 SPRAY INTO BOTH NOSTRILS TWO TIMES DAILY  . ibuprofen (ADVIL,MOTRIN) 200 MG tablet Take by mouth.  . mupirocin ointment (BACTROBAN) 2 % Place 1 application into the nose 2 (two) times daily.  Marland Kitchen nystatin cream (MYCOSTATIN) Apply 1 application topically 2 (two) times daily.  . pramoxine-hydrocortisone (ANALPRAM-HC) 1-1 % rectal cream Place 1 application rectally 2 (two) times daily.  . rosuvastatin (CRESTOR) 10 MG tablet Take 1 tablet (10 mg total) by mouth daily.  . [DISCONTINUED] ALPRAZolam (XANAX) 0.25 MG tablet TAKE 1 TABLET BY MOUTH ONCE DAILY AS NEEDED FOR ANXIETY  . amoxicillin (AMOXIL) 875 MG tablet Take 1 tablet (875 mg total) by mouth 2 (two) times daily.   No facility-administered encounter medications on file as of 12/04/2018.     Review  of Systems  Constitutional: Positive for fever. Negative for activity change.  HENT: Positive for congestion, postnasal drip and sinus pressure.   Respiratory: Positive for cough. Negative for chest tightness and shortness of breath.   Cardiovascular: Negative for chest pain and leg swelling.  Gastrointestinal: Negative for abdominal pain, diarrhea, nausea and vomiting.  Genitourinary: Negative for difficulty urinating and dysuria.  Musculoskeletal: Negative for joint swelling and myalgias.  Skin: Negative for color change and rash.  Neurological: Negative for dizziness, light-headedness and headaches.  Psychiatric/Behavioral: Negative for agitation and dysphoric mood.       Objective:    Physical Exam Constitutional:      General: She is not in acute distress.    Appearance: Normal appearance.  HENT:     Head:     Comments: Minimal sinus pressure to palpation.      Nose: Congestion present.     Mouth/Throat:     Pharynx: No oropharyngeal exudate or posterior oropharyngeal erythema.  Neck:     Musculoskeletal: Neck supple. No muscular tenderness.     Thyroid: No thyromegaly.  Cardiovascular:     Rate and Rhythm: Normal rate and regular rhythm.  Pulmonary:     Effort: No respiratory distress.     Breath sounds: Normal breath sounds. No wheezing.  Abdominal:     General: Bowel sounds are normal.     Palpations: Abdomen is soft.     Tenderness: There is no abdominal tenderness.  Musculoskeletal:        General: No swelling or tenderness.  Lymphadenopathy:     Cervical: No cervical adenopathy.  Skin:    Findings: No erythema or rash.  Neurological:     Mental Status: She is alert.  Psychiatric:        Mood and Affect: Mood normal.        Behavior: Behavior normal.     BP 130/78   Pulse 78   Temp 99.1 F (37.3 C) (Oral)   Resp 16   Wt 216 lb 12.8 oz (98.3 kg)   SpO2 99%   BMI 38.40 kg/m  Wt Readings from Last 3 Encounters:  12/04/18 216 lb 12.8 oz (98.3 kg)   11/16/18 220 lb 3.2 oz (99.9 kg)  08/02/18 217 lb 12.8 oz (98.8 kg)     Lab Results  Component Value Date   WBC 5.5 12/23/2017   HGB 13.5 12/23/2017  HCT 39.4 12/23/2017   PLT 306.0 12/23/2017   GLUCOSE 98 08/10/2018   CHOL 175 08/10/2018   TRIG 142.0 08/10/2018   HDL 47.80 08/10/2018   LDLCALC 99 08/10/2018   ALT 15 08/10/2018   AST 18 08/10/2018   NA 139 08/10/2018   K 4.7 08/10/2018   CL 106 08/10/2018   CREATININE 0.88 08/10/2018   BUN 13 08/10/2018   CO2 26 08/10/2018   TSH 1.81 12/23/2017    Mm 3d Screen Breast W/implant Bilateral  Result Date: 07/12/2018 CLINICAL DATA:  Screening. EXAM: DIGITAL SCREENING BILATERAL MAMMOGRAM WITH IMPLANTS, CAD AND TOMO The patient has implants. Standard and implant displaced views were performed. COMPARISON:  Previous exam(s). ACR Breast Density Category b: There are scattered areas of fibroglandular density. FINDINGS: There are no findings suspicious for malignancy. Images were processed with CAD. IMPRESSION: No mammographic evidence of malignancy. A result letter of this screening mammogram will be mailed directly to the patient. RECOMMENDATION: Screening mammogram in one year. (Code:SM-B-01Y) BI-RADS CATEGORY  1:  Negative. Electronically Signed   By: Abelardo Diesel M.D.   On: 07/12/2018 11:28       Assessment & Plan:   Problem List Items Addressed This Visit    Sinusitis    Symptoms appear to be c/w sinusitis/URI.  Treat with amoxicillin as directed.  Can continue afrin for 3 days.  flonase as directed.  Follow.  Call if symptoms progress or any change or worsening problems.        Relevant Medications   amoxicillin (AMOXIL) 875 MG tablet       Einar Pheasant, MD

## 2018-12-07 ENCOUNTER — Other Ambulatory Visit: Payer: Self-pay | Admitting: Internal Medicine

## 2018-12-07 ENCOUNTER — Telehealth: Payer: Self-pay | Admitting: Internal Medicine

## 2018-12-07 NOTE — Telephone Encounter (Signed)
She was just seen 12/04/18.  I am not sure that the abx have had time to work.  Please make sure she is using the other medication - saline nasal rinses, nasacort or flonase nasal spray, mucinex.  Let me know if any questions or problems and she can keep [place] updated.

## 2018-12-07 NOTE — Telephone Encounter (Signed)
Copied from [place] [phone]. Topic: Quick Communication - See Telephone Encounter >> Dec 07, 2018  9:00 AM Margot Ables wrote: CRM for notification. See Telephone encounter for: 12/07/18.  Pt called stating she is still having a lot of head and sinus pressure. She said that she doesn't feel much better and doesn't think the amoxicillin is working for her. Pt requesting change on ABX.  [place], [place] [phone] (Phone) [phone] (Fax)

## 2018-12-07 NOTE — Telephone Encounter (Signed)
Spoke with patient. She is not having any new sx. She was just seen. Says she is still having sinus pressure. She does not feel as if the abx are working. She is also using flonase and antihistamine q day. That is part of her daily medication. She is requesting a new abx.

## 2018-12-07 NOTE — Telephone Encounter (Signed)
Patient is going to wait and make sure abx have time to work. She is using saline rinses, flonase and is going to get coricidin hbp to use. She cannot take mucinex. Advised to let me know if she needs anything and to call with update

## 2018-12-08 NOTE — Telephone Encounter (Signed)
Last OV 12/04/2018  Last refilled 11/02/2018 disp 30 with no refills   Next appt 01/05/2019  Sent to PCP for approval

## 2018-12-09 ENCOUNTER — Encounter: Payer: Self-pay | Admitting: Internal Medicine

## 2018-12-09 DIAGNOSIS — J329 Chronic sinusitis, unspecified: Secondary | ICD-10-CM | POA: Insufficient documentation

## 2018-12-09 NOTE — Assessment & Plan Note (Signed)
Symptoms appear to be c/w sinusitis/URI.  Treat with amoxicillin as directed.  Can continue afrin for 3 days.  flonase as directed.  Follow.  Call if symptoms progress or any change or worsening problems.

## 2019-01-02 ENCOUNTER — Telehealth: Payer: Self-pay

## 2019-01-02 NOTE — Telephone Encounter (Signed)
Copied from [place] [id number]. Topic: General - Call Back - No Documentation >> Jan 02, 2019 10:56 AM Sheran Luz wrote: Reason for CRM: Patient returning call to [place].

## 2019-01-03 ENCOUNTER — Other Ambulatory Visit: Payer: 59

## 2019-01-04 NOTE — Telephone Encounter (Signed)
Spoke to pt

## 2019-01-05 ENCOUNTER — Ambulatory Visit: Payer: 59 | Admitting: Internal Medicine

## 2019-01-16 MED FILL — FLUTICASONE PROP 50 MCG SPR: 50 | 30 days supply | Qty: 16 | Fill #0

## 2019-02-07 ENCOUNTER — Other Ambulatory Visit: Payer: Self-pay | Admitting: Internal Medicine

## 2019-02-07 NOTE — Telephone Encounter (Signed)
Copied from [place] [phone]. Topic: Appointment Scheduling - Scheduling Inquiry for Clinic >> Feb 07, 2019  9:11 AM Sheran Luz wrote: Reason for CRM: Patient called to reschedule lab and OV appointments. Attempted to contact office, no answer.  lmtcb to call back to cancel and reschedule her OV and lab appt.  Nina,cma

## 2019-02-07 NOTE — Telephone Encounter (Signed)
rx ok'd and sent in for xanax #30 with no refills.  Can schedule her for a virtual visit.

## 2019-02-07 NOTE — Telephone Encounter (Signed)
Refilled: 12/08/2018 Last OV: 12/04/2018 Next OV: 03/19/2019

## 2019-02-09 NOTE — Telephone Encounter (Signed)
Pt called back and said that she has a physical appt scheduled for 03/19/19 and will have enough xanax to last her until then since she doesn't take them everyday.  Pt said that she is doing well.

## 2019-02-09 NOTE — Telephone Encounter (Signed)
Called pt to schedule a virtual visit.  No answer.  LMTCB.

## 2019-03-16 ENCOUNTER — Other Ambulatory Visit: Payer: 59

## 2019-03-19 ENCOUNTER — Ambulatory Visit (INDEPENDENT_AMBULATORY_CARE_PROVIDER_SITE_OTHER): Payer: 59 | Admitting: Internal Medicine

## 2019-03-19 ENCOUNTER — Encounter: Payer: 59 | Admitting: Internal Medicine

## 2019-03-19 ENCOUNTER — Other Ambulatory Visit: Payer: 59

## 2019-03-19 ENCOUNTER — Other Ambulatory Visit: Payer: Self-pay

## 2019-03-19 DIAGNOSIS — N898 Other specified noninflammatory disorders of vagina: Secondary | ICD-10-CM | POA: Diagnosis not present

## 2019-03-19 DIAGNOSIS — Z9109 Other allergy status, other than to drugs and biological substances: Secondary | ICD-10-CM

## 2019-03-19 DIAGNOSIS — M25562 Pain in left knee: Secondary | ICD-10-CM | POA: Diagnosis not present

## 2019-03-19 DIAGNOSIS — F439 Reaction to severe stress, unspecified: Secondary | ICD-10-CM

## 2019-03-19 DIAGNOSIS — E7800 Pure hypercholesterolemia, unspecified: Secondary | ICD-10-CM | POA: Diagnosis not present

## 2019-03-19 DIAGNOSIS — M25561 Pain in right knee: Secondary | ICD-10-CM | POA: Diagnosis not present

## 2019-03-19 MED ORDER — FLUCONAZOLE 150 MG PO TABS: ORAL_TABLET | ORAL | 0 refills | Status: DC

## 2019-03-19 MED ORDER — CITALOPRAM HYDROBROMIDE 20 MG PO TABS: ORAL_TABLET | ORAL | 3 refills | Status: DC

## 2019-03-19 MED ORDER — FLUTICASONE PROPIONATE 50 MCG/ACT NA SUSP: NASAL | 2 refills | Status: DC

## 2019-03-19 NOTE — Progress Notes (Signed)
[name], female   DOB: 1971/05/20, 48 y.o.   MRN: [id number] ...Marland KitchenMarland KitchenMarland Kitchen   Virtual Visit via telephone Note  This visit type was conducted due to national recommendations for restrictions regarding the COVID-19 pandemic (e.g. social distancing).  This format is felt to be most appropriate for this patient at this time.  All issues noted in this document were discussed and addressed.  No physical exam was performed (except for noted visual exam findings with Video Visits).   I connected with Tamara Shah by telephone and verified that I am speaking with the correct person using two identifiers. Location patient: home Location provider: work Persons participating in the virtual visit: patient, provider  I discussed the limitations, risks, security and privacy concerns of performing an evaluation and management service by telephone and the availability of in person appointments. The patient expressed understanding and agreed to proceed.   Reason for visit: scheduled follow up.   HPI: Increased stress.  She feels she is handling things relatively well.  Discussed with her today.  Discussed possibility of tapering citalopram.  Rarely uses xanax.  Trying to stay in due to covid restrictions.  Niece - covid positive.  Found out last week.  Has not had close contact.  No fever.  No chest congestion, chest pain, chest tightness or sob.  No acid reflux.  No abdominal pain.  Bowels moving.  Does report some vaginal itching - extends from the vaginal opening to her rectum.  States feels like yeast infection.  Taking crestor and tolerating.  She also reports pain in her knees.  Feel stiff.  Pain - bending knees.  Walking down stairs - worse.  Some swelling.  No increased erythema.  No increased warmth.    ROS: See pertinent positives and negatives per HPI.  Past Medical History:  Diagnosis Date  . Allergy   . Blood in stool    H/O  . History of chicken pox   . Hx: UTI (urinary tract  infection)   . Hyperlipidemia     Past Surgical History:  Procedure Laterality Date  . AUGMENTATION MAMMAPLASTY  2001  . CESAREAN SECTION  2007 and 2009  . tummy tuck  2001    Family History  Problem Relation Age of Onset  . Healthy Mother   . Hypertension Mother   . Cancer Father        non hogkins lymphoma  . Atrial fibrillation Father   . Heart disease Father   . Hypertension Father   . Arthritis Sister   . Asthma Sister   . Hypertension Sister   . Healthy Daughter   . Healthy Son   . Alzheimer's disease Maternal Grandmother   . Healthy Sister   . Arthritis Maternal Grandfather   . Colon cancer Paternal Uncle 55  . Breast cancer Neg Hx     SOCIAL HX: reviewed.    Current Outpatient Medications:  .  ALPRAZolam (XANAX) 0.25 MG tablet, TAKE 1 TABLET BY MOUTH ONCE DAILY AS NEEDED FOR ANXIETY, Disp: 30 tablet, Rfl: 0 .  busPIRone (BUSPAR) 5 MG tablet, Take 1 tablet (5 mg total) by mouth 2 (two) times daily as needed., Disp: 60 tablet, Rfl: 1 .  cetirizine (ZYRTEC) 10 MG tablet, Take 10 mg by mouth daily., Disp: , Rfl:  .  fluticasone (FLONASE) 50 MCG/ACT nasal spray, 2 sprays each nostril one time per day, Disp: 16 g, Rfl: 2 .  ibuprofen (ADVIL,MOTRIN) 200 MG tablet, Take by mouth., Disp: ,  Rfl:  .  rosuvastatin (CRESTOR) 10 MG tablet, Take 1 tablet (10 mg total) by mouth daily., Disp: 90 tablet, Rfl: 1 .  citalopram (CELEXA) 20 MG tablet, Take 1-2 tablets q day, Disp: 60 tablet, Rfl: 3 .  fluconazole (DIFLUCAN) 150 MG tablet, Take one tablet x 1 and then may repeat x 1 in 3 days., Disp: 2 tablet, Rfl: 0  EXAM:  GENERAL: alert.  Answering questions appropriately.  Sounds to be in no acute distress.    PSYCH/NEURO: pleasant and cooperative, no obvious depression or anxiety, speech and thought processing grossly intact  ASSESSMENT AND PLAN:  Discussed the following assessment and plan:  Environmental allergies Symptoms controlled.    Hypercholesterolemia On  crestor.  Low cholesterol diet and exercise.  Follow lipid panel and liver function tests.    Stress Increased stress as outlined.  Doing better.  Rarely takes xanax. Discussed tapering citalopram.    Knee pain Bilateral knee pain and some swelling as outlined.  Persistent pain.  Discussed further w/up.  She agrees.  Tylenol as directed.  Check xray.  Follow.    Vaginal irritation Persistent vaginal irritation.  Diflucan as directed.  Follow.     I discussed the assessment and treatment plan with the patient. The patient was provided an opportunity to ask questions and all were answered. The patient agreed with the plan and demonstrated an understanding of the instructions.   The patient was advised to call back or seek an in-person evaluation if the symptoms worsen or if the condition fails to improve as anticipated.  I provided 30 minutes of non-face-to-face time during this encounter.   Einar Pheasant, MD

## 2019-03-23 ENCOUNTER — Encounter: Payer: Self-pay | Admitting: Internal Medicine

## 2019-03-23 DIAGNOSIS — N898 Other specified noninflammatory disorders of vagina: Secondary | ICD-10-CM | POA: Insufficient documentation

## 2019-03-23 DIAGNOSIS — M25569 Pain in unspecified knee: Secondary | ICD-10-CM | POA: Insufficient documentation

## 2019-03-23 NOTE — Assessment & Plan Note (Signed)
Bilateral knee pain and some swelling as outlined.  Persistent pain.  Discussed further w/up.  She agrees.  Tylenol as directed.  Check xray.  Follow.

## 2019-03-23 NOTE — Assessment & Plan Note (Signed)
Increased stress as outlined.  Doing better.  Rarely takes xanax. Discussed tapering citalopram.

## 2019-03-23 NOTE — Assessment & Plan Note (Signed)
Symptoms controlled

## 2019-03-23 NOTE — Assessment & Plan Note (Signed)
Persistent vaginal irritation.  Diflucan as directed.  Follow.

## 2019-03-23 NOTE — Assessment & Plan Note (Signed)
On crestor.  Low cholesterol diet and exercise.  Follow lipid panel and liver function tests.   

## 2019-03-30 ENCOUNTER — Other Ambulatory Visit: Payer: Self-pay | Admitting: Internal Medicine

## 2019-03-30 DIAGNOSIS — M25561 Pain in right knee: Secondary | ICD-10-CM

## 2019-03-30 DIAGNOSIS — M25562 Pain in left knee: Secondary | ICD-10-CM

## 2019-03-30 NOTE — Progress Notes (Signed)
Order placed for bilateral knee xray

## 2019-04-02 ENCOUNTER — Encounter: Payer: Self-pay | Admitting: Internal Medicine

## 2019-04-02 DIAGNOSIS — M25562 Pain in left knee: Secondary | ICD-10-CM

## 2019-04-02 DIAGNOSIS — M25561 Pain in right knee: Secondary | ICD-10-CM

## 2019-04-03 ENCOUNTER — Other Ambulatory Visit: Payer: Self-pay

## 2019-04-03 ENCOUNTER — Ambulatory Visit (INDEPENDENT_AMBULATORY_CARE_PROVIDER_SITE_OTHER): Payer: 59

## 2019-04-03 ENCOUNTER — Other Ambulatory Visit (INDEPENDENT_AMBULATORY_CARE_PROVIDER_SITE_OTHER): Payer: 59

## 2019-04-03 DIAGNOSIS — M25561 Pain in right knee: Secondary | ICD-10-CM | POA: Diagnosis not present

## 2019-04-03 DIAGNOSIS — E7800 Pure hypercholesterolemia, unspecified: Secondary | ICD-10-CM

## 2019-04-03 DIAGNOSIS — M25562 Pain in left knee: Secondary | ICD-10-CM

## 2019-04-03 DIAGNOSIS — M1712 Unilateral primary osteoarthritis, left knee: Secondary | ICD-10-CM | POA: Diagnosis not present

## 2019-04-03 DIAGNOSIS — M1711 Unilateral primary osteoarthritis, right knee: Secondary | ICD-10-CM | POA: Diagnosis not present

## 2019-04-03 LAB — CBC WITH DIFFERENTIAL/PLATELET
Basophils Absolute: 0 10*3/uL (ref 0.0–0.1)
Basophils Relative: 0.3 % (ref 0.0–3.0)
Eosinophils Absolute: 0.1 10*3/uL (ref 0.0–0.7)
Eosinophils Relative: 1.4 % (ref 0.0–5.0)
HCT: 36.2 % (ref 36.0–46.0)
Hemoglobin: 12 g/dL (ref 12.0–15.0)
Lymphocytes Relative: 22.4 % (ref 12.0–46.0)
Lymphs Abs: 1.5 10*3/uL (ref 0.7–4.0)
MCHC: 33.1 g/dL (ref 30.0–36.0)
MCV: 85.1 fl (ref 78.0–100.0)
Monocytes Absolute: 0.5 10*3/uL (ref 0.1–1.0)
Monocytes Relative: 7.7 % (ref 3.0–12.0)
Neutro Abs: 4.5 10*3/uL (ref 1.4–7.7)
Neutrophils Relative %: 68.2 % (ref 43.0–77.0)
Platelets: 427 10*3/uL — ABNORMAL HIGH (ref 150.0–400.0)
RBC: 4.26 Mil/uL (ref 3.87–5.11)
RDW: 12.9 % (ref 11.5–15.5)
WBC: 6.6 10*3/uL (ref 4.0–10.5)

## 2019-04-03 LAB — SEDIMENTATION RATE: Sed Rate: 39 mm/hr — ABNORMAL HIGH (ref 0–20)

## 2019-04-03 LAB — LIPID PANEL
Cholesterol: 146 mg/dL (ref 0–200)
HDL: 35.8 mg/dL — ABNORMAL LOW (ref >39.00)
NonHDL: 109.83
Total CHOL/HDL Ratio: 4
Triglycerides: 204 mg/dL — ABNORMAL HIGH (ref 0.0–149.0)
VLDL: 40.8 mg/dL — ABNORMAL HIGH (ref 0.0–40.0)

## 2019-04-03 LAB — HEPATIC FUNCTION PANEL
ALT: 15 U/L (ref 0–35)
AST: 17 U/L (ref 0–37)
Albumin: 3.9 g/dL (ref 3.5–5.2)
Alkaline Phosphatase: 78 U/L (ref 39–117)
Bilirubin, Direct: 0.1 mg/dL (ref 0.0–0.3)
Total Bilirubin: 0.4 mg/dL (ref 0.2–1.2)
Total Protein: 6.3 g/dL (ref 6.0–8.3)

## 2019-04-03 LAB — BASIC METABOLIC PANEL
BUN: 13 mg/dL (ref 6–23)
CO2: 27 mEq/L (ref 19–32)
Calcium: 8.7 mg/dL (ref 8.4–10.5)
Chloride: 105 mEq/L (ref 96–112)
Creatinine, Ser: 0.66 mg/dL (ref 0.40–1.20)
GFR: 95.55 mL/min (ref >60.00)
Glucose, Bld: 88 mg/dL (ref 70–99)
Potassium: 4.3 mEq/L (ref 3.5–5.1)
Sodium: 138 mEq/L (ref 135–145)

## 2019-04-03 LAB — TSH: TSH: 1.5 u[IU]/mL (ref 0.35–4.50)

## 2019-04-03 LAB — LDL CHOLESTEROL, DIRECT: Direct LDL: 87 mg/dL

## 2019-04-03 NOTE — Telephone Encounter (Signed)
Left message for patient. Note placed on lab schedule

## 2019-04-03 NOTE — Telephone Encounter (Signed)
Received this my chart message.  Labs were already in the system.  I did add a couple of things given her pain in knees.  Need to notify lab that she needs all of the labs drawn, including the labs from 10/2018 and the ones I added today.  Xray orders are in.  It appears she has an appt today - not sure if she is aware.  Please contact pt and make sure aware of appt.  Also, if increased pain, can refer to ortho. (also, let her know that there is ortho walk in available).

## 2019-04-04 ENCOUNTER — Encounter: Payer: Self-pay | Admitting: *Deleted

## 2019-04-04 ENCOUNTER — Other Ambulatory Visit: Payer: Self-pay | Admitting: Internal Medicine

## 2019-04-04 DIAGNOSIS — D75839 Thrombocytosis, unspecified: Secondary | ICD-10-CM

## 2019-04-04 DIAGNOSIS — D473 Essential (hemorrhagic) thrombocythemia: Secondary | ICD-10-CM

## 2019-04-04 LAB — RHEUMATOID FACTOR: Rheumatoid fact SerPl-aCnc: <14 IU/mL (ref <14)

## 2019-04-04 NOTE — Progress Notes (Signed)
Order placed for f/u labs.  

## 2019-04-05 NOTE — Telephone Encounter (Signed)
Please notify pt of lab and xray results.  I have placed the order for ortho referral.

## 2019-04-05 NOTE — Telephone Encounter (Signed)
Left message to give pt results.

## 2019-04-05 NOTE — Telephone Encounter (Signed)
Lab and x-ray results given to pt

## 2019-04-06 ENCOUNTER — Encounter: Payer: Self-pay | Admitting: Internal Medicine

## 2019-04-09 MED ORDER — MELOXICAM 7.5 MG PO TABS: 7.5000 mg | ORAL_TABLET | Freq: Every day | ORAL | 0 refills | Status: DC

## 2019-04-09 NOTE — Telephone Encounter (Signed)
rx sent in for meloxicam.

## 2019-04-23 ENCOUNTER — Encounter: Payer: Self-pay | Admitting: Internal Medicine

## 2019-04-23 MED ORDER — CITALOPRAM HYDROBROMIDE 20 MG PO TABS: ORAL_TABLET | ORAL | 2 refills | Status: DC

## 2019-04-23 NOTE — Telephone Encounter (Signed)
Rx sent in for citalopram #30 with 2 refills.

## 2019-04-27 DIAGNOSIS — M79644 Pain in right finger(s): Secondary | ICD-10-CM | POA: Diagnosis not present

## 2019-04-27 DIAGNOSIS — G8929 Other chronic pain: Secondary | ICD-10-CM | POA: Diagnosis not present

## 2019-04-27 DIAGNOSIS — M79645 Pain in left finger(s): Secondary | ICD-10-CM | POA: Diagnosis not present

## 2019-04-27 DIAGNOSIS — M1811 Unilateral primary osteoarthritis of first carpometacarpal joint, right hand: Secondary | ICD-10-CM | POA: Diagnosis not present

## 2019-04-27 DIAGNOSIS — M25562 Pain in left knee: Secondary | ICD-10-CM | POA: Diagnosis not present

## 2019-04-27 DIAGNOSIS — M25561 Pain in right knee: Secondary | ICD-10-CM | POA: Diagnosis not present

## 2019-04-27 DIAGNOSIS — M170 Bilateral primary osteoarthritis of knee: Secondary | ICD-10-CM | POA: Diagnosis not present

## 2019-05-01 ENCOUNTER — Other Ambulatory Visit (INDEPENDENT_AMBULATORY_CARE_PROVIDER_SITE_OTHER): Payer: 59

## 2019-05-01 ENCOUNTER — Other Ambulatory Visit: Payer: Self-pay

## 2019-05-01 DIAGNOSIS — D473 Essential (hemorrhagic) thrombocythemia: Secondary | ICD-10-CM

## 2019-05-01 DIAGNOSIS — D75839 Thrombocytosis, unspecified: Secondary | ICD-10-CM

## 2019-05-01 LAB — CBC WITH DIFFERENTIAL/PLATELET
Basophils Absolute: 0 10*3/uL (ref 0.0–0.1)
Basophils Relative: 0.2 % (ref 0.0–3.0)
Eosinophils Absolute: 0.1 10*3/uL (ref 0.0–0.7)
Eosinophils Relative: 1.1 % (ref 0.0–5.0)
HCT: 34.6 % — ABNORMAL LOW (ref 36.0–46.0)
Hemoglobin: 11.2 g/dL — ABNORMAL LOW (ref 12.0–15.0)
Lymphocytes Relative: 19.2 % (ref 12.0–46.0)
Lymphs Abs: 1.8 10*3/uL (ref 0.7–4.0)
MCHC: 32.3 g/dL (ref 30.0–36.0)
MCV: 82.5 fl (ref 78.0–100.0)
Monocytes Absolute: 1 10*3/uL (ref 0.1–1.0)
Monocytes Relative: 10.1 % (ref 3.0–12.0)
Neutro Abs: 6.6 10*3/uL (ref 1.4–7.7)
Neutrophils Relative %: 69.4 % (ref 43.0–77.0)
Platelets: 540 10*3/uL — ABNORMAL HIGH (ref 150.0–400.0)
RBC: 4.19 Mil/uL (ref 3.87–5.11)
RDW: 13.7 % (ref 11.5–15.5)
WBC: 9.5 10*3/uL (ref 4.0–10.5)

## 2019-05-02 ENCOUNTER — Other Ambulatory Visit: Payer: Self-pay | Admitting: Internal Medicine

## 2019-05-02 DIAGNOSIS — D649 Anemia, unspecified: Secondary | ICD-10-CM

## 2019-05-02 NOTE — Progress Notes (Signed)
Add-on labs

## 2019-05-03 ENCOUNTER — Other Ambulatory Visit: Payer: Self-pay

## 2019-05-07 ENCOUNTER — Other Ambulatory Visit (HOSPITAL_COMMUNITY)
Admission: RE | Admit: 2019-05-07 | Discharge: 2019-05-07 | Disposition: A | Discharge status: Home / Self Care | Payer: 59 | Source: Ambulatory Visit | Attending: Internal Medicine | Admitting: Internal Medicine

## 2019-05-07 ENCOUNTER — Ambulatory Visit (INDEPENDENT_AMBULATORY_CARE_PROVIDER_SITE_OTHER): Payer: 59 | Admitting: Internal Medicine

## 2019-05-07 ENCOUNTER — Other Ambulatory Visit: Payer: Self-pay

## 2019-05-07 VITALS — BP 130/72 | HR 77 | Temp 96.9°F | Resp 16 | Ht 63.0 in | Wt 215.0 lb

## 2019-05-07 DIAGNOSIS — D75839 Thrombocytosis, unspecified: Secondary | ICD-10-CM

## 2019-05-07 DIAGNOSIS — Z0000 Encounter for general adult medical examination without abnormal findings: Secondary | ICD-10-CM | POA: Diagnosis not present

## 2019-05-07 DIAGNOSIS — D649 Anemia, unspecified: Secondary | ICD-10-CM | POA: Diagnosis not present

## 2019-05-07 DIAGNOSIS — Z1231 Encounter for screening mammogram for malignant neoplasm of breast: Secondary | ICD-10-CM

## 2019-05-07 DIAGNOSIS — M25562 Pain in left knee: Secondary | ICD-10-CM

## 2019-05-07 DIAGNOSIS — F439 Reaction to severe stress, unspecified: Secondary | ICD-10-CM

## 2019-05-07 DIAGNOSIS — M25561 Pain in right knee: Secondary | ICD-10-CM | POA: Diagnosis not present

## 2019-05-07 DIAGNOSIS — E7800 Pure hypercholesterolemia, unspecified: Secondary | ICD-10-CM

## 2019-05-07 DIAGNOSIS — Z124 Encounter for screening for malignant neoplasm of cervix: Secondary | ICD-10-CM | POA: Diagnosis not present

## 2019-05-07 DIAGNOSIS — Z6838 Body mass index (BMI) 38.0-38.9, adult: Secondary | ICD-10-CM | POA: Diagnosis not present

## 2019-05-07 DIAGNOSIS — Z9109 Other allergy status, other than to drugs and biological substances: Secondary | ICD-10-CM | POA: Diagnosis not present

## 2019-05-07 DIAGNOSIS — D473 Essential (hemorrhagic) thrombocythemia: Secondary | ICD-10-CM | POA: Diagnosis not present

## 2019-05-07 LAB — SEDIMENTATION RATE: Sed Rate: 68 mm/hr — ABNORMAL HIGH (ref 0–20)

## 2019-05-07 LAB — IBC + FERRITIN
Ferritin: 36.5 ng/mL (ref 10.0–291.0)
Iron: 15 ug/dL — ABNORMAL LOW (ref 42–145)
Saturation Ratios: 4.4 % — ABNORMAL LOW (ref 20.0–50.0)
Transferrin: 245 mg/dL (ref 212.0–360.0)

## 2019-05-07 LAB — VITAMIN B12: Vitamin B-12: 199 pg/mL — ABNORMAL LOW (ref 211–911)

## 2019-05-07 NOTE — Assessment & Plan Note (Signed)
Physical performed today 05/07/19.  PAP 05/07/19.  Mammogram 07/12/18- [facility] I.

## 2019-05-07 NOTE — Progress Notes (Signed)
[name], female   DOB: [birth year], 48 y.o.   MRN: [id number]   Subjective:    [name], female    DOB: December 05, 1970, 48 y.o.   MRN: [id number]  HPI  Patient here for her physical exam.  She reports she is doing relatively well.  Increased joint pain - knee pain.  Has seen ortho.  S/p aspiration/injection.  Taking ibuprofen.  Still with increased pain.  Limiting her activity.  Increased problems.  Stiff.  No chest pain.  No sob.  No acid reflux.  No abdominal pain.  Bowels moving.  Taking citalopram.  Feels doing well on this medication.  Taking crestor.  No vaginal itching or discharge.     Past Medical History:  Diagnosis Date  . Allergy   . Blood in stool    H/O  . History of chicken pox   . Hx: UTI (urinary tract infection)   . Hyperlipidemia    Past Surgical History:  Procedure Laterality Date  . AUGMENTATION MAMMAPLASTY  2001  . CESAREAN SECTION  2007 and 2009  . tummy tuck  2001   Family History  Problem Relation Age of Onset  . Healthy Mother   . Hypertension Mother   . Cancer Father        non hogkins lymphoma  . Atrial fibrillation Father   . Heart disease Father   . Hypertension Father   . Arthritis Sister   . Asthma Sister   . Hypertension Sister   . Healthy Daughter   . Healthy Son   . Alzheimer's disease Maternal Grandmother   . Healthy Sister   . Arthritis Maternal Grandfather   . Colon cancer Paternal Uncle 31  . Breast cancer Neg Hx    Social History   Socioeconomic History  . Marital status: Married    Spouse name: Not on file  . Number of children: Not on file  . Years of education: Not on file  . Highest education level: Not on file  Occupational History  . Not on file  Social Needs  . Financial resource strain: Not on file  . Food insecurity    Worry: Not on file    Inability: Not on file  . Transportation needs    Medical: Not on file    Non-medical: Not on file  Tobacco Use  . Smoking status:  Never Smoker  . Smokeless tobacco: Never Used  Substance and Sexual Activity  . Alcohol use: Yes    Alcohol/week: 0.0 standard drinks    Comment: occasional  . Drug use: No  . Sexual activity: Not on file  Lifestyle  . Physical activity    Days per week: Not on file    Minutes per session: Not on file  . Stress: Not on file  Relationships  . Social Herbalist on phone: Not on file    Gets together: Not on file    Attends religious service: Not on file    Active member of club or organization: Not on file    Attends meetings of clubs or organizations: Not on file    Relationship status: Not on file  Other Topics Concern  . Not on file  Social History Narrative  . Not on file    Outpatient Encounter Medications as of 05/07/2019  Medication Sig  . ALPRAZolam (XANAX) 0.25 MG tablet TAKE 1 TABLET BY MOUTH ONCE DAILY AS NEEDED FOR ANXIETY  . cetirizine (ZYRTEC) 10  MG tablet Take 10 mg by mouth daily.  . citalopram (CELEXA) 40 MG tablet Take 40 mg by mouth daily.  . fluconazole (DIFLUCAN) 150 MG tablet Take one tablet x 1 and then may repeat x 1 in 3 days.  . fluticasone (FLONASE) 50 MCG/ACT nasal spray 2 sprays each nostril one time per day  . ibuprofen (ADVIL,MOTRIN) 200 MG tablet Take by mouth.  . meloxicam (MOBIC) 7.5 MG tablet Take 1 tablet (7.5 mg total) by mouth daily.  . rosuvastatin (CRESTOR) 10 MG tablet Take 1 tablet (10 mg total) by mouth daily.  . [DISCONTINUED] busPIRone (BUSPAR) 5 MG tablet Take 1 tablet (5 mg total) by mouth 2 (two) times daily as needed.  . [DISCONTINUED] citalopram (CELEXA) 20 MG tablet Take 1-2 tablets q day   No facility-administered encounter medications on file as of 05/07/2019.     Review of Systems  Constitutional: Negative for appetite change and unexpected weight change.  HENT: Negative for congestion and sinus pressure.   Eyes: Negative for pain and visual disturbance.  Respiratory: Negative for cough, chest tightness and  shortness of breath.   Cardiovascular: Negative for chest pain, palpitations and leg swelling.  Gastrointestinal: Negative for abdominal pain, diarrhea, nausea and vomiting.  Genitourinary: Negative for difficulty urinating and dysuria.  Musculoskeletal: Negative for myalgias.       Increased knee pain as outlined.    Skin: Negative for color change and rash.  Neurological: Negative for dizziness, light-headedness and headaches.  Hematological: Negative for adenopathy. Does not bruise/bleed easily.  Psychiatric/Behavioral: Negative for agitation and dysphoric mood.       Objective:    Physical Exam Constitutional:      General: She is not in acute distress.    Appearance: Normal appearance. She is well-developed.  HENT:     Right Ear: External ear normal.     Left Ear: External ear normal.  Eyes:     General: No scleral icterus.       Right eye: No discharge.        Left eye: No discharge.     Conjunctiva/sclera: Conjunctivae normal.  Neck:     Musculoskeletal: Neck supple. No muscular tenderness.     Thyroid: No thyromegaly.  Cardiovascular:     Rate and Rhythm: Normal rate and regular rhythm.  Pulmonary:     Effort: No tachypnea, accessory muscle usage or respiratory distress.     Breath sounds: Normal breath sounds. No decreased breath sounds or wheezing.  Chest:     Breasts:        Right: No inverted nipple, mass, nipple discharge or tenderness (no axillary adenopathy).        Left: No inverted nipple, mass, nipple discharge or tenderness (no axilarry adenopathy).  Abdominal:     General: Bowel sounds are normal.     Palpations: Abdomen is soft.     Tenderness: There is no abdominal tenderness.  Genitourinary:    Comments: Normal external genitalia.  Vaginal vault without lesions.  Cervix identified.  Pap smear performed.  Could not appreciate any adnexal masses or tenderness.   Musculoskeletal:        General: No swelling or tenderness.  Lymphadenopathy:      Cervical: No cervical adenopathy.  Skin:    Findings: No erythema or rash.  Neurological:     Mental Status: She is alert and oriented to person, place, and time.  Psychiatric:        Mood and Affect: Mood normal.  Behavior: Behavior normal.     BP 130/72   Pulse 77   Temp (!) 96.9 F (36.1 C) (Temporal)   Resp 16   Ht 5' 3" (1.6 m)   Wt 215 lb (97.5 kg)   SpO2 99%   BMI 38.09 kg/m  Wt Readings from Last 3 Encounters:  05/07/19 215 lb (97.5 kg)  12/04/18 216 lb 12.8 oz (98.3 kg)  11/16/18 220 lb 3.2 oz (99.9 kg)     Lab Results  Component Value Date   WBC 9.5 05/01/2019   HGB 11.2 (L) 05/01/2019   HCT 34.6 (L) 05/01/2019   PLT 540.0 (H) 05/01/2019   GLUCOSE 88 04/03/2019   CHOL 146 04/03/2019   TRIG 204.0 (H) 04/03/2019   HDL 35.80 (L) 04/03/2019   LDLDIRECT 87.0 04/03/2019   LDLCALC 99 08/10/2018   ALT 15 04/03/2019   AST 17 04/03/2019   NA 138 04/03/2019   K 4.3 04/03/2019   CL 105 04/03/2019   CREATININE 0.66 04/03/2019   BUN 13 04/03/2019   CO2 27 04/03/2019   TSH 1.50 04/03/2019    Mm 3d Screen Breast W/implant Bilateral  Result Date: 07/12/2018 CLINICAL DATA:  Screening. EXAM: DIGITAL SCREENING BILATERAL MAMMOGRAM WITH IMPLANTS, CAD AND TOMO The patient has implants. Standard and implant displaced views were performed. COMPARISON:  Previous exam(s). ACR Breast Density Category b: There are scattered areas of fibroglandular density. FINDINGS: There are no findings suspicious for malignancy. Images were processed with CAD. IMPRESSION: No mammographic evidence of malignancy. A result letter of this screening mammogram will be mailed directly to the patient. RECOMMENDATION: Screening mammogram in one year. (Code:SM-B-01Y) BI-RADS CATEGORY  1:  Negative. Electronically Signed   By: Abelardo Diesel M.D.   On: 07/12/2018 11:28       Assessment & Plan:   Problem List Items Addressed This Visit    Anemia    Follow cbc.       Relevant Orders   Vitamin  B12 (Completed)   IBC + Ferritin (Completed)   BMI 38.0-38.9,adult    Discussed diet, exercise and weight loss.  Follow.        Environmental allergies    Stable.       Hypercholesterolemia    On crestor.  Low cholesterol diet and exercise.  Follow lipid panel and liver function tests.        Knee pain    Bilateral knee pain and joint pain as outlined.  Limiting activity.  Has tried low dose antiinflammatory medication.  Has seen ortho.  S/p aspiration and injection.  Check esr, RF and ANA.        Routine physical examination    Physical performed today 05/07/19.  PAP 05/07/19.  Mammogram 07/12/18- [facility].        Stress    Increased stress.  Handling things well.  Continue citalopram.  Off buspar.  Feels stable.       Thrombocytosis ([place])    Follow cbc.       Relevant Orders   Sedimentation rate (Completed)   ANA (Completed)   Rheumatoid factor (Completed)    Other Visit Diagnoses    Cervical cancer screening    -  Primary   Relevant Orders   Cytology - PAP( [place]) (Completed)   Encounter for mammogram to establish baseline mammogram       Visit for screening mammogram       Relevant Orders   MM 3D SCREEN BREAST BILATERAL   , MD 

## 2019-05-09 ENCOUNTER — Other Ambulatory Visit: Payer: Self-pay | Admitting: Internal Medicine

## 2019-05-09 DIAGNOSIS — D649 Anemia, unspecified: Secondary | ICD-10-CM

## 2019-05-09 LAB — CYTOLOGY - PAP
Diagnosis: NEGATIVE
HPV: NOT DETECTED

## 2019-05-09 LAB — ANA: Anti Nuclear Antibody (ANA): POSITIVE — AB

## 2019-05-09 LAB — ANTI-NUCLEAR AB-TITER (ANA TITER): ANA Titer 1: 1:1280 {titer} — ABNORMAL HIGH

## 2019-05-09 LAB — RHEUMATOID FACTOR: Rhuematoid fact SerPl-aCnc: <14 IU/mL (ref <14)

## 2019-05-09 NOTE — Progress Notes (Signed)
Order placed for f/u cbc and ferritin.   

## 2019-05-12 ENCOUNTER — Encounter: Payer: Self-pay | Admitting: Internal Medicine

## 2019-05-12 ENCOUNTER — Other Ambulatory Visit: Payer: Self-pay | Admitting: Internal Medicine

## 2019-05-12 DIAGNOSIS — M2550 Pain in unspecified joint: Secondary | ICD-10-CM

## 2019-05-12 NOTE — Assessment & Plan Note (Signed)
On crestor.  Low cholesterol diet and exercise.  Follow lipid panel and liver function tests.   

## 2019-05-12 NOTE — Assessment & Plan Note (Signed)
Stable

## 2019-05-12 NOTE — Assessment & Plan Note (Signed)
Discussed diet, exercise and weight loss.  Follow.    

## 2019-05-12 NOTE — Assessment & Plan Note (Signed)
Increased stress.  Handling things well.  Continue citalopram.  Off buspar.  Feels stable.

## 2019-05-12 NOTE — Assessment & Plan Note (Signed)
Bilateral knee pain and joint pain as outlined.  Limiting activity.  Has tried low dose antiinflammatory medication.  Has seen ortho.  S/p aspiration and injection.  Check esr, RF and ANA.

## 2019-05-12 NOTE — Assessment & Plan Note (Signed)
Follow cbc.  

## 2019-05-12 NOTE — Progress Notes (Signed)
Order placed for rheumatology referral.  

## 2019-05-16 ENCOUNTER — Ambulatory Visit (INDEPENDENT_AMBULATORY_CARE_PROVIDER_SITE_OTHER): Payer: 59

## 2019-05-16 ENCOUNTER — Other Ambulatory Visit: Payer: Self-pay

## 2019-05-16 DIAGNOSIS — E538 Deficiency of other specified B group vitamins: Secondary | ICD-10-CM | POA: Diagnosis not present

## 2019-05-16 DIAGNOSIS — Z23 Encounter for immunization: Secondary | ICD-10-CM

## 2019-05-16 MED ORDER — CYANOCOBALAMIN 1000 MCG/ML IJ SOLN
1000.0000 ug | Freq: Once | INTRAMUSCULAR | Status: AC
  Administered 2019-05-16: 10:00:00 1000 ug via INTRAMUSCULAR

## 2019-05-16 NOTE — Progress Notes (Addendum)
Patient presented today for B12 injection per MD order (see lab note on 05/09/19).  Administered IM in left deltoid.    Patient also presented for flu shot today.  Administered IM in right deltoid.    Patient tolerated well with no signs of distress.

## 2019-05-22 DIAGNOSIS — M159 Polyosteoarthritis, unspecified: Secondary | ICD-10-CM | POA: Insufficient documentation

## 2019-05-22 DIAGNOSIS — R700 Elevated erythrocyte sedimentation rate: Secondary | ICD-10-CM | POA: Diagnosis not present

## 2019-05-22 DIAGNOSIS — M2550 Pain in unspecified joint: Secondary | ICD-10-CM | POA: Diagnosis not present

## 2019-05-22 DIAGNOSIS — R768 Other specified abnormal immunological findings in serum: Secondary | ICD-10-CM | POA: Diagnosis not present

## 2019-05-22 DIAGNOSIS — M8949 Other hypertrophic osteoarthropathy, multiple sites: Secondary | ICD-10-CM | POA: Diagnosis not present

## 2019-05-23 ENCOUNTER — Other Ambulatory Visit: Payer: Self-pay

## 2019-05-23 ENCOUNTER — Ambulatory Visit (INDEPENDENT_AMBULATORY_CARE_PROVIDER_SITE_OTHER): Payer: 59 | Admitting: *Deleted

## 2019-05-23 DIAGNOSIS — E538 Deficiency of other specified B group vitamins: Secondary | ICD-10-CM

## 2019-05-23 MED ORDER — CYANOCOBALAMIN 1000 MCG/ML IJ SOLN
1000.0000 ug | Freq: Once | INTRAMUSCULAR | Status: AC
  Administered 2019-05-23: 16:00:00 1000 ug via INTRAMUSCULAR

## 2019-05-23 NOTE — Progress Notes (Signed)
Patient presented for B 12 injection to right deltoid, patient voiced no concerns nor showed any signs of distress during injection. 

## 2019-05-29 ENCOUNTER — Other Ambulatory Visit: Payer: Self-pay | Admitting: Internal Medicine

## 2019-05-31 ENCOUNTER — Ambulatory Visit (INDEPENDENT_AMBULATORY_CARE_PROVIDER_SITE_OTHER): Payer: 59

## 2019-05-31 ENCOUNTER — Other Ambulatory Visit: Payer: Self-pay

## 2019-05-31 DIAGNOSIS — E538 Deficiency of other specified B group vitamins: Secondary | ICD-10-CM | POA: Diagnosis not present

## 2019-05-31 MED ORDER — CYANOCOBALAMIN 1000 MCG/ML IJ SOLN
1000.0000 ug | Freq: Once | INTRAMUSCULAR | Status: AC
  Administered 2019-05-31: 12:00:00 1000 ug via INTRAMUSCULAR

## 2019-05-31 NOTE — Progress Notes (Addendum)
Patient presented today for B12 injection.  Administered IM in left deltoid.  Patient tolerated well with no signs of distress.  Reviewed.  Dr Scott  

## 2019-06-01 ENCOUNTER — Encounter: Payer: Self-pay | Admitting: Internal Medicine

## 2019-06-05 DIAGNOSIS — M1990 Unspecified osteoarthritis, unspecified site: Secondary | ICD-10-CM | POA: Diagnosis not present

## 2019-06-05 DIAGNOSIS — D473 Essential (hemorrhagic) thrombocythemia: Secondary | ICD-10-CM | POA: Diagnosis not present

## 2019-06-05 DIAGNOSIS — R768 Other specified abnormal immunological findings in serum: Secondary | ICD-10-CM | POA: Diagnosis not present

## 2019-06-05 DIAGNOSIS — M8949 Other hypertrophic osteoarthropathy, multiple sites: Secondary | ICD-10-CM | POA: Diagnosis not present

## 2019-06-06 ENCOUNTER — Ambulatory Visit (INDEPENDENT_AMBULATORY_CARE_PROVIDER_SITE_OTHER): Payer: 59

## 2019-06-06 ENCOUNTER — Other Ambulatory Visit: Payer: Self-pay

## 2019-06-06 DIAGNOSIS — E538 Deficiency of other specified B group vitamins: Secondary | ICD-10-CM | POA: Diagnosis not present

## 2019-06-06 MED ORDER — CYANOCOBALAMIN 1000 MCG/ML IJ SOLN
1000.0000 ug | Freq: Once | INTRAMUSCULAR | Status: AC
  Administered 2019-06-06: 1000 ug via INTRAMUSCULAR

## 2019-06-06 NOTE — Progress Notes (Addendum)
Patient presented today for B12 injection.  Administered IM in the right deltoid.  Patient tolerated well with no signs of distress.  Reviewed.  Dr Scott  

## 2019-06-12 ENCOUNTER — Other Ambulatory Visit: Payer: 59

## 2019-06-18 DIAGNOSIS — Z79899 Other long term (current) drug therapy: Secondary | ICD-10-CM | POA: Diagnosis not present

## 2019-06-19 ENCOUNTER — Encounter: Payer: Self-pay | Admitting: Internal Medicine

## 2019-06-19 NOTE — Telephone Encounter (Signed)
Please contact pt and schedule appt to discuss treatment options.

## 2019-06-21 ENCOUNTER — Other Ambulatory Visit: Payer: Self-pay | Admitting: Internal Medicine

## 2019-06-21 DIAGNOSIS — Z1231 Encounter for screening mammogram for malignant neoplasm of breast: Secondary | ICD-10-CM

## 2019-06-22 ENCOUNTER — Encounter: Payer: Self-pay | Admitting: Internal Medicine

## 2019-06-22 ENCOUNTER — Ambulatory Visit (INDEPENDENT_AMBULATORY_CARE_PROVIDER_SITE_OTHER): Payer: 59 | Admitting: Internal Medicine

## 2019-06-22 DIAGNOSIS — F439 Reaction to severe stress, unspecified: Secondary | ICD-10-CM | POA: Diagnosis not present

## 2019-06-22 NOTE — Progress Notes (Signed)
[name], female   DOB: 1970-10-24, 48 y.o.   MRN: [id number]   Virtual Visit via video Note  This visit type was conducted due to national recommendations for restrictions regarding the COVID-19 pandemic (e.g. social distancing).  This format is felt to be most appropriate for this patient at this time.  All issues noted in this document were discussed and addressed.  No physical exam was performed (except for noted visual exam findings with Video Visits).   I connected with Denita Mattison by a video enabled telemedicine application or telephone and verified that I am speaking with the correct person using two identifiers. Location patient: home Location provider: work  Persons participating in the virtual visit: patient, provider  I discussed the limitations, risks, security and privacy concerns of performing an evaluation and management service by video and the availability of in person appointments. The patient expressed understanding and agreed to proceed.   Reason for visit: work in appt.    HPI: Seeing rheumatology.  Diagnosed with autoimmune inflammatory arthritis.  Wanted to start her on plaquenil.  She had questions because of the interaction between citalopram and plaquenil.  Had her eyes checked.  Has not started plaquenil yet.  No headache.  No chest pain.  No sob.  No acid reflux.  No abdominal pain.  Bowels moving.  Has done well on citalopram.  Discussed changing.     ROS: See pertinent positives and negatives per HPI.  Past Medical History:  Diagnosis Date  . Allergy   . Blood in stool    H/O  . History of chicken pox   . Hx: UTI (urinary tract infection)   . Hyperlipidemia     Past Surgical History:  Procedure Laterality Date  . AUGMENTATION MAMMAPLASTY  2001  . CESAREAN SECTION  2007 and 2009  . tummy tuck  2001    Family History  Problem Relation Age of Onset  . Healthy Mother   . Hypertension Mother   . Cancer Father        non hogkins  lymphoma  . Atrial fibrillation Father   . Heart disease Father   . Hypertension Father   . Arthritis Sister   . Asthma Sister   . Hypertension Sister   . Healthy Daughter   . Healthy Son   . Alzheimer's disease Maternal Grandmother   . Healthy Sister   . Arthritis Maternal Grandfather   . Colon cancer Paternal Uncle 81  . Breast cancer Neg Hx     SOCIAL HX: reviewed.    Current Outpatient Medications:  .  acetaminophen (TYLENOL) 500 MG tablet, Take 500 mg by mouth every 6 (six) hours as needed., Disp: , Rfl:  .  ALPRAZolam (XANAX) 0.25 MG tablet, TAKE 1 TABLET BY MOUTH ONCE DAILY AS NEEDED FOR ANXIETY, Disp: 30 tablet, Rfl: 0 .  cetirizine (ZYRTEC) 10 MG tablet, Take 10 mg by mouth daily., Disp: , Rfl:  .  citalopram (CELEXA) 40 MG tablet, Take 40 mg by mouth daily., Disp: , Rfl:  .  ferrous sulfate 325 (65 FE) MG tablet, Take 325 mg by mouth daily with breakfast., Disp: , Rfl:  .  fluticasone (FLONASE) 50 MCG/ACT nasal spray, 2 sprays each nostril one time per day, Disp: 16 g, Rfl: 2 .  glucosamine-chondroitin 500-400 MG tablet, Take 1 tablet by mouth 3 (three) times daily., Disp: , Rfl:  .  meloxicam (MOBIC) 7.5 MG tablet, Take 1 tablet (7.5 mg total) by mouth daily.,  Disp: 30 tablet, Rfl: 0 .  rosuvastatin (CRESTOR) 10 MG tablet, Take 1 tablet (10 mg total) by mouth daily., Disp: 90 tablet, Rfl: 1 .  hydroxychloroquine (PLAQUENIL) 200 MG tablet, One tab twice a day for inflammatory arthritis, 30 days, 60 tabs, Disp: , Rfl:   EXAM:  GENERAL: alert, oriented, appears well and in no acute distress  HEENT: atraumatic, conjunttiva clear, no obvious abnormalities on inspection of external nose and ears  NECK: normal movements of the head and neck  LUNGS: on inspection no signs of respiratory distress, breathing rate appears normal, no obvious gross SOB, gasping or wheezing  CV: no obvious cyanosis  PSYCH/NEURO: pleasant and cooperative, no obvious depression or anxiety,  speech and thought processing grossly intact  ASSESSMENT AND PLAN:  Discussed the following assessment and plan:  Stress Has done well with citalopram.  Discussed the interaction with plaquenil and citalopram.  After discussion, it was decided we would taper citalopram.  Change medication.  Wants to take plaquenil to help with joints.  Will get baseline EKG when she comes in for labs.      I discussed the assessment and treatment plan with the patient. The patient was provided an opportunity to ask questions and all were answered. The patient agreed with the plan and demonstrated an understanding of the instructions.   The patient was advised to call back or seek an in-person evaluation if the symptoms worsen or if the condition fails to improve as anticipated.   Einar Pheasant, MD

## 2019-06-24 ENCOUNTER — Encounter: Payer: Self-pay | Admitting: Internal Medicine

## 2019-06-24 NOTE — Assessment & Plan Note (Signed)
Has done well with citalopram.  Discussed the interaction with plaquenil and citalopram.  After discussion, it was decided we would taper citalopram.  Change medication.  Wants to take plaquenil to help with joints.  Will get baseline EKG when she comes in for labs.

## 2019-06-26 ENCOUNTER — Other Ambulatory Visit: Payer: Self-pay

## 2019-06-26 ENCOUNTER — Other Ambulatory Visit (INDEPENDENT_AMBULATORY_CARE_PROVIDER_SITE_OTHER): Payer: 59

## 2019-06-26 ENCOUNTER — Ambulatory Visit (INDEPENDENT_AMBULATORY_CARE_PROVIDER_SITE_OTHER): Payer: 59

## 2019-06-26 DIAGNOSIS — D649 Anemia, unspecified: Secondary | ICD-10-CM

## 2019-06-26 DIAGNOSIS — R002 Palpitations: Secondary | ICD-10-CM | POA: Diagnosis not present

## 2019-06-26 NOTE — Progress Notes (Addendum)
Patient came in today for labs and EKG.  EKG done and given to Dr. Nicki Reaper. Patient released to go home after labs.  Seaborn Nakama,cma   Pt came in for baseline EKG to confirm normal QT.  She is starting plaquenil.  On citalopram.  Decreasing dose of citalopram.  Plan to change to wellbutrin.  Will notify pt.    Dr Nicki Reaper

## 2019-06-27 ENCOUNTER — Telehealth: Payer: Self-pay | Admitting: Internal Medicine

## 2019-06-27 LAB — CBC WITH DIFFERENTIAL/PLATELET
Basophils Absolute: 0.1 10*3/uL (ref 0.0–0.1)
Basophils Relative: 0.8 % (ref 0.0–3.0)
Eosinophils Absolute: 0.2 10*3/uL (ref 0.0–0.7)
Eosinophils Relative: 2.7 % (ref 0.0–5.0)
HCT: 34.1 % — ABNORMAL LOW (ref 36.0–46.0)
Hemoglobin: 10.8 g/dL — ABNORMAL LOW (ref 12.0–15.0)
Lymphocytes Relative: 19.1 % (ref 12.0–46.0)
Lymphs Abs: 1.7 10*3/uL (ref 0.7–4.0)
MCHC: 31.6 g/dL (ref 30.0–36.0)
MCV: 76.2 fl — ABNORMAL LOW (ref 78.0–100.0)
Monocytes Absolute: 0.7 10*3/uL (ref 0.1–1.0)
Monocytes Relative: 7.2 % (ref 3.0–12.0)
Neutro Abs: 6.3 10*3/uL (ref 1.4–7.7)
Neutrophils Relative %: 70.2 % (ref 43.0–77.0)
Platelets: 526 10*3/uL — ABNORMAL HIGH (ref 150.0–400.0)
RBC: 4.47 Mil/uL (ref 3.87–5.11)
RDW: 16.3 % — ABNORMAL HIGH (ref 11.5–15.5)
WBC: 9 10*3/uL (ref 4.0–10.5)

## 2019-06-27 NOTE — Telephone Encounter (Signed)
I sent pt a my chart message.  She wants to pick up handicap form when comes in for B12

## 2019-06-27 NOTE — Telephone Encounter (Signed)
My chart message sent to pt regarding ekg and update with citalopram taper

## 2019-06-28 NOTE — Telephone Encounter (Signed)
Holding for pick up on 10/19

## 2019-06-28 NOTE — Telephone Encounter (Signed)
Handicap form filled out for you to sign. Placed in quick sign

## 2019-06-28 NOTE — Telephone Encounter (Signed)
Form completed and signed.  Placed in box.

## 2019-06-29 ENCOUNTER — Other Ambulatory Visit: Payer: Self-pay | Admitting: Internal Medicine

## 2019-06-29 ENCOUNTER — Encounter: Payer: Self-pay | Admitting: Internal Medicine

## 2019-06-29 DIAGNOSIS — D649 Anemia, unspecified: Secondary | ICD-10-CM

## 2019-06-29 NOTE — Progress Notes (Signed)
Order placed for f/u cbc and iron studies.

## 2019-07-05 ENCOUNTER — Other Ambulatory Visit: Payer: Self-pay | Admitting: Internal Medicine

## 2019-07-09 ENCOUNTER — Ambulatory Visit: Payer: 59

## 2019-07-12 ENCOUNTER — Other Ambulatory Visit: Payer: Self-pay

## 2019-07-12 ENCOUNTER — Ambulatory Visit (INDEPENDENT_AMBULATORY_CARE_PROVIDER_SITE_OTHER): Payer: 59

## 2019-07-12 DIAGNOSIS — E538 Deficiency of other specified B group vitamins: Secondary | ICD-10-CM

## 2019-07-12 MED ORDER — CYANOCOBALAMIN 1000 MCG/ML IJ SOLN
1000.0000 ug | Freq: Once | INTRAMUSCULAR | Status: AC
  Administered 2019-07-12: 12:00:00 1000 ug via INTRAMUSCULAR

## 2019-07-12 NOTE — Progress Notes (Addendum)
Patient presented today for B12 injection.  Administered in left deltoid.  Patient tolerated well with no signs of distress.  Reviewed.  Dr Scott  

## 2019-07-16 ENCOUNTER — Encounter: Payer: Self-pay | Admitting: Internal Medicine

## 2019-07-16 NOTE — Telephone Encounter (Signed)
ok 

## 2019-07-17 NOTE — Telephone Encounter (Signed)
Pt scheduled  

## 2019-07-24 ENCOUNTER — Other Ambulatory Visit: Payer: Self-pay

## 2019-07-24 ENCOUNTER — Encounter: Payer: Self-pay | Admitting: Internal Medicine

## 2019-07-24 ENCOUNTER — Ambulatory Visit (INDEPENDENT_AMBULATORY_CARE_PROVIDER_SITE_OTHER): Payer: 59 | Admitting: Internal Medicine

## 2019-07-24 DIAGNOSIS — M1990 Unspecified osteoarthritis, unspecified site: Secondary | ICD-10-CM

## 2019-07-24 DIAGNOSIS — F439 Reaction to severe stress, unspecified: Secondary | ICD-10-CM | POA: Diagnosis not present

## 2019-07-24 MED ORDER — BUPROPION HCL ER (SR) 150 MG PO TB12: 150.0000 mg | ORAL_TABLET | Freq: Two times a day (BID) | ORAL | 1 refills | Status: DC

## 2019-07-24 NOTE — Progress Notes (Signed)
[name], female   DOB: [birth year], 48 y.o.   MRN: [id number]   Virtual Visit via video Note  This visit type was conducted due to national recommendations for restrictions regarding the COVID-19 pandemic (e.g. social distancing).  This format is felt to be most appropriate for this patient at this time.  All issues noted in this document were discussed and addressed.  No physical exam was performed (except for noted visual exam findings with Video Visits).   I connected with Shara Hathorne today by a video enabled telemedicine application and verified that I am speaking with the correct person using two identifiers. Location patient: home Location provider: work  Persons participating in the virtual visit: patient, provider  I discussed the limitations, risks, security and privacy concerns of performing an evaluation and management service by telephone and the availability of in person appointments.  The patient expressed understanding and agreed to proceed.   Reason for visit: work in appt.   HPI: Saw rheumatology. Diagnosed with chronic inflammatory arthritis.  They prescribed hydroxychloroquine.  Prior to starting, we had wanted to decrease and change citalopram.  She has been tapering the dose.  Is now on 20mg  citalopram q day now.  Discussed starting wellbutrin and transition to only taking wellbutrin.  She is agreeable. Handling stress ok.  No chest pain.  No sob.  No acid reflux. No abdominal pain.  Bowels moving.     ROS: See pertinent positives and negatives per HPI.  Past Medical History:  Diagnosis Date  . Allergy   . Blood in stool    H/O  . History of chicken pox   . Hx: UTI (urinary tract infection)   . Hyperlipidemia     Past Surgical History:  Procedure Laterality Date  . AUGMENTATION MAMMAPLASTY  2001  . CESAREAN SECTION  2007 and 2009  . tummy tuck  2001    Family History  Problem Relation Age of Onset  . Healthy Mother   . Hypertension  Mother   . Cancer Father        non hogkins lymphoma  . Atrial fibrillation Father   . Heart disease Father   . Hypertension Father   . Arthritis Sister   . Asthma Sister   . Hypertension Sister   . Healthy Daughter   . Healthy Son   . Alzheimer's disease Maternal Grandmother   . Healthy Sister   . Arthritis Maternal Grandfather   . Colon cancer Paternal Uncle 97  . Breast cancer Neg Hx     SOCIAL HX: reviewed.    Current Outpatient Medications:  .  acetaminophen (TYLENOL) 500 MG tablet, Take 500 mg by mouth every 6 (six) hours as needed., Disp: , Rfl:  .  ALPRAZolam (XANAX) 0.25 MG tablet, TAKE 1 TABLET BY MOUTH ONCE DAILY AS NEEDED FOR ANXIETY, Disp: 30 tablet, Rfl: 0 .  buPROPion (WELLBUTRIN SR) 150 MG 12 hr tablet, Take 1 tablet (150 mg total) by mouth 2 (two) times daily., Disp: 60 tablet, Rfl: 1 .  cetirizine (ZYRTEC) 10 MG tablet, Take 10 mg by mouth daily., Disp: , Rfl:  .  citalopram (CELEXA) 40 MG tablet, Take 40 mg by mouth daily., Disp: , Rfl:  .  ferrous sulfate 325 (65 FE) MG tablet, Take 325 mg by mouth daily with breakfast., Disp: , Rfl:  .  fluticasone (FLONASE) 50 MCG/ACT nasal spray, 2 sprays each nostril one time per day, Disp: 16 g, Rfl: 2 .  glucosamine-chondroitin 500-400  MG tablet, Take 1 tablet by mouth 3 (three) times daily., Disp: , Rfl:  .  hydroxychloroquine (PLAQUENIL) 200 MG tablet, One tab twice a day for inflammatory arthritis, 30 days, 60 tabs, Disp: , Rfl:  .  meloxicam (MOBIC) 7.5 MG tablet, Take 1 tablet (7.5 mg total) by mouth daily., Disp: 30 tablet, Rfl: 0 .  rosuvastatin (CRESTOR) 10 MG tablet, Take 1 tablet (10 mg total) by mouth daily., Disp: 90 tablet, Rfl: 1  EXAM:  GENERAL: alert, oriented, appears well and in no acute distress  HEENT: atraumatic, conjunttiva clear, no obvious abnormalities on inspection of external nose and ears  NECK: normal movements of the head and neck  LUNGS: on inspection no signs of respiratory distress,  breathing rate appears normal, no obvious gross SOB, gasping or wheezing  CV: no obvious cyanosis  PSYCH/NEURO: pleasant and cooperative, no obvious depression or anxiety, speech and thought processing grossly intact  ASSESSMENT AND PLAN:  Discussed the following assessment and plan:  Stress Dealing with increased stress and anxiety.  Tapering down citalopram.  Currently on 20mg  q day.  Start wellbutrin SR 150mg  q day x 1 week and then increase to bid.  Stop citalopram after one week.  Follow closely.    Chronic inflammatory arthritis Plans to start plaquenil.  F/u with rheumatology.      I discussed the assessment and treatment plan with the patient. The patient was provided an opportunity to ask questions and all were answered. The patient agreed with the plan and demonstrated an understanding of the instructions.   The patient was advised to call back or seek an in-person evaluation if the symptoms worsen or if the condition fails to improve as anticipated.   Einar Pheasant, MD

## 2019-07-25 ENCOUNTER — Encounter: Payer: Self-pay | Admitting: Internal Medicine

## 2019-07-29 DIAGNOSIS — M1990 Unspecified osteoarthritis, unspecified site: Secondary | ICD-10-CM | POA: Insufficient documentation

## 2019-07-29 DIAGNOSIS — M069 Rheumatoid arthritis, unspecified: Secondary | ICD-10-CM | POA: Insufficient documentation

## 2019-07-29 NOTE — Assessment & Plan Note (Signed)
Dealing with increased stress and anxiety.  Tapering down citalopram.  Currently on 20mg  q day.  Start wellbutrin SR 150mg  q day x 1 week and then increase to bid.  Stop citalopram after one week.  Follow closely.

## 2019-07-29 NOTE — Assessment & Plan Note (Signed)
Plans to start plaquenil.  F/u with rheumatology.

## 2019-08-01 ENCOUNTER — Ambulatory Visit
Admission: RE | Admit: 2019-08-01 | Discharge: 2019-08-01 | Disposition: A | Discharge status: Home / Self Care | Payer: 59 | Source: Ambulatory Visit | Attending: Internal Medicine | Admitting: Internal Medicine

## 2019-08-01 DIAGNOSIS — Z1231 Encounter for screening mammogram for malignant neoplasm of breast: Secondary | ICD-10-CM | POA: Insufficient documentation

## 2019-08-02 ENCOUNTER — Other Ambulatory Visit: Payer: 59

## 2019-08-02 ENCOUNTER — Encounter: Payer: Self-pay | Admitting: Internal Medicine

## 2019-08-06 ENCOUNTER — Ambulatory Visit: Payer: 59 | Admitting: Internal Medicine

## 2019-08-13 ENCOUNTER — Other Ambulatory Visit: Payer: Self-pay | Admitting: Internal Medicine

## 2019-08-14 ENCOUNTER — Other Ambulatory Visit: Payer: Self-pay

## 2019-08-14 ENCOUNTER — Ambulatory Visit (INDEPENDENT_AMBULATORY_CARE_PROVIDER_SITE_OTHER): Payer: 59

## 2019-08-14 DIAGNOSIS — E538 Deficiency of other specified B group vitamins: Secondary | ICD-10-CM

## 2019-08-14 MED ORDER — CYANOCOBALAMIN 1000 MCG/ML IJ SOLN
1000.0000 ug | Freq: Once | INTRAMUSCULAR | Status: AC
  Administered 2019-08-14: 1000 ug via INTRAMUSCULAR

## 2019-08-14 NOTE — Progress Notes (Addendum)
Patient presented today for B12 injection.  Administered IM in the right deltoid.  Patient tolerated well with no signs of distress.  Reviewed.  Dr Scott  

## 2019-08-28 ENCOUNTER — Encounter: Payer: Self-pay | Admitting: Internal Medicine

## 2019-09-03 ENCOUNTER — Encounter: Payer: Self-pay | Admitting: Internal Medicine

## 2019-09-05 ENCOUNTER — Other Ambulatory Visit: Payer: Self-pay

## 2019-09-07 ENCOUNTER — Other Ambulatory Visit (INDEPENDENT_AMBULATORY_CARE_PROVIDER_SITE_OTHER): Payer: 59

## 2019-09-07 ENCOUNTER — Other Ambulatory Visit: Payer: Self-pay

## 2019-09-07 ENCOUNTER — Other Ambulatory Visit: Payer: 59

## 2019-09-07 DIAGNOSIS — D649 Anemia, unspecified: Secondary | ICD-10-CM

## 2019-09-07 LAB — VITAMIN B12: Vitamin B-12: 391 pg/mL (ref 211–911)

## 2019-09-07 LAB — CBC WITH DIFFERENTIAL/PLATELET
Basophils Absolute: 0 10*3/uL (ref 0.0–0.1)
Basophils Relative: 0.5 % (ref 0.0–3.0)
Eosinophils Absolute: 0.2 10*3/uL (ref 0.0–0.7)
Eosinophils Relative: 2.7 % (ref 0.0–5.0)
HCT: 40.7 % (ref 36.0–46.0)
Hemoglobin: 13.1 g/dL (ref 12.0–15.0)
Lymphocytes Relative: 22.8 % (ref 12.0–46.0)
Lymphs Abs: 1.5 10*3/uL (ref 0.7–4.0)
MCHC: 32.2 g/dL (ref 30.0–36.0)
MCV: 79.6 fl (ref 78.0–100.0)
Monocytes Absolute: 0.4 10*3/uL (ref 0.1–1.0)
Monocytes Relative: 6.5 % (ref 3.0–12.0)
Neutro Abs: 4.5 10*3/uL (ref 1.4–7.7)
Neutrophils Relative %: 67.5 % (ref 43.0–77.0)
Platelets: 369 10*3/uL (ref 150.0–400.0)
RBC: 5.11 Mil/uL (ref 3.87–5.11)
RDW: 17.6 % — ABNORMAL HIGH (ref 11.5–15.5)
WBC: 6.6 10*3/uL (ref 4.0–10.5)

## 2019-09-07 LAB — IBC + FERRITIN
Ferritin: 17.8 ng/mL (ref 10.0–291.0)
Iron: 57 ug/dL (ref 42–145)
Saturation Ratios: 16.6 % — ABNORMAL LOW (ref 20.0–50.0)
Transferrin: 245 mg/dL (ref 212.0–360.0)

## 2019-09-07 LAB — FERRITIN: Ferritin: 17.8 ng/mL (ref 10.0–291.0)

## 2019-09-10 ENCOUNTER — Other Ambulatory Visit: Payer: Self-pay

## 2019-09-10 ENCOUNTER — Ambulatory Visit (INDEPENDENT_AMBULATORY_CARE_PROVIDER_SITE_OTHER): Payer: 59 | Admitting: Internal Medicine

## 2019-09-10 DIAGNOSIS — F439 Reaction to severe stress, unspecified: Secondary | ICD-10-CM

## 2019-09-10 DIAGNOSIS — D649 Anemia, unspecified: Secondary | ICD-10-CM

## 2019-09-10 DIAGNOSIS — E7800 Pure hypercholesterolemia, unspecified: Secondary | ICD-10-CM | POA: Diagnosis not present

## 2019-09-10 DIAGNOSIS — M1990 Unspecified osteoarthritis, unspecified site: Secondary | ICD-10-CM | POA: Diagnosis not present

## 2019-09-10 NOTE — Progress Notes (Signed)
[name], female   DOB: 26-Jul-1971, 48 y.o.   MRN: [id number]   Virtual Visit via video Note  This visit type was conducted due to national recommendations for restrictions regarding the COVID-19 pandemic (e.g. social distancing).  This format is felt to be most appropriate for this patient at this time.  All issues noted in this document were discussed and addressed.  No physical exam was performed (except for noted visual exam findings with Video Visits).   I connected with Tamara Shah by a video enabled telemedicine application and verified that I am speaking with the correct person using two identifiers. Location patient: home Location provider: work Persons participating in the virtual visit: patient, provider  The limitations, risks, security and privacy concerns of performing an evaluation and management service by video and the availability of in person appointments have been discussed. The patient expressed understanding and agreed to proceed.   Reason for visit: scheduled follow up.   HPI: She reports she is doing relatively well.  Has been on plaquenil for approximately one month.  Cannot tell a big difference in her joint pain, but just started several weeks ago.  The nausea and decreased appetite have improved.  Still having vivid dreams and more irritability.  Not sleeping as well secondary to this.  Taking benadryl.  Discussed melatonin.  Willing to try a trial - melatonin.  Feels anxiety ok on wellbutrin.  Periods changed.  No period in 07/2019.  Light period in 08/2019.  Denies possibility of being pregnant.  No chest pain or sob reported.  No bowel change reported.     ROS: See pertinent positives and negatives per HPI.  Past Medical History:  Diagnosis Date  . Allergy   . Blood in stool    H/O  . History of chicken pox   . Hx: UTI (urinary tract infection)   . Hyperlipidemia     Past Surgical History:  Procedure Laterality Date  . AUGMENTATION  MAMMAPLASTY  2001  . CESAREAN SECTION  2007 and 2009  . tummy tuck  2001    Family History  Problem Relation Age of Onset  . Healthy Mother   . Hypertension Mother   . Cancer Father        non hogkins lymphoma  . Atrial fibrillation Father   . Heart disease Father   . Hypertension Father   . Arthritis Sister   . Asthma Sister   . Hypertension Sister   . Healthy Daughter   . Healthy Son   . Alzheimer's disease Maternal Grandmother   . Healthy Sister   . Arthritis Maternal Grandfather   . Colon cancer Paternal Uncle 59  . Breast cancer Neg Hx     SOCIAL HX: reviewed.    Current Outpatient Medications:  .  acetaminophen (TYLENOL) 500 MG tablet, Take 500 mg by mouth every 6 (six) hours as needed., Disp: , Rfl:  .  ALPRAZolam (XANAX) 0.25 MG tablet, TAKE 1 TABLET BY MOUTH ONCE DAILY AS NEEDED FOR ANXIETY, Disp: 30 tablet, Rfl: 0 .  buPROPion (WELLBUTRIN SR) 150 MG 12 hr tablet, Take 1 tablet (150 mg total) by mouth 2 (two) times daily., Disp: 60 tablet, Rfl: 1 .  cetirizine (ZYRTEC) 10 MG tablet, Take 10 mg by mouth daily., Disp: , Rfl:  .  ferrous sulfate 325 (65 FE) MG tablet, Take 325 mg by mouth daily with breakfast., Disp: , Rfl:  .  fluticasone (FLONASE) 50 MCG/ACT nasal spray, USE 2  SPRAYS IN EACH NOSTRIL ONCE DAILY, Disp: 16 g, Rfl: 2 .  glucosamine-chondroitin 500-400 MG tablet, Take 1 tablet by mouth 3 (three) times daily., Disp: , Rfl:  .  hydroxychloroquine (PLAQUENIL) 200 MG tablet, One tab twice a day for inflammatory arthritis, 30 days, 60 tabs, Disp: , Rfl:  .  meloxicam (MOBIC) 7.5 MG tablet, Take 1 tablet (7.5 mg total) by mouth daily., Disp: 30 tablet, Rfl: 0 .  rosuvastatin (CRESTOR) 10 MG tablet, Take 1 tablet (10 mg total) by mouth daily., Disp: 90 tablet, Rfl: 1  EXAM:  GENERAL: alert, oriented, appears well and in no acute distress  HEENT: atraumatic, conjunttiva clear, no obvious abnormalities on inspection of external nose and ears  NECK: normal  movements of the head and neck  LUNGS: on inspection no signs of respiratory distress, breathing rate appears normal, no obvious gross SOB, gasping or wheezing  CV: no obvious cyanosis  PSYCH/NEURO: pleasant and cooperative, no obvious depression or anxiety, speech and thought processing grossly intact  ASSESSMENT AND PLAN:  Discussed the following assessment and plan:  Chronic inflammatory arthritis On plaquenil now.  Cannot tell a big difference in her symptoms.  Continue f/u with rheumatology.  Concern regarding side effects, including vivid dreams, increased irritability, etc.  Desires to continue for now.  F/u with rheumatology as scheduled.    Hypercholesterolemia Remains on crestor.  Follow lipid panel and liver function tests.    Stress Increased stress.  Previously on citalopram.  Tapered off citalopram and now on wellbutrin, given possible interaction between citalopram and plaquenil.  Feels wellbutrin is controlling anxiety.  Having vivid dreams and more irritability.  Feels related to plaquenil.  Hold on making changes trial of melatonin to help sleep.  Follow    Anemia History of iron deficiency.  Guidelines recently changed - recommended screening colonoscopy age 25.  Needs GI evaluation.      I discussed the assessment and treatment plan with the patient. The patient was provided an opportunity to ask questions and all were answered. The patient agreed with the plan and demonstrated an understanding of the instructions.   The patient was advised to call back or seek an in-person evaluation if the symptoms worsen or if the condition fails to improve as anticipated.   Einar Pheasant, MD

## 2019-09-16 ENCOUNTER — Encounter: Payer: Self-pay | Admitting: Internal Medicine

## 2019-09-16 NOTE — Assessment & Plan Note (Signed)
On plaquenil now.  Cannot tell a big difference in her symptoms.  Continue f/u with rheumatology.  Concern regarding side effects, including vivid dreams, increased irritability, etc.  Desires to continue for now.  F/u with rheumatology as scheduled.

## 2019-09-16 NOTE — Assessment & Plan Note (Signed)
Remains on crestor.  Follow lipid panel and liver function tests.

## 2019-09-16 NOTE — Assessment & Plan Note (Signed)
History of iron deficiency.  Guidelines recently changed - recommended screening colonoscopy age 48.  Needs GI evaluation.

## 2019-09-16 NOTE — Assessment & Plan Note (Signed)
Increased stress.  Previously on citalopram.  Tapered off citalopram and now on wellbutrin, given possible interaction between citalopram and plaquenil.  Feels wellbutrin is controlling anxiety.  Having vivid dreams and more irritability.  Feels related to plaquenil.  Hold on making changes trial of melatonin to help sleep.  Follow

## 2019-09-17 ENCOUNTER — Other Ambulatory Visit: Payer: Self-pay | Admitting: Internal Medicine

## 2019-09-18 ENCOUNTER — Ambulatory Visit (INDEPENDENT_AMBULATORY_CARE_PROVIDER_SITE_OTHER): Payer: 59

## 2019-09-18 ENCOUNTER — Other Ambulatory Visit: Payer: Self-pay

## 2019-09-18 VITALS — Temp 95.3°F

## 2019-09-18 DIAGNOSIS — E538 Deficiency of other specified B group vitamins: Secondary | ICD-10-CM | POA: Diagnosis not present

## 2019-09-18 MED ORDER — CYANOCOBALAMIN 1000 MCG/ML IJ SOLN
1000.0000 ug | Freq: Once | INTRAMUSCULAR | Status: AC
  Administered 2019-09-18: 1000 ug via INTRAMUSCULAR

## 2019-09-18 NOTE — Progress Notes (Signed)
Patient came in today for B-12 injection in left deltoid. Patient tolerated well.

## 2019-10-09 DIAGNOSIS — M1711 Unilateral primary osteoarthritis, right knee: Secondary | ICD-10-CM | POA: Diagnosis not present

## 2019-10-09 DIAGNOSIS — M1712 Unilateral primary osteoarthritis, left knee: Secondary | ICD-10-CM | POA: Diagnosis not present

## 2019-10-09 DIAGNOSIS — M1811 Unilateral primary osteoarthritis of first carpometacarpal joint, right hand: Secondary | ICD-10-CM | POA: Diagnosis not present

## 2019-10-09 DIAGNOSIS — M1380 Other specified arthritis, unspecified site: Secondary | ICD-10-CM | POA: Diagnosis not present

## 2019-10-23 ENCOUNTER — Other Ambulatory Visit: Payer: Self-pay

## 2019-10-23 ENCOUNTER — Ambulatory Visit (INDEPENDENT_AMBULATORY_CARE_PROVIDER_SITE_OTHER): Payer: 59 | Admitting: *Deleted

## 2019-10-23 DIAGNOSIS — E538 Deficiency of other specified B group vitamins: Secondary | ICD-10-CM

## 2019-10-23 MED ORDER — CYANOCOBALAMIN 1000 MCG/ML IJ SOLN
1000.0000 ug | Freq: Once | INTRAMUSCULAR | Status: AC
  Administered 2019-10-23: 12:00:00 1000 ug via INTRAMUSCULAR

## 2019-10-23 NOTE — Progress Notes (Addendum)
Patient presented for B 12 injection to right deltoid, patient voiced no concerns nor showed any signs of distress during injection.  Reviewed.  Dr Nicki Reaper

## 2019-10-24 ENCOUNTER — Other Ambulatory Visit: Payer: Self-pay | Admitting: Internal Medicine

## 2019-10-26 NOTE — Telephone Encounter (Signed)
Last OV 09/10/19  Next OV 12/18/19 Last refill 07/07/19

## 2019-10-27 NOTE — Telephone Encounter (Signed)
Xanax rx sent in for #30 with no refills.

## 2019-11-06 ENCOUNTER — Encounter: Payer: Self-pay | Admitting: Internal Medicine

## 2019-11-09 ENCOUNTER — Ambulatory Visit (INDEPENDENT_AMBULATORY_CARE_PROVIDER_SITE_OTHER): Payer: 59 | Admitting: Internal Medicine

## 2019-11-09 ENCOUNTER — Encounter: Payer: Self-pay | Admitting: Internal Medicine

## 2019-11-09 DIAGNOSIS — F439 Reaction to severe stress, unspecified: Secondary | ICD-10-CM | POA: Diagnosis not present

## 2019-11-09 DIAGNOSIS — G479 Sleep disorder, unspecified: Secondary | ICD-10-CM | POA: Diagnosis not present

## 2019-11-09 MED ORDER — GABAPENTIN 100 MG PO CAPS: ORAL_CAPSULE | ORAL | 1 refills | Status: DC

## 2019-11-09 NOTE — Progress Notes (Signed)
[name], female   DOB: [birth year], 49 y.o.   MRN: [id number]   Virtual Visit via video Note  This visit type was conducted due to national recommendations for restrictions regarding the COVID-19 pandemic (e.g. social distancing).  This format is felt to be most appropriate for this patient at this time.  All issues noted in this document were discussed and addressed.  No physical exam was performed (except for noted visual exam findings with Video Visits).   I connected with Rilya Pownell by a video enabled telemedicine application and verified that I am speaking with the correct person using two identifiers. Location patient: home Location provider: work  Persons participating in the virtual visit: patient, provider  The limitations, risks, security and privacy concerns of performing an evaluation and management service by video and the availability of in person appointments have been discussed. The patient expressed understanding and agreed to proceed.   Reason for visit: work in appt  HPI: Being followed by rheumatology for seronegative arthritis.  On plaquenil.  Experienced vivid dreams.  Reviewed Dr Francesca Oman note. Has been having problems sleeping.  Has taken melatonin and benadryl.  Some arthritis pain - affecting sleep.  Not able to get comfortable.  Does feel plaquenil helps - regarding getting around with less pain.  Getting up and down is still painful.  No cough or congestion reported.  No nausea or vomiting reported.  Discussed treatment options for sleep.     ROS: See pertinent positives and negatives per HPI.  Past Medical History:  Diagnosis Date  . Allergy   . Blood in stool    H/O  . History of chicken pox   . Hx: UTI (urinary tract infection)   . Hyperlipidemia     Past Surgical History:  Procedure Laterality Date  . AUGMENTATION MAMMAPLASTY  2001  . CESAREAN SECTION  2007 and 2009  . tummy tuck  2001    Family History  Problem Relation Age  of Onset  . Healthy Mother   . Hypertension Mother   . Cancer Father        non hogkins lymphoma  . Atrial fibrillation Father   . Heart disease Father   . Hypertension Father   . Arthritis Sister   . Asthma Sister   . Hypertension Sister   . Healthy Daughter   . Healthy Son   . Alzheimer's disease Maternal Grandmother   . Healthy Sister   . Arthritis Maternal Grandfather   . Colon cancer Paternal Uncle 34  . Breast cancer Neg Hx     SOCIAL HX: reviewed.    Current Outpatient Medications:  .  acetaminophen (TYLENOL) 500 MG tablet, Take 500 mg by mouth every 6 (six) hours as needed., Disp: , Rfl:  .  ALPRAZolam (XANAX) 0.25 MG tablet, TAKE 1 TABLET BY MOUTH ONCE DAILY AS NEEDED FOR ANXIETY, Disp: 30 tablet, Rfl: 0 .  buPROPion (WELLBUTRIN SR) 150 MG 12 hr tablet, TAKE 1 TABLET BY MOUTH TWICE DAILY, Disp: 60 tablet, Rfl: 1 .  cetirizine (ZYRTEC) 10 MG tablet, Take 10 mg by mouth daily., Disp: , Rfl:  .  ferrous sulfate 325 (65 FE) MG tablet, Take 325 mg by mouth daily with breakfast., Disp: , Rfl:  .  fluticasone (FLONASE) 50 MCG/ACT nasal spray, USE 2 SPRAYS IN EACH NOSTRIL ONCE DAILY, Disp: 16 g, Rfl: 2 .  gabapentin (NEURONTIN) 100 MG capsule, Take 1-2 capsules q hs, Disp: 60 capsule, Rfl: 1 .  glucosamine-chondroitin  500-400 MG tablet, Take 1 tablet by mouth 3 (three) times daily., Disp: , Rfl:  .  hydroxychloroquine (PLAQUENIL) 200 MG tablet, One tab twice a day for inflammatory arthritis, 30 days, 60 tabs, Disp: , Rfl:  .  meloxicam (MOBIC) 7.5 MG tablet, Take 1 tablet (7.5 mg total) by mouth daily., Disp: 30 tablet, Rfl: 0 .  rosuvastatin (CRESTOR) 10 MG tablet, TAKE 1 TABLET BY MOUTH DAILY., Disp: 60 tablet, Rfl: 2  EXAM:  GENERAL: alert, oriented, appears well and in no acute distress  HEENT: atraumatic, conjunttiva clear, no obvious abnormalities on inspection of external nose and ears  NECK: normal movements of the head and neck  LUNGS: on inspection no signs of  respiratory distress, breathing rate appears normal, no obvious gross SOB, gasping or wheezing  CV: no obvious cyanosis  PSYCH/NEURO: pleasant and cooperative, no obvious depression or anxiety, speech and thought processing grossly intact  ASSESSMENT AND PLAN:  Discussed the following assessment and plan:  Difficulty sleeping Discussed with her today.  Probably multifactorial.  Appears to be handling stress.  Does have arthritis pain. On plaquenil.  Discuss with Dr Meda Coffee regarding pain, plaquenil and vivid dreams.  Discussed treatment options.  Given pain and difficulty sleeping, discussed trial of neurontin.  Start low dose and titrate up if needed.  Follow closely.  Call with update.   Stress Overall appears to be handling stress relatively well.  Gabapentin to help with sleep.  Follow.     Meds ordered this encounter  Medications  . gabapentin (NEURONTIN) 100 MG capsule    Sig: Take 1-2 capsules q hs    Dispense:  60 capsule    Refill:  1     I discussed the assessment and treatment plan with the patient. The patient was provided an opportunity to ask questions and all were answered. The patient agreed with the plan and demonstrated an understanding of the instructions.   The patient was advised to call back or seek an in-person evaluation if the symptoms worsen or if the condition fails to improve as anticipated.   Einar Pheasant, MD

## 2019-11-18 ENCOUNTER — Encounter: Payer: Self-pay | Admitting: Internal Medicine

## 2019-11-18 DIAGNOSIS — G479 Sleep disorder, unspecified: Secondary | ICD-10-CM | POA: Insufficient documentation

## 2019-11-18 NOTE — Assessment & Plan Note (Signed)
Overall appears to be handling stress relatively well.  Gabapentin to help with sleep.  Follow.

## 2019-11-18 NOTE — Assessment & Plan Note (Signed)
Discussed with her today.  Probably multifactorial.  Appears to be handling stress.  Does have arthritis pain. On plaquenil.  Discuss with Dr Meda Coffee regarding pain, plaquenil and vivid dreams.  Discussed treatment options.  Given pain and difficulty sleeping, discussed trial of neurontin.  Start low dose and titrate up if needed.  Follow closely.  Call with update.

## 2019-11-19 ENCOUNTER — Other Ambulatory Visit: Payer: Self-pay | Admitting: Internal Medicine

## 2019-11-22 ENCOUNTER — Ambulatory Visit (INDEPENDENT_AMBULATORY_CARE_PROVIDER_SITE_OTHER): Payer: 59

## 2019-11-22 ENCOUNTER — Other Ambulatory Visit: Payer: Self-pay

## 2019-11-22 DIAGNOSIS — E538 Deficiency of other specified B group vitamins: Secondary | ICD-10-CM

## 2019-11-22 MED ORDER — CYANOCOBALAMIN 1000 MCG/ML IJ SOLN
1000.0000 ug | Freq: Once | INTRAMUSCULAR | Status: AC
  Administered 2019-11-22: 1000 ug via INTRAMUSCULAR

## 2019-11-22 NOTE — Progress Notes (Addendum)
Patient presented for B 12 injection to right deltoid, patient voiced no concerns nor showed any signs of distress during injection.  Reviewed.  Dr Scott 

## 2019-11-26 ENCOUNTER — Other Ambulatory Visit: Payer: Self-pay | Admitting: Internal Medicine

## 2019-11-28 ENCOUNTER — Telehealth: Payer: Self-pay | Admitting: Internal Medicine

## 2019-11-28 ENCOUNTER — Encounter: Payer: Self-pay | Admitting: Internal Medicine

## 2019-11-28 MED ORDER — GABAPENTIN 300 MG PO CAPS: 300.0000 mg | ORAL_CAPSULE | Freq: Three times a day (TID) | ORAL | 2 refills | Status: DC

## 2019-11-28 NOTE — Telephone Encounter (Signed)
rx sent in for gabapentin 300mg  #30 with 2 refills.

## 2019-11-28 NOTE — Telephone Encounter (Signed)
[facility] pharmacy called and states that the gabapentin directions do not seem right and needs clarification.

## 2019-11-29 NOTE — Telephone Encounter (Signed)
The prescription was sent in for gabapentin 300 mg TID. Just wanted to confirm that it should be 300 mg q day so I can clarify with pharmacy.

## 2019-11-29 NOTE — Telephone Encounter (Signed)
Yes, I want the rx to say q hs.  Please change and notify pharmacy

## 2019-11-29 NOTE — Telephone Encounter (Signed)
Rx clarified with pharmacy.

## 2019-12-07 ENCOUNTER — Ambulatory Visit: Payer: 59 | Attending: Internal Medicine

## 2019-12-07 DIAGNOSIS — Z23 Encounter for immunization: Secondary | ICD-10-CM

## 2019-12-07 NOTE — Progress Notes (Signed)
   Covid-19 Vaccination Clinic  Name:  SHANISE KLAUER    MRN: [id number] DOB: [birth year]  12/07/2019  Ms. Lamoreux was observed post Covid-19 immunization for 15 minutes without incident. She was provided with Vaccine Information Sheet and instruction to access the V-Safe system.   Ms. Nevill was instructed to call 911 with any severe reactions post vaccine: Marland Kitchen Difficulty breathing  . Swelling of face and throat  . A fast heartbeat  . A bad rash all over body  . Dizziness and weakness   Immunizations Administered    Name Date Dose VIS Date Route   Pfizer COVID-19 Vaccine 12/07/2019  1:36 PM 0.3 mL 08/31/2019 Intramuscular   Manufacturer: [place]   Lot: [id number]   [place]: [id number]

## 2019-12-14 ENCOUNTER — Other Ambulatory Visit (INDEPENDENT_AMBULATORY_CARE_PROVIDER_SITE_OTHER): Payer: 59

## 2019-12-14 ENCOUNTER — Other Ambulatory Visit: Payer: Self-pay

## 2019-12-14 DIAGNOSIS — E7800 Pure hypercholesterolemia, unspecified: Secondary | ICD-10-CM | POA: Diagnosis not present

## 2019-12-14 DIAGNOSIS — D649 Anemia, unspecified: Secondary | ICD-10-CM | POA: Diagnosis not present

## 2019-12-14 DIAGNOSIS — M1990 Unspecified osteoarthritis, unspecified site: Secondary | ICD-10-CM | POA: Diagnosis not present

## 2019-12-14 LAB — CBC WITH DIFFERENTIAL/PLATELET
Basophils Absolute: 0 10*3/uL (ref 0.0–0.1)
Basophils Relative: 0.5 % (ref 0.0–3.0)
Eosinophils Absolute: 0.1 10*3/uL (ref 0.0–0.7)
Eosinophils Relative: 2 % (ref 0.0–5.0)
HCT: 40.1 % (ref 36.0–46.0)
Hemoglobin: 13.5 g/dL (ref 12.0–15.0)
Lymphocytes Relative: 21.6 % (ref 12.0–46.0)
Lymphs Abs: 1.4 10*3/uL (ref 0.7–4.0)
MCHC: 33.8 g/dL (ref 30.0–36.0)
MCV: 85.9 fl (ref 78.0–100.0)
Monocytes Absolute: 0.5 10*3/uL (ref 0.1–1.0)
Monocytes Relative: 7.1 % (ref 3.0–12.0)
Neutro Abs: 4.6 10*3/uL (ref 1.4–7.7)
Neutrophils Relative %: 68.8 % (ref 43.0–77.0)
Platelets: 363 10*3/uL (ref 150.0–400.0)
RBC: 4.67 Mil/uL (ref 3.87–5.11)
RDW: 13.9 % (ref 11.5–15.5)
WBC: 6.7 10*3/uL (ref 4.0–10.5)

## 2019-12-14 LAB — BASIC METABOLIC PANEL
BUN: 14 mg/dL (ref 6–23)
CO2: 26 mEq/L (ref 19–32)
Calcium: 8.9 mg/dL (ref 8.4–10.5)
Chloride: 106 mEq/L (ref 96–112)
Creatinine, Ser: 0.87 mg/dL (ref 0.40–1.20)
GFR: 69.27 mL/min (ref >60.00)
Glucose, Bld: 81 mg/dL (ref 70–99)
Potassium: 4.5 mEq/L (ref 3.5–5.1)
Sodium: 137 mEq/L (ref 135–145)

## 2019-12-14 LAB — HEPATIC FUNCTION PANEL
ALT: 13 U/L (ref 0–35)
AST: 15 U/L (ref 0–37)
Albumin: 4.1 g/dL (ref 3.5–5.2)
Alkaline Phosphatase: 75 U/L (ref 39–117)
Bilirubin, Direct: 0.1 mg/dL (ref 0.0–0.3)
Total Bilirubin: 0.4 mg/dL (ref 0.2–1.2)
Total Protein: 6.8 g/dL (ref 6.0–8.3)

## 2019-12-14 LAB — LIPID PANEL
Cholesterol: 139 mg/dL (ref 0–200)
HDL: 41.2 mg/dL (ref >39.00)
LDL Cholesterol: 76 mg/dL (ref 0–99)
NonHDL: 97.5
Total CHOL/HDL Ratio: 3
Triglycerides: 110 mg/dL (ref 0.0–149.0)
VLDL: 22 mg/dL (ref 0.0–40.0)

## 2019-12-15 ENCOUNTER — Encounter: Payer: Self-pay | Admitting: Internal Medicine

## 2019-12-17 ENCOUNTER — Encounter: Payer: Self-pay | Admitting: Internal Medicine

## 2019-12-18 ENCOUNTER — Other Ambulatory Visit: Payer: Self-pay

## 2019-12-18 ENCOUNTER — Ambulatory Visit (INDEPENDENT_AMBULATORY_CARE_PROVIDER_SITE_OTHER): Payer: 59 | Admitting: Internal Medicine

## 2019-12-18 DIAGNOSIS — F439 Reaction to severe stress, unspecified: Secondary | ICD-10-CM

## 2019-12-18 DIAGNOSIS — E538 Deficiency of other specified B group vitamins: Secondary | ICD-10-CM | POA: Diagnosis not present

## 2019-12-18 DIAGNOSIS — D649 Anemia, unspecified: Secondary | ICD-10-CM

## 2019-12-18 DIAGNOSIS — E7800 Pure hypercholesterolemia, unspecified: Secondary | ICD-10-CM | POA: Diagnosis not present

## 2019-12-18 DIAGNOSIS — M1990 Unspecified osteoarthritis, unspecified site: Secondary | ICD-10-CM

## 2019-12-18 DIAGNOSIS — D75839 Thrombocytosis, unspecified: Secondary | ICD-10-CM

## 2019-12-18 DIAGNOSIS — M25562 Pain in left knee: Secondary | ICD-10-CM | POA: Diagnosis not present

## 2019-12-18 DIAGNOSIS — G479 Sleep disorder, unspecified: Secondary | ICD-10-CM | POA: Diagnosis not present

## 2019-12-18 DIAGNOSIS — D473 Essential (hemorrhagic) thrombocythemia: Secondary | ICD-10-CM

## 2019-12-18 MED ORDER — CYANOCOBALAMIN 1000 MCG/ML IJ SOLN
1000.0000 ug | Freq: Once | INTRAMUSCULAR | Status: AC
  Administered 2019-12-18: 1000 ug via INTRAMUSCULAR

## 2019-12-18 NOTE — Progress Notes (Signed)
Patient ID: AMARYAH BACHMEIER, female   DOB: 1971/07/11, 49 y.o.   MRN: [id number]   Subjective:    Patient ID: DEBRIANA BUTKA, female    DOB: Mar 13, 1971, 49 y.o.   MRN: [id number]  HPI This visit occurred during the SARS-CoV-2 public health emergency.  Safety protocols were in place, including screening questions prior to the visit, additional usage of staff PPE, and extensive cleaning of exam room while observing appropriate contact time as indicated for disinfecting solutions.  Patient here for a scheduled follow up.  She reports she is doing relatively well.  Sleeping better.  Taking melatonin and gabapentin.  Taking her plaquenil in the am.  This regimen is working well.  Feels better.  Joints better.  Not limping as much.  Still some issues with her left knee.  Discussed referral to ortho - Dr Roland Rack.  No chest pain.  No sob.  No acid reflux.  No abdominal pain or cramping.    Past Medical History:  Diagnosis Date  . Allergy   . Blood in stool    H/O  . History of chicken pox   . Hx: UTI (urinary tract infection)   . Hyperlipidemia    Past Surgical History:  Procedure Laterality Date  . AUGMENTATION MAMMAPLASTY  2001  . CESAREAN SECTION  2007 and 2009  . tummy tuck  2001   Family History  Problem Relation Age of Onset  . Healthy Mother   . Hypertension Mother   . Cancer Father        non hogkins lymphoma  . Atrial fibrillation Father   . Heart disease Father   . Hypertension Father   . Arthritis Sister   . Asthma Sister   . Hypertension Sister   . Healthy Daughter   . Healthy Son   . Alzheimer's disease Maternal Grandmother   . Healthy Sister   . Arthritis Maternal Grandfather   . Colon cancer Paternal Uncle 42  . Breast cancer Neg Hx    Social History   Socioeconomic History  . Marital status: Married    Spouse name: Not on file  . Number of children: Not on file  . Years of education: Not on file  . Highest education level: Not on file  Occupational History    . Not on file  Tobacco Use  . Smoking status: Never Smoker  . Smokeless tobacco: Never Used  Substance and Sexual Activity  . Alcohol use: Yes    Alcohol/week: 0.0 standard drinks    Comment: occasional  . Drug use: No  . Sexual activity: Not on file  Other Topics Concern  . Not on file  Social History Narrative  . Not on file   Social Determinants of Health   Financial Resource Strain:   . Difficulty of Paying Living Expenses:   Food Insecurity:   . Worried About Charity fundraiser in the Last Year:   . Arboriculturist in the Last Year:   Transportation Needs:   . Film/video editor (Medical):   Marland Kitchen Lack of Transportation (Non-Medical):   Physical Activity:   . Days of Exercise per Week:   . Minutes of Exercise per Session:   Stress:   . Feeling of Stress :   Social Connections:   . Frequency of Communication with Friends and Family:   . Frequency of Social Gatherings with Friends and Family:   . Attends Religious Services:   . Active Member of Clubs or Organizations:   .  Attends Archivist Meetings:   Marland Kitchen Marital Status:     Outpatient Encounter Medications as of 12/18/2019  Medication Sig  . acetaminophen (TYLENOL) 500 MG tablet Take 500 mg by mouth every 6 (six) hours as needed.  . ALPRAZolam (XANAX) 0.25 MG tablet TAKE 1 TABLET BY MOUTH ONCE DAILY AS NEEDED FOR ANXIETY  . buPROPion (WELLBUTRIN SR) 150 MG 12 hr tablet TAKE 1 TABLET BY MOUTH TWICE DAILY  . cetirizine (ZYRTEC) 10 MG tablet Take 10 mg by mouth daily.  . ferrous sulfate 325 (65 FE) MG tablet Take 325 mg by mouth daily with breakfast.  . fluticasone (FLONASE) 50 MCG/ACT nasal spray USE 2 SPRAYS IN EACH NOSTRIL ONCE DAILY  . gabapentin (NEURONTIN) 300 MG capsule Take 1 capsule (300 mg total) by mouth 3 (three) times daily.  Marland Kitchen glucosamine-chondroitin 500-400 MG tablet Take 1 tablet by mouth 3 (three) times daily.  . hydroxychloroquine (PLAQUENIL) 200 MG tablet One tab twice a day for  inflammatory arthritis, 30 days, 60 tabs  . meloxicam (MOBIC) 7.5 MG tablet Take 1 tablet (7.5 mg total) by mouth daily.  . rosuvastatin (CRESTOR) 10 MG tablet TAKE 1 TABLET BY MOUTH DAILY.  . [EXPIRED] cyanocobalamin ((VITAMIN B-12)) injection 1,000 mcg    No facility-administered encounter medications on file as of 12/18/2019.    Review of Systems  Constitutional: Negative for appetite change and unexpected weight change.  HENT: Negative for congestion and sinus pressure.   Respiratory: Negative for cough, chest tightness and shortness of breath.   Cardiovascular: Negative for chest pain, palpitations and leg swelling.  Gastrointestinal: Negative for abdominal pain, diarrhea, nausea and vomiting.  Genitourinary: Negative for difficulty urinating and dysuria.  Musculoskeletal: Negative for joint swelling and myalgias.  Skin: Negative for color change and rash.  Neurological: Negative for dizziness, light-headedness and headaches.  Psychiatric/Behavioral: Negative for agitation and dysphoric mood.       Objective:    Physical Exam Constitutional:      General: She is not in acute distress.    Appearance: Normal appearance.  HENT:     Head: Normocephalic and atraumatic.     Right Ear: External ear normal.     Left Ear: External ear normal.  Eyes:     General: No scleral icterus.       Right eye: No discharge.        Left eye: No discharge.     Conjunctiva/sclera: Conjunctivae normal.  Neck:     Thyroid: No thyromegaly.  Cardiovascular:     Rate and Rhythm: Normal rate and regular rhythm.  Pulmonary:     Effort: No respiratory distress.     Breath sounds: Normal breath sounds. No wheezing.  Abdominal:     General: Bowel sounds are normal.     Palpations: Abdomen is soft.     Tenderness: There is no abdominal tenderness.  Musculoskeletal:        General: No swelling or tenderness.     Cervical back: Neck supple. No tenderness.  Lymphadenopathy:     Cervical: No  cervical adenopathy.  Skin:    Findings: No erythema or rash.  Neurological:     Mental Status: She is alert.  Psychiatric:        Mood and Affect: Mood normal.        Behavior: Behavior normal.     BP 118/78   Pulse 78   Temp (!) 97.3 F (36.3 C)   Resp 16   Ht 5\' 3"  (  1.6 m)   Wt 209 lb (94.8 kg)   SpO2 98%   BMI 37.02 kg/m  Wt Readings from Last 3 Encounters:  12/18/19 209 lb (94.8 kg)  11/08/19 213 lb (96.6 kg)  09/10/19 213 lb (96.6 kg)     Lab Results  Component Value Date   WBC 6.7 12/14/2019   HGB 13.5 12/14/2019   HCT 40.1 12/14/2019   PLT 363.0 12/14/2019   GLUCOSE 81 12/14/2019   CHOL 139 12/14/2019   TRIG 110.0 12/14/2019   HDL 41.20 12/14/2019   LDLDIRECT 87.0 04/03/2019   LDLCALC 76 12/14/2019   ALT 13 12/14/2019   AST 15 12/14/2019   NA 137 12/14/2019   K 4.5 12/14/2019   CL 106 12/14/2019   CREATININE 0.87 12/14/2019   BUN 14 12/14/2019   CO2 26 12/14/2019   TSH 1.50 04/03/2019    MM 3D SCREEN BREAST W/IMPLANT BILATERAL  Result Date: 08/02/2019 CLINICAL DATA:  Screening. EXAM: DIGITAL SCREENING BILATERAL MAMMOGRAM WITH IMPLANTS, CAD AND TOMO The patient has retroglandular implants. Standard and implant displaced views were performed. COMPARISON:  Previous exam(s). ACR Breast Density Category b: There are scattered areas of fibroglandular density. FINDINGS: There are no findings suspicious for malignancy. Images were processed with CAD. IMPRESSION: No mammographic evidence of malignancy. A result letter of this screening mammogram will be mailed directly to the patient. RECOMMENDATION: Screening mammogram in one year. (Code:SM-B-01Y) BI-RADS CATEGORY  1:  Negative. Electronically Signed   By: Claudie Revering M.D.   On: 08/02/2019 10:59       Assessment & Plan:   Problem List Items Addressed This Visit    Anemia    History of iron deficiency.  Guidelines changed - recommended colonoscopy age 66.        Chronic inflammatory arthritis    On  plaquenil.  Taking in the am now.  Tolerating.  Doing better.  Follow.        Difficulty sleeping    Sleeping better on current regimen.  Taking melatonin and gabapentin.  Follow.        Hypercholesterolemia    On crestor.  Low cholesterol diet and exercise.  Follow lipid panel and liver function tests.        Relevant Orders   Hepatic function panel   Lipid panel   Basic metabolic panel   Knee pain    Left knee pain - persistent.  Refer to ortho for evaluation.        Relevant Orders   Ambulatory referral to Orthopedic Surgery   Stress    Appears to be handling stress relatively well.  Doing well on current regimen.  Follow.        Thrombocytosis (HCC)    Platelet count wnl on last check.  Follow.         Other Visit Diagnoses    B12 deficiency       Relevant Medications   cyanocobalamin ((VITAMIN B-12)) injection 1,000 mcg (Completed)       Einar Pheasant, MD

## 2019-12-20 ENCOUNTER — Ambulatory Visit: Payer: 59

## 2019-12-29 ENCOUNTER — Encounter: Payer: Self-pay | Admitting: Internal Medicine

## 2019-12-29 NOTE — Assessment & Plan Note (Signed)
Appears to be handling stress relatively well.  Doing well on current regimen.  Follow.

## 2019-12-29 NOTE — Assessment & Plan Note (Signed)
Sleeping better on current regimen.  Taking melatonin and gabapentin.  Follow.

## 2019-12-29 NOTE — Assessment & Plan Note (Signed)
Platelet count wnl on last check.  Follow.

## 2019-12-29 NOTE — Assessment & Plan Note (Signed)
History of iron deficiency.  Guidelines changed - recommended colonoscopy age 49.

## 2019-12-29 NOTE — Assessment & Plan Note (Signed)
Left knee pain - persistent.  Refer to ortho for evaluation.

## 2019-12-29 NOTE — Assessment & Plan Note (Signed)
On plaquenil.  Taking in the am now.  Tolerating.  Doing better.  Follow.

## 2019-12-29 NOTE — Assessment & Plan Note (Signed)
On crestor.  Low cholesterol diet and exercise.  Follow lipid panel and liver function tests.   

## 2020-01-01 ENCOUNTER — Ambulatory Visit: Payer: 59 | Attending: Internal Medicine

## 2020-01-01 DIAGNOSIS — Z23 Encounter for immunization: Secondary | ICD-10-CM

## 2020-01-01 NOTE — Progress Notes (Signed)
   Covid-19 Vaccination Clinic  Name:  KARMEN KLEIN    MRN: [id number] DOB: [birth year]  01/01/2020  Ms. Kutch was observed post Covid-19 immunization for 15 minutes without incident. She was provided with Vaccine Information Sheet and instruction to access the V-Safe system.   Ms. Mada was instructed to call 911 with any severe reactions post vaccine: Marland Kitchen Difficulty breathing  . Swelling of face and throat  . A fast heartbeat  . A bad rash all over body  . Dizziness and weakness   Immunizations Administered    Name Date Dose VIS Date Route   Pfizer COVID-19 Vaccine 01/01/2020  2:26 PM 0.3 mL 08/31/2019 Intramuscular   Manufacturer: [place]   Lot: [id number]   [place]: [id number]

## 2020-01-22 ENCOUNTER — Other Ambulatory Visit: Payer: Self-pay | Admitting: Internal Medicine

## 2020-01-30 ENCOUNTER — Encounter: Payer: Self-pay | Admitting: Internal Medicine

## 2020-01-30 NOTE — Telephone Encounter (Signed)
Left detailed message for patient.

## 2020-01-30 NOTE — Telephone Encounter (Signed)
Are you okay with her using oral b12 1,000 mcg until she comes in for her shot?

## 2020-01-30 NOTE — Telephone Encounter (Signed)
I am ok if she takes the oral B12 1019mcg q day if this is the only option.  There does not look like there is a full nurse schedule today.  Can she come today or is there now way to get her in before last week of May.  If not, then oral B12 as outlined above.

## 2020-01-30 NOTE — Telephone Encounter (Signed)
LMTCB

## 2020-01-31 ENCOUNTER — Other Ambulatory Visit: Payer: Self-pay

## 2020-01-31 ENCOUNTER — Ambulatory Visit (INDEPENDENT_AMBULATORY_CARE_PROVIDER_SITE_OTHER): Payer: 59

## 2020-01-31 DIAGNOSIS — E538 Deficiency of other specified B group vitamins: Secondary | ICD-10-CM

## 2020-01-31 MED ORDER — CYANOCOBALAMIN 1000 MCG/ML IJ SOLN
1000.0000 ug | Freq: Once | INTRAMUSCULAR | Status: AC
  Administered 2020-01-31: 1000 ug via INTRAMUSCULAR

## 2020-01-31 NOTE — Progress Notes (Addendum)
Pt came in for monthly b12 injection. Administer by Tally Joe, CMA. Given in Left Deltoid. Tolerated well. No complaints or concerns at this time.   Reviewed.  Dr Nicki Reaper

## 2020-02-11 DIAGNOSIS — M1712 Unilateral primary osteoarthritis, left knee: Secondary | ICD-10-CM | POA: Diagnosis not present

## 2020-02-11 DIAGNOSIS — M1711 Unilateral primary osteoarthritis, right knee: Secondary | ICD-10-CM | POA: Diagnosis not present

## 2020-02-19 ENCOUNTER — Ambulatory Visit: Payer: 59

## 2020-02-25 ENCOUNTER — Other Ambulatory Visit: Payer: Self-pay | Admitting: Internal Medicine

## 2020-02-25 NOTE — Telephone Encounter (Signed)
Refill request for xanax, last seen 12-18-19, last filled 10-27-19.  Please advise.

## 2020-02-25 NOTE — Telephone Encounter (Signed)
rx sent in for xanax #30 with no refills.   

## 2020-02-28 ENCOUNTER — Other Ambulatory Visit: Payer: Self-pay

## 2020-02-28 ENCOUNTER — Ambulatory Visit (INDEPENDENT_AMBULATORY_CARE_PROVIDER_SITE_OTHER): Payer: 59

## 2020-02-28 DIAGNOSIS — E538 Deficiency of other specified B group vitamins: Secondary | ICD-10-CM

## 2020-02-28 MED ORDER — CYANOCOBALAMIN 1000 MCG/ML IJ SOLN
1000.0000 ug | Freq: Once | INTRAMUSCULAR | Status: AC
  Administered 2020-02-28: 1000 ug via INTRAMUSCULAR

## 2020-02-28 NOTE — Progress Notes (Addendum)
Patient presented for B 12 injection to left deltoid, patient voiced no concerns nor showed any signs of distress during injection.  Reviewed.  Dr Scott 

## 2020-03-18 ENCOUNTER — Other Ambulatory Visit: Payer: Self-pay

## 2020-03-18 ENCOUNTER — Other Ambulatory Visit (INDEPENDENT_AMBULATORY_CARE_PROVIDER_SITE_OTHER): Payer: 59

## 2020-03-18 DIAGNOSIS — E7800 Pure hypercholesterolemia, unspecified: Secondary | ICD-10-CM | POA: Diagnosis not present

## 2020-03-18 LAB — HEPATIC FUNCTION PANEL
ALT: 11 U/L (ref 0–35)
AST: 12 U/L (ref 0–37)
Albumin: 3.8 g/dL (ref 3.5–5.2)
Alkaline Phosphatase: 69 U/L (ref 39–117)
Bilirubin, Direct: 0.1 mg/dL (ref 0.0–0.3)
Total Bilirubin: 0.3 mg/dL (ref 0.2–1.2)
Total Protein: 6 g/dL (ref 6.0–8.3)

## 2020-03-18 LAB — BASIC METABOLIC PANEL
BUN: 11 mg/dL (ref 6–23)
CO2: 25 mEq/L (ref 19–32)
Calcium: 8.7 mg/dL (ref 8.4–10.5)
Chloride: 105 mEq/L (ref 96–112)
Creatinine, Ser: 0.77 mg/dL (ref 0.40–1.20)
GFR: 79.66 mL/min (ref >60.00)
Glucose, Bld: 85 mg/dL (ref 70–99)
Potassium: 4.3 mEq/L (ref 3.5–5.1)
Sodium: 138 mEq/L (ref 135–145)

## 2020-03-18 LAB — LIPID PANEL
Cholesterol: 137 mg/dL (ref 0–200)
HDL: 43.9 mg/dL (ref >39.00)
LDL Cholesterol: 68 mg/dL (ref 0–99)
NonHDL: 93.24
Total CHOL/HDL Ratio: 3
Triglycerides: 126 mg/dL (ref 0.0–149.0)
VLDL: 25.2 mg/dL (ref 0.0–40.0)

## 2020-03-19 ENCOUNTER — Other Ambulatory Visit: Payer: Self-pay | Admitting: Internal Medicine

## 2020-03-20 ENCOUNTER — Other Ambulatory Visit: Payer: Self-pay

## 2020-03-20 ENCOUNTER — Ambulatory Visit (INDEPENDENT_AMBULATORY_CARE_PROVIDER_SITE_OTHER): Payer: 59 | Admitting: Internal Medicine

## 2020-03-20 VITALS — BP 130/84 | HR 88 | Temp 98.3°F | Resp 16 | Ht 63.0 in | Wt 212.0 lb

## 2020-03-20 DIAGNOSIS — G479 Sleep disorder, unspecified: Secondary | ICD-10-CM | POA: Diagnosis not present

## 2020-03-20 DIAGNOSIS — E538 Deficiency of other specified B group vitamins: Secondary | ICD-10-CM

## 2020-03-20 DIAGNOSIS — E7800 Pure hypercholesterolemia, unspecified: Secondary | ICD-10-CM | POA: Diagnosis not present

## 2020-03-20 DIAGNOSIS — D473 Essential (hemorrhagic) thrombocythemia: Secondary | ICD-10-CM

## 2020-03-20 DIAGNOSIS — M25569 Pain in unspecified knee: Secondary | ICD-10-CM | POA: Diagnosis not present

## 2020-03-20 DIAGNOSIS — D649 Anemia, unspecified: Secondary | ICD-10-CM | POA: Diagnosis not present

## 2020-03-20 DIAGNOSIS — D75839 Thrombocytosis, unspecified: Secondary | ICD-10-CM

## 2020-03-20 DIAGNOSIS — M1990 Unspecified osteoarthritis, unspecified site: Secondary | ICD-10-CM | POA: Diagnosis not present

## 2020-03-20 DIAGNOSIS — F439 Reaction to severe stress, unspecified: Secondary | ICD-10-CM

## 2020-03-20 MED ORDER — CYANOCOBALAMIN 1000 MCG/ML IJ SOLN
1000.0000 ug | Freq: Once | INTRAMUSCULAR | Status: AC
  Administered 2020-03-20: 1000 ug via INTRAMUSCULAR

## 2020-03-20 MED ORDER — GABAPENTIN 300 MG PO CAPS: ORAL_CAPSULE | ORAL | 2 refills | Status: DC

## 2020-03-20 NOTE — Progress Notes (Signed)
[name], female   DOB: [birth year], 49 y.o.   MRN: [id number]   Subjective:    [name], female    DOB: [birth year], 49 y.o.   MRN: [id number]  HPI This visit occurred during the SARS-CoV-2 public health emergency.  Safety protocols were in place, including screening questions prior to the visit, additional usage of staff PPE, and extensive cleaning of exam room while observing appropriate contact time as indicated for disinfecting solutions.  Patient here for a scheduled follow up.  She reports she is doing relatively well.  Has been having increased problems with her knees.  Planning to f/u with Dr Roland Rack in the future.  Using a cane.  Taking gabapentin 300mg  - around 6:00.  Does feel helps her sleep, but feels needs to increase the dose.  Tolerating the medication.  No chest pain or sob reported.  No abdominal pain or bowel change reported.  Handling stress.     Past Medical History:  Diagnosis Date  . Allergy   . Blood in stool    H/O  . History of chicken pox   . Hx: UTI (urinary tract infection)   . Hyperlipidemia    Past Surgical History:  Procedure Laterality Date  . AUGMENTATION MAMMAPLASTY  2001  . CESAREAN SECTION  2007 and 2009  . tummy tuck  2001   Family History  Problem Relation Age of Onset  . Healthy Mother   . Hypertension Mother   . Cancer Father        non hogkins lymphoma  . Atrial fibrillation Father   . Heart disease Father   . Hypertension Father   . Arthritis Sister   . Asthma Sister   . Hypertension Sister   . Healthy Daughter   . Healthy Son   . Alzheimer's disease Maternal Grandmother   . Healthy Sister   . Arthritis Maternal Grandfather   . Colon cancer Paternal Uncle 11  . Breast cancer Neg Hx    Social History   Socioeconomic History  . Marital status: Married    Spouse name: Not on file  . Number of children: Not on file  . Years of education: Not on file  . Highest education level: Not on file    Occupational History  . Not on file  Tobacco Use  . Smoking status: Never Smoker  . Smokeless tobacco: Never Used  Substance and Sexual Activity  . Alcohol use: Yes    Alcohol/week: 0.0 standard drinks    Comment: occasional  . Drug use: No  . Sexual activity: Not on file  Other Topics Concern  . Not on file  Social History Narrative  . Not on file   Social Determinants of Health   Financial Resource Strain:   . Difficulty of Paying Living Expenses:   Food Insecurity:   . Worried About Charity fundraiser in the Last Year:   . Arboriculturist in the Last Year:   Transportation Needs:   . Film/video editor (Medical):   Marland Kitchen Lack of Transportation (Non-Medical):   Physical Activity:   . Days of Exercise per Week:   . Minutes of Exercise per Session:   Stress:   . Feeling of Stress :   Social Connections:   . Frequency of Communication with Friends and Family:   . Frequency of Social Gatherings with Friends and Family:   . Attends Religious Services:   . Active Member of Clubs or  Organizations:   . Attends Archivist Meetings:   Marland Kitchen Marital Status:     Outpatient Encounter Medications as of 03/20/2020  Medication Sig  . acetaminophen (TYLENOL) 500 MG tablet Take 500 mg by mouth every 6 (six) hours as needed.  . ALPRAZolam (XANAX) 0.25 MG tablet TAKE 1 TABLET BY MOUTH ONCE DAILY AS NEEDED FOR ANXIETY  . cetirizine (ZYRTEC) 10 MG tablet Take 10 mg by mouth daily.  . ferrous sulfate 325 (65 FE) MG tablet Take 325 mg by mouth daily with breakfast.  . gabapentin (NEURONTIN) 300 MG capsule Take 2 capsules q pm  . glucosamine-chondroitin 500-400 MG tablet Take 1 tablet by mouth 3 (three) times daily.  . hydroxychloroquine (PLAQUENIL) 200 MG tablet One tab twice a day for inflammatory arthritis, 30 days, 60 tabs  . meloxicam (MOBIC) 7.5 MG tablet Take 1 tablet (7.5 mg total) by mouth daily.  . rosuvastatin (CRESTOR) 10 MG tablet TAKE 1 TABLET BY MOUTH DAILY.  .  [DISCONTINUED] buPROPion (WELLBUTRIN SR) 150 MG 12 hr tablet TAKE 1 TABLET BY MOUTH TWICE DAILY  . [DISCONTINUED] fluticasone (FLONASE) 50 MCG/ACT nasal spray USE 2 SPRAYS IN EACH NOSTRIL ONCE DAILY  . [DISCONTINUED] gabapentin (NEURONTIN) 300 MG capsule TAKE 1 CAPSULE BY MOUTH EVERY NIGHT AT BEDTIME  . [EXPIRED] cyanocobalamin ((VITAMIN B-12)) injection 1,000 mcg    No facility-administered encounter medications on file as of 03/20/2020.    Review of Systems  Constitutional: Negative for appetite change and unexpected weight change.  HENT: Negative for congestion and sinus pressure.   Respiratory: Negative for cough, chest tightness and shortness of breath.   Cardiovascular: Negative for chest pain, palpitations and leg swelling.  Gastrointestinal: Negative for abdominal pain, diarrhea, nausea and vomiting.  Genitourinary: Negative for difficulty urinating and dysuria.  Musculoskeletal: Negative for joint swelling and myalgias.  Skin: Negative for color change and rash.  Neurological: Negative for dizziness, light-headedness and headaches.  Psychiatric/Behavioral: Negative for agitation and dysphoric mood.       Objective:    Physical Exam Vitals reviewed.  Constitutional:      General: She is not in acute distress.    Appearance: Normal appearance.  HENT:     Head: Normocephalic and atraumatic.     Right Ear: External ear normal.     Left Ear: External ear normal.  Eyes:     General: No scleral icterus.       Right eye: No discharge.        Left eye: No discharge.     Conjunctiva/sclera: Conjunctivae normal.  Neck:     Thyroid: No thyromegaly.  Cardiovascular:     Rate and Rhythm: Normal rate and regular rhythm.  Pulmonary:     Effort: No respiratory distress.     Breath sounds: Normal breath sounds. No wheezing.  Abdominal:     General: Bowel sounds are normal.     Palpations: Abdomen is soft.     Tenderness: There is no abdominal tenderness.  Musculoskeletal:         General: No swelling or tenderness.     Cervical back: Neck supple. No tenderness.  Lymphadenopathy:     Cervical: No cervical adenopathy.  Skin:    Findings: No erythema or rash.  Neurological:     Mental Status: She is alert.  Psychiatric:        Mood and Affect: Mood normal.        Behavior: Behavior normal.     BP 130/84  Pulse 88   Temp 98.3 F (36.8 C)   Resp 16   Ht 5\' 3"  (1.6 m)   Wt 212 lb (96.2 kg)   SpO2 98%   BMI 37.55 kg/m  Wt Readings from Last 3 Encounters:  03/20/20 212 lb (96.2 kg)  12/18/19 209 lb (94.8 kg)  11/08/19 213 lb (96.6 kg)     Lab Results  Component Value Date   WBC 6.7 12/14/2019   HGB 13.5 12/14/2019   HCT 40.1 12/14/2019   PLT 363.0 12/14/2019   GLUCOSE 85 03/18/2020   CHOL 137 03/18/2020   TRIG 126.0 03/18/2020   HDL 43.90 03/18/2020   LDLDIRECT 87.0 04/03/2019   LDLCALC 68 03/18/2020   ALT 11 03/18/2020   AST 12 03/18/2020   NA 138 03/18/2020   K 4.3 03/18/2020   CL 105 03/18/2020   CREATININE 0.77 03/18/2020   BUN 11 03/18/2020   CO2 25 03/18/2020   TSH 1.50 04/03/2019    MM 3D SCREEN BREAST W/IMPLANT BILATERAL  Result Date: 08/02/2019 CLINICAL DATA:  Screening. EXAM: DIGITAL SCREENING BILATERAL MAMMOGRAM WITH IMPLANTS, CAD AND TOMO The patient has retroglandular implants. Standard and implant displaced views were performed. COMPARISON:  Previous exam(s). ACR Breast Density Category b: There are scattered areas of fibroglandular density. FINDINGS: There are no findings suspicious for malignancy. Images were processed with CAD. IMPRESSION: No mammographic evidence of malignancy. A result letter of this screening mammogram will be mailed directly to the patient. RECOMMENDATION: Screening mammogram in one year. (Code:SM-B-01Y) BI-RADS CATEGORY  1:  Negative. Electronically Signed   By: Claudie Revering M.D.   On: 08/02/2019 10:59       Assessment & Plan:   Problem List Items Addressed This Visit    Anemia    History of  iron deficiency.  Check cbc, iron studies and B12.        Relevant Orders   CBC with Differential/Platelet   Vitamin B12   IBC + Ferritin   Chronic inflammatory arthritis    On plaquenil.  Followed by rheumatology.       Difficulty sleeping    Is doing better on gabapentin.  Feels needs to increase the dose.  Will increase to 600mg  q pm.  Follow.  Discussed possible side effects.        Hypercholesterolemia    On crestor.  Low cholesterol diet and exercise.  Follow lipid panel and liver function tests.        Relevant Orders   Comprehensive metabolic panel   TSH   Lipid panel   Knee pain    Persistent problems with her knees. Sees rheumatology.  Plans to f/u with ortho (Dr Roland Rack) - in the next several months.        Stress    Overall appears to be handling things relatively well.  Follow.        Thrombocytosis ([place])    Follow cbc.        Other Visit Diagnoses    B12 deficiency    -  Primary   Relevant Medications   cyanocobalamin ((VITAMIN B-12)) injection 1,000 mcg (Completed)       Einar Pheasant, MD

## 2020-03-24 ENCOUNTER — Other Ambulatory Visit: Payer: Self-pay | Admitting: Internal Medicine

## 2020-03-27 ENCOUNTER — Ambulatory Visit: Payer: 59

## 2020-03-30 ENCOUNTER — Encounter: Payer: Self-pay | Admitting: Internal Medicine

## 2020-03-30 NOTE — Assessment & Plan Note (Signed)
Persistent problems with her knees. Sees rheumatology.  Plans to f/u with ortho (Dr Roland Rack) - in the next several months.

## 2020-03-30 NOTE — Assessment & Plan Note (Signed)
Overall appears to be handling things relatively well.  Follow.   

## 2020-03-30 NOTE — Assessment & Plan Note (Signed)
On plaquenil.  Followed by rheumatology.

## 2020-03-30 NOTE — Assessment & Plan Note (Signed)
On crestor.  Low cholesterol diet and exercise.  Follow lipid panel and liver function tests.   

## 2020-03-30 NOTE — Assessment & Plan Note (Signed)
Follow cbc.  

## 2020-03-30 NOTE — Assessment & Plan Note (Signed)
Is doing better on gabapentin.  Feels needs to increase the dose.  Will increase to 600mg  q pm.  Follow.  Discussed possible side effects.

## 2020-03-30 NOTE — Assessment & Plan Note (Signed)
History of iron deficiency.  Check cbc, iron studies and B12.

## 2020-04-24 ENCOUNTER — Other Ambulatory Visit: Payer: Self-pay

## 2020-04-24 ENCOUNTER — Ambulatory Visit (INDEPENDENT_AMBULATORY_CARE_PROVIDER_SITE_OTHER): Payer: 59

## 2020-04-24 DIAGNOSIS — E538 Deficiency of other specified B group vitamins: Secondary | ICD-10-CM

## 2020-04-24 MED ORDER — CYANOCOBALAMIN 1000 MCG/ML IJ SOLN
1000.0000 ug | Freq: Once | INTRAMUSCULAR | Status: AC
  Administered 2020-04-24: 1000 ug via INTRAMUSCULAR

## 2020-04-24 NOTE — Progress Notes (Addendum)
Patient presented for B 12 injection to left deltoid, patient voiced no concerns nor showed any signs of distress during injection.  Reviewed.  Dr Scott 

## 2020-05-06 ENCOUNTER — Encounter: Payer: Self-pay | Admitting: Internal Medicine

## 2020-05-07 NOTE — Telephone Encounter (Signed)
Tamara Shah, this pt sent a my chart message regarding the third shot.  She has chronic inflammatory arthritis.  She is on plaquenil.  You had said send you individual patients.  Thank you for your helpl.

## 2020-05-08 NOTE — Telephone Encounter (Signed)
Please call pt and let her know.  See Catie's note.  Qualifies for third dose (covid vaccine)

## 2020-05-16 ENCOUNTER — Ambulatory Visit: Payer: 59 | Attending: Internal Medicine

## 2020-05-16 DIAGNOSIS — Z23 Encounter for immunization: Secondary | ICD-10-CM

## 2020-05-16 NOTE — Progress Notes (Signed)
   Covid-19 Vaccination Clinic  Name:  Tamara Shah    MRN: [id number] DOB: Feb 16, 1971  05/16/2020  [name] was observed post Covid-19 immunization for 15 minutes without incident. She was provided with Vaccine Information Sheet and instruction to access the V-Safe system.   [name] was instructed to call 911 with any severe reactions post vaccine: Marland Kitchen Difficulty breathing  . Swelling of face and throat  . A fast heartbeat  . A bad rash all over body  . Dizziness and weakness   Immunizations Administered    Name Date Dose VIS Date Route   Pfizer COVID-19 Vaccine 05/16/2020  9:56 AM 0.3 mL 11/14/2018 Intramuscular   Manufacturer: [place]   Lot: [id number]   [place]: [id number]

## 2020-05-19 ENCOUNTER — Other Ambulatory Visit: Payer: Self-pay | Admitting: Internal Medicine

## 2020-05-28 ENCOUNTER — Ambulatory Visit: Payer: 59

## 2020-05-29 ENCOUNTER — Ambulatory Visit (INDEPENDENT_AMBULATORY_CARE_PROVIDER_SITE_OTHER): Payer: 59

## 2020-05-29 ENCOUNTER — Other Ambulatory Visit: Payer: Self-pay

## 2020-05-29 DIAGNOSIS — E538 Deficiency of other specified B group vitamins: Secondary | ICD-10-CM

## 2020-05-29 MED ORDER — CYANOCOBALAMIN 1000 MCG/ML IJ SOLN
1000.0000 ug | Freq: Once | INTRAMUSCULAR | Status: AC
  Administered 2020-05-29: 1000 ug via INTRAMUSCULAR

## 2020-05-29 NOTE — Progress Notes (Addendum)
Patient presented for B 12 injection to left deltoid, patient voiced no concerns nor showed any signs of distress during injection.  Reviewed.  Dr Scott 

## 2020-06-03 ENCOUNTER — Other Ambulatory Visit: Payer: Self-pay | Admitting: Rheumatology

## 2020-06-03 DIAGNOSIS — M8949 Other hypertrophic osteoarthropathy, multiple sites: Secondary | ICD-10-CM | POA: Diagnosis not present

## 2020-06-03 DIAGNOSIS — R768 Other specified abnormal immunological findings in serum: Secondary | ICD-10-CM | POA: Diagnosis not present

## 2020-06-03 DIAGNOSIS — M064 Inflammatory polyarthropathy: Secondary | ICD-10-CM | POA: Diagnosis not present

## 2020-06-03 DIAGNOSIS — D8989 Other specified disorders involving the immune mechanism, not elsewhere classified: Secondary | ICD-10-CM | POA: Diagnosis not present

## 2020-06-03 DIAGNOSIS — Z79899 Other long term (current) drug therapy: Secondary | ICD-10-CM | POA: Diagnosis not present

## 2020-06-05 DIAGNOSIS — Z135 Encounter for screening for eye and ear disorders: Secondary | ICD-10-CM | POA: Diagnosis not present

## 2020-06-05 DIAGNOSIS — H5213 Myopia, bilateral: Secondary | ICD-10-CM | POA: Diagnosis not present

## 2020-06-05 DIAGNOSIS — Z79899 Other long term (current) drug therapy: Secondary | ICD-10-CM | POA: Diagnosis not present

## 2020-06-07 DIAGNOSIS — Z20822 Contact with and (suspected) exposure to covid-19: Secondary | ICD-10-CM | POA: Diagnosis not present

## 2020-07-01 ENCOUNTER — Other Ambulatory Visit (INDEPENDENT_AMBULATORY_CARE_PROVIDER_SITE_OTHER): Payer: 59

## 2020-07-01 ENCOUNTER — Other Ambulatory Visit: Payer: Self-pay

## 2020-07-01 ENCOUNTER — Ambulatory Visit (INDEPENDENT_AMBULATORY_CARE_PROVIDER_SITE_OTHER): Payer: 59

## 2020-07-01 DIAGNOSIS — E7800 Pure hypercholesterolemia, unspecified: Secondary | ICD-10-CM

## 2020-07-01 DIAGNOSIS — E538 Deficiency of other specified B group vitamins: Secondary | ICD-10-CM | POA: Diagnosis not present

## 2020-07-01 DIAGNOSIS — Z23 Encounter for immunization: Secondary | ICD-10-CM | POA: Diagnosis not present

## 2020-07-01 DIAGNOSIS — D649 Anemia, unspecified: Secondary | ICD-10-CM

## 2020-07-01 LAB — COMPREHENSIVE METABOLIC PANEL
ALT: 10 U/L (ref 0–35)
AST: 12 U/L (ref 0–37)
Albumin: 4 g/dL (ref 3.5–5.2)
Alkaline Phosphatase: 62 U/L (ref 39–117)
BUN: 12 mg/dL (ref 6–23)
CO2: 29 mEq/L (ref 19–32)
Calcium: 8.8 mg/dL (ref 8.4–10.5)
Chloride: 105 mEq/L (ref 96–112)
Creatinine, Ser: 0.83 mg/dL (ref 0.40–1.20)
GFR: 82.61 mL/min (ref >60.00)
Glucose, Bld: 82 mg/dL (ref 70–99)
Potassium: 4 mEq/L (ref 3.5–5.1)
Sodium: 139 mEq/L (ref 135–145)
Total Bilirubin: 0.5 mg/dL (ref 0.2–1.2)
Total Protein: 6.5 g/dL (ref 6.0–8.3)

## 2020-07-01 LAB — CBC WITH DIFFERENTIAL/PLATELET
Basophils Absolute: 0 10*3/uL (ref 0.0–0.1)
Basophils Relative: 0.5 % (ref 0.0–3.0)
Eosinophils Absolute: 0.3 10*3/uL (ref 0.0–0.7)
Eosinophils Relative: 4.8 % (ref 0.0–5.0)
HCT: 41 % (ref 36.0–46.0)
Hemoglobin: 13.7 g/dL (ref 12.0–15.0)
Lymphocytes Relative: 22.9 % (ref 12.0–46.0)
Lymphs Abs: 1.4 10*3/uL (ref 0.7–4.0)
MCHC: 33.4 g/dL (ref 30.0–36.0)
MCV: 88.5 fl (ref 78.0–100.0)
Monocytes Absolute: 0.4 10*3/uL (ref 0.1–1.0)
Monocytes Relative: 6.6 % (ref 3.0–12.0)
Neutro Abs: 4 10*3/uL (ref 1.4–7.7)
Neutrophils Relative %: 65.2 % (ref 43.0–77.0)
Platelets: 308 10*3/uL (ref 150.0–400.0)
RBC: 4.63 Mil/uL (ref 3.87–5.11)
RDW: 13.1 % (ref 11.5–15.5)
WBC: 6.1 10*3/uL (ref 4.0–10.5)

## 2020-07-01 LAB — IBC + FERRITIN
Ferritin: 35.7 ng/mL (ref 10.0–291.0)
Iron: 57 ug/dL (ref 42–145)
Saturation Ratios: 18.7 % — ABNORMAL LOW (ref 20.0–50.0)
Transferrin: 218 mg/dL (ref 212.0–360.0)

## 2020-07-01 LAB — VITAMIN B12: Vitamin B-12: 403 pg/mL (ref 211–911)

## 2020-07-01 LAB — TSH: TSH: 1.8 u[IU]/mL (ref 0.35–4.50)

## 2020-07-01 LAB — LIPID PANEL
Cholesterol: 134 mg/dL (ref 0–200)
HDL: 49.5 mg/dL (ref >39.00)
LDL Cholesterol: 65 mg/dL (ref 0–99)
NonHDL: 84.54
Total CHOL/HDL Ratio: 3
Triglycerides: 98 mg/dL (ref 0.0–149.0)
VLDL: 19.6 mg/dL (ref 0.0–40.0)

## 2020-07-01 MED ORDER — CYANOCOBALAMIN 1000 MCG/ML IJ SOLN
1000.0000 ug | Freq: Once | INTRAMUSCULAR | Status: AC
  Administered 2020-07-01: 1000 ug via INTRAMUSCULAR

## 2020-07-01 NOTE — Progress Notes (Addendum)
Patient presented for B 12 injection to left deltoid, patient voiced no concerns nor showed any signs of distress during injection.  Reviewed.  Dr Scott 

## 2020-07-03 ENCOUNTER — Ambulatory Visit (INDEPENDENT_AMBULATORY_CARE_PROVIDER_SITE_OTHER): Payer: 59 | Admitting: Internal Medicine

## 2020-07-03 ENCOUNTER — Other Ambulatory Visit: Payer: Self-pay

## 2020-07-03 ENCOUNTER — Encounter: Payer: Self-pay | Admitting: Internal Medicine

## 2020-07-03 DIAGNOSIS — F439 Reaction to severe stress, unspecified: Secondary | ICD-10-CM | POA: Diagnosis not present

## 2020-07-03 DIAGNOSIS — M1990 Unspecified osteoarthritis, unspecified site: Secondary | ICD-10-CM | POA: Diagnosis not present

## 2020-07-03 DIAGNOSIS — E7800 Pure hypercholesterolemia, unspecified: Secondary | ICD-10-CM | POA: Diagnosis not present

## 2020-07-03 DIAGNOSIS — D649 Anemia, unspecified: Secondary | ICD-10-CM

## 2020-07-03 DIAGNOSIS — D75839 Thrombocytosis, unspecified: Secondary | ICD-10-CM

## 2020-07-03 NOTE — Progress Notes (Signed)
[name], female   DOB: 1970/12/18, 49 y.o.   MRN: [id number]   Subjective:    [name], female    DOB: 01-Dec-1970, 49 y.o.   MRN: [id number]  HPI This visit occurred during the SARS-CoV-2 public health emergency.  Safety protocols were in place, including screening questions prior to the visit, additional usage of staff PPE, and extensive cleaning of exam room while observing appropriate contact time as indicated for disinfecting solutions.  Patient here for a scheduled follow up.  She reports increased stress with dealing her pain.  On wellbutrin.  Feels this works well for her.  Has good support.  Overall she feels she is handling things well.  No chest pain or sob reported.  No abdominal pain or bowel change reported.  Seeing rheumatology for f/u inflammatory arthritis.  Started on MTX.  Also on plaquenil.  Has been on MTX for 4-5 weeks.  Cannot tell a big difference yet.  Is having some cramping pain in her stomach after she takes the medication. Some acid reflux. Taking pepcid.  She is eating.  Has f/u soon with rheumatology.     Past Medical History:  Diagnosis Date  . Allergy   . Blood in stool    H/O  . History of chicken pox   . Hx: UTI (urinary tract infection)   . Hyperlipidemia    Past Surgical History:  Procedure Laterality Date  . AUGMENTATION MAMMAPLASTY  2001  . CESAREAN SECTION  2007 and 2009  . tummy tuck  2001   Family History  Problem Relation Age of Onset  . Healthy Mother   . Hypertension Mother   . Cancer Father        non hogkins lymphoma  . Atrial fibrillation Father   . Heart disease Father   . Hypertension Father   . Arthritis Sister   . Asthma Sister   . Hypertension Sister   . Healthy Daughter   . Healthy Son   . Alzheimer's disease Maternal Grandmother   . Healthy Sister   . Arthritis Maternal Grandfather   . Colon cancer Paternal Uncle 44  . Breast cancer Neg Hx    Social History   Socioeconomic  History  . Marital status: Married    Spouse name: Not on file  . Number of children: Not on file  . Years of education: Not on file  . Highest education level: Not on file  Occupational History  . Not on file  Tobacco Use  . Smoking status: Never Smoker  . Smokeless tobacco: Never Used  Substance and Sexual Activity  . Alcohol use: Yes    Alcohol/week: 0.0 standard drinks    Comment: occasional  . Drug use: No  . Sexual activity: Not on file  Other Topics Concern  . Not on file  Social History Narrative  . Not on file   Social Determinants of Health   Financial Resource Strain:   . Difficulty of Paying Living Expenses: Not on file  Food Insecurity:   . Worried About Charity fundraiser in the Last Year: Not on file  . Ran Out of Food in the Last Year: Not on file  Transportation Needs:   . Lack of Transportation (Medical): Not on file  . Lack of Transportation (Non-Medical): Not on file  Physical Activity:   . Days of Exercise per Week: Not on file  . Minutes of Exercise per Session: Not on file  Stress:   .  Feeling of Stress : Not on file  Social Connections:   . Frequency of Communication with Friends and Family: Not on file  . Frequency of Social Gatherings with Friends and Family: Not on file  . Attends Religious Services: Not on file  . Active Member of Clubs or Organizations: Not on file  . Attends Archivist Meetings: Not on file  . Marital Status: Not on file    Outpatient Encounter Medications as of 07/03/2020  Medication Sig  . acetaminophen (TYLENOL) 500 MG tablet Take 500 mg by mouth every 6 (six) hours as needed.  . ALPRAZolam (XANAX) 0.25 MG tablet TAKE 1 TABLET BY MOUTH ONCE DAILY AS NEEDED FOR ANXIETY  . buPROPion (WELLBUTRIN SR) 150 MG 12 hr tablet TAKE 1 TABLET BY MOUTH TWICE DAILY  . cetirizine (ZYRTEC) 10 MG tablet Take 10 mg by mouth daily.  . ferrous sulfate 325 (65 FE) MG tablet Take 325 mg by mouth daily with breakfast.  .  fluticasone (FLONASE) 50 MCG/ACT nasal spray USE 2 SPRAYS IN EACH NOSTRIL ONCE DAILY  . folic acid (FOLVITE) 1 MG tablet Take 1 mg by mouth daily.  Marland Kitchen gabapentin (NEURONTIN) 300 MG capsule Take 2 capsules q pm  . hydroxychloroquine (PLAQUENIL) 200 MG tablet One tab twice a day for inflammatory arthritis, 30 days, 60 tabs  . meloxicam (MOBIC) 7.5 MG tablet Take 1 tablet (7.5 mg total) by mouth daily.  . methotrexate (RHEUMATREX) 2.5 MG tablet Take 6 tablets by mouth every 7 (seven) days.  . rosuvastatin (CRESTOR) 10 MG tablet TAKE 1 TABLET BY MOUTH DAILY.  . [DISCONTINUED] glucosamine-chondroitin 500-400 MG tablet Take 1 tablet by mouth 3 (three) times daily. (Patient not taking: Reported on 07/03/2020)   No facility-administered encounter medications on file as of 07/03/2020.    Review of Systems  Constitutional: Negative for fever and unexpected weight change.  HENT: Negative for congestion and sinus pressure.   Respiratory: Negative for cough, chest tightness and shortness of breath.   Cardiovascular: Negative for chest pain, palpitations and leg swelling.  Gastrointestinal: Negative for vomiting.       Some cramping - stomach pain after taking MTX.  Some acid reflux.  Taking pepcid.   Genitourinary: Negative for difficulty urinating and dysuria.  Musculoskeletal: Negative for myalgias.       Joint stiffness and pain.    Skin: Negative for color change and rash.  Neurological: Negative for dizziness, light-headedness and headaches.  Psychiatric/Behavioral: Negative for agitation.       Increased stress with her pain and not being able to do all of the things she wants to do.        Objective:    Physical Exam Vitals reviewed.  Constitutional:      General: She is not in acute distress.    Appearance: Normal appearance.  HENT:     Head: Normocephalic and atraumatic.     Right Ear: External ear normal.     Left Ear: External ear normal.  Eyes:     General: No scleral icterus.        Right eye: No discharge.        Left eye: No discharge.     Conjunctiva/sclera: Conjunctivae normal.  Neck:     Thyroid: No thyromegaly.  Cardiovascular:     Rate and Rhythm: Normal rate and regular rhythm.  Pulmonary:     Effort: No respiratory distress.     Breath sounds: Normal breath sounds. No wheezing.  Abdominal:  General: Bowel sounds are normal.     Palpations: Abdomen is soft.     Tenderness: There is no abdominal tenderness.  Musculoskeletal:        General: No swelling or tenderness.     Cervical back: Neck supple. No tenderness.  Lymphadenopathy:     Cervical: No cervical adenopathy.  Skin:    Findings: No erythema or rash.  Neurological:     Mental Status: She is alert.  Psychiatric:        Mood and Affect: Mood normal.        Behavior: Behavior normal.     BP 122/72 (BP Location: Left Arm, Patient Position: Sitting, Cuff Size: Large)   Pulse 94   Temp 98.9 F (37.2 C)   Ht 5\' 3"  (1.6 m)   Wt 212 lb 3.2 oz (96.3 kg)   SpO2 98%   BMI 37.59 kg/m  Wt Readings from Last 3 Encounters:  07/03/20 212 lb 3.2 oz (96.3 kg)  03/20/20 212 lb (96.2 kg)  12/18/19 209 lb (94.8 kg)     Lab Results  Component Value Date   WBC 6.1 07/01/2020   HGB 13.7 07/01/2020   HCT 41.0 07/01/2020   PLT 308.0 07/01/2020   GLUCOSE 82 07/01/2020   CHOL 134 07/01/2020   TRIG 98.0 07/01/2020   HDL 49.50 07/01/2020   LDLDIRECT 87.0 04/03/2019   LDLCALC 65 07/01/2020   ALT 10 07/01/2020   AST 12 07/01/2020   NA 139 07/01/2020   K 4.0 07/01/2020   CL 105 07/01/2020   CREATININE 0.83 07/01/2020   BUN 12 07/01/2020   CO2 29 07/01/2020   TSH 1.80 07/01/2020    MM 3D SCREEN BREAST W/IMPLANT BILATERAL  Result Date: 08/02/2019 CLINICAL DATA:  Screening. EXAM: DIGITAL SCREENING BILATERAL MAMMOGRAM WITH IMPLANTS, CAD AND TOMO The patient has retroglandular implants. Standard and implant displaced views were performed. COMPARISON:  Previous exam(s). ACR Breast Density  Category b: There are scattered areas of fibroglandular density. FINDINGS: There are no findings suspicious for malignancy. Images were processed with CAD. IMPRESSION: No mammographic evidence of malignancy. A result letter of this screening mammogram will be mailed directly to the patient. RECOMMENDATION: Screening mammogram in one year. (Code:SM-B-01Y) BI-RADS CATEGORY  1:  Negative. Electronically Signed   By: Claudie Revering M.D.   On: 08/02/2019 10:59       Assessment & Plan:   Problem List Items Addressed This Visit    Thrombocytosis    Platelet count 07/01/20 - wnl.       Stress    Increased stress as outlined.  On wellbutrin. Has good support.  Does not feel needs any further intervention at this time.  Follow.       Hypercholesterolemia    On crestor.  Low cholesterol diet and exercise.  Follow lipid panel and liver function tests.   Lab Results  Component Value Date   CHOL 134 07/01/2020   HDL 49.50 07/01/2020   LDLCALC 65 07/01/2020   LDLDIRECT 87.0 04/03/2019   TRIG 98.0 07/01/2020   CHOLHDL 3 07/01/2020        Relevant Orders   Lipid panel   Chronic inflammatory arthritis    On plaquenil. Rheumatology recently started her on MTX.  Some stomach issues as outlined.  Taking pepcid.  Continue pepcid. Has f/u soon with rheumatology and plans to discuss.  Wants to continue for now.        Relevant Medications   methotrexate (RHEUMATREX) 2.5 MG tablet  Other Relevant Orders   CBC with Differential/Platelet   Hepatic function panel   Basic metabolic panel   Anemia    History of IDA. Follow cbc and iron studies.       Relevant Medications   folic acid (FOLVITE) 1 MG tablet       Einar Pheasant, MD

## 2020-07-07 ENCOUNTER — Encounter: Payer: Self-pay | Admitting: Internal Medicine

## 2020-07-07 NOTE — Assessment & Plan Note (Signed)
History of IDA.  Follow cbc and iron studies.   

## 2020-07-07 NOTE — Assessment & Plan Note (Signed)
Increased stress as outlined.  On wellbutrin. Has good support.  Does not feel needs any further intervention at this time.  Follow.

## 2020-07-07 NOTE — Assessment & Plan Note (Signed)
Platelet count 07/01/20 - wnl.

## 2020-07-07 NOTE — Assessment & Plan Note (Signed)
On plaquenil. Rheumatology recently started her on MTX.  Some stomach issues as outlined.  Taking pepcid.  Continue pepcid. Has f/u soon with rheumatology and plans to discuss.  Wants to continue for now.

## 2020-07-07 NOTE — Assessment & Plan Note (Signed)
On crestor.  Low cholesterol diet and exercise.  Follow lipid panel and liver function tests.   Lab Results  Component Value Date   CHOL 134 07/01/2020   HDL 49.50 07/01/2020   LDLCALC 65 07/01/2020   LDLDIRECT 87.0 04/03/2019   TRIG 98.0 07/01/2020   CHOLHDL 3 07/01/2020

## 2020-07-17 ENCOUNTER — Other Ambulatory Visit: Payer: Self-pay | Admitting: Internal Medicine

## 2020-07-18 ENCOUNTER — Other Ambulatory Visit: Payer: Self-pay | Admitting: Rheumatology

## 2020-07-18 DIAGNOSIS — M069 Rheumatoid arthritis, unspecified: Secondary | ICD-10-CM | POA: Diagnosis not present

## 2020-07-18 DIAGNOSIS — M8949 Other hypertrophic osteoarthropathy, multiple sites: Secondary | ICD-10-CM | POA: Diagnosis not present

## 2020-07-18 DIAGNOSIS — Z111 Encounter for screening for respiratory tuberculosis: Secondary | ICD-10-CM | POA: Diagnosis not present

## 2020-07-18 DIAGNOSIS — Z79899 Other long term (current) drug therapy: Secondary | ICD-10-CM | POA: Diagnosis not present

## 2020-07-18 DIAGNOSIS — M1811 Unilateral primary osteoarthritis of first carpometacarpal joint, right hand: Secondary | ICD-10-CM | POA: Diagnosis not present

## 2020-07-21 ENCOUNTER — Other Ambulatory Visit: Payer: Self-pay | Admitting: Internal Medicine

## 2020-07-21 ENCOUNTER — Other Ambulatory Visit: Payer: Self-pay | Admitting: Rheumatology

## 2020-07-23 ENCOUNTER — Encounter: Payer: Self-pay | Admitting: Internal Medicine

## 2020-07-23 ENCOUNTER — Other Ambulatory Visit: Payer: Self-pay | Admitting: Internal Medicine

## 2020-07-23 DIAGNOSIS — Z1231 Encounter for screening mammogram for malignant neoplasm of breast: Secondary | ICD-10-CM

## 2020-07-23 NOTE — Telephone Encounter (Signed)
Spoke with patient. Advised that Dr Nicki Reaper is not in the office this PM. She is going to go over to [facility] to be evaluated.

## 2020-07-24 DIAGNOSIS — M79671 Pain in right foot: Secondary | ICD-10-CM | POA: Diagnosis not present

## 2020-07-24 DIAGNOSIS — M89671 Osteopathy after poliomyelitis, right ankle and foot: Secondary | ICD-10-CM | POA: Diagnosis not present

## 2020-07-28 ENCOUNTER — Other Ambulatory Visit: Payer: Self-pay | Admitting: Internal Medicine

## 2020-07-29 ENCOUNTER — Other Ambulatory Visit: Payer: Self-pay | Admitting: Internal Medicine

## 2020-08-05 ENCOUNTER — Ambulatory Visit (INDEPENDENT_AMBULATORY_CARE_PROVIDER_SITE_OTHER): Payer: 59

## 2020-08-05 ENCOUNTER — Other Ambulatory Visit: Payer: Self-pay

## 2020-08-05 DIAGNOSIS — E538 Deficiency of other specified B group vitamins: Secondary | ICD-10-CM

## 2020-08-05 MED ORDER — CYANOCOBALAMIN 1000 MCG/ML IJ SOLN
1000.0000 ug | Freq: Once | INTRAMUSCULAR | Status: AC
  Administered 2020-08-05: 1000 ug via INTRAMUSCULAR

## 2020-08-05 NOTE — Progress Notes (Addendum)
Patient presented for B 12 injection to right deltoid, patient voiced no concerns nor showed any signs of distress during injection.  Reviewed.  Dr Scott 

## 2020-08-11 ENCOUNTER — Other Ambulatory Visit: Payer: Self-pay | Admitting: Rheumatology

## 2020-08-11 ENCOUNTER — Other Ambulatory Visit: Payer: Self-pay | Admitting: Internal Medicine

## 2020-08-20 DIAGNOSIS — M79671 Pain in right foot: Secondary | ICD-10-CM | POA: Diagnosis not present

## 2020-08-26 ENCOUNTER — Encounter: Payer: Self-pay | Admitting: Internal Medicine

## 2020-08-27 NOTE — Telephone Encounter (Signed)
Left a voicemail to call back and schedule for tomorrow at 3:30 if patient is agreeable.

## 2020-08-27 NOTE — Telephone Encounter (Signed)
Reviewed my chart.  Please call pt and see if she feels she needs earlier appt.  If so, I can see her before 10/07/20.  I can see her at 3:30 on 08/28/20.  Will need to add her to schedule.  If this day does not work, can work in next week.

## 2020-08-28 ENCOUNTER — Telehealth: Payer: Self-pay | Admitting: Internal Medicine

## 2020-08-28 ENCOUNTER — Other Ambulatory Visit: Payer: Self-pay

## 2020-08-28 ENCOUNTER — Ambulatory Visit
Admission: RE | Admit: 2020-08-28 | Discharge: 2020-08-28 | Disposition: A | Discharge status: Home / Self Care | Payer: 59 | Source: Ambulatory Visit | Attending: Internal Medicine | Admitting: Internal Medicine

## 2020-08-28 DIAGNOSIS — Z1231 Encounter for screening mammogram for malignant neoplasm of breast: Secondary | ICD-10-CM | POA: Diagnosis not present

## 2020-08-28 IMAGING — MG DIGITAL SCREENING BILATERAL MAMMOGRAM WITH IMPLANTS, CAD AND TOM
8 of 16 series · 8 of 32 positions shown · non-contrast
Comparison: Previous exam(s).

CLINICAL DATA: Screening.

EXAM:
DIGITAL SCREENING BILATERAL MAMMOGRAM WITH IMPLANTS, CAD AND TOMO
The patient has implants. Standard and implant displaced views were
performed.

[L CC (1 of 2)]
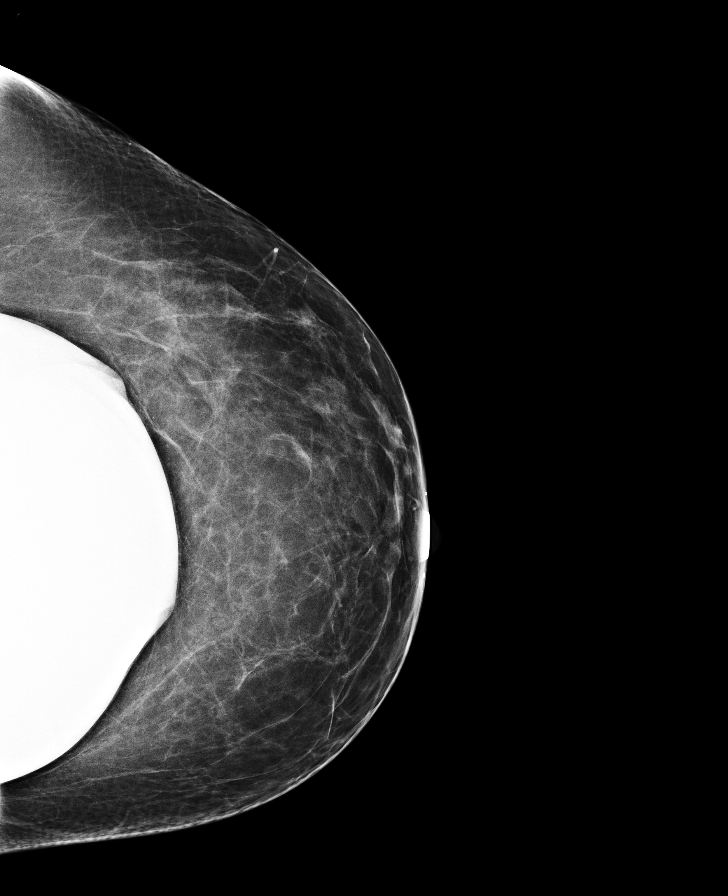

[R CC]
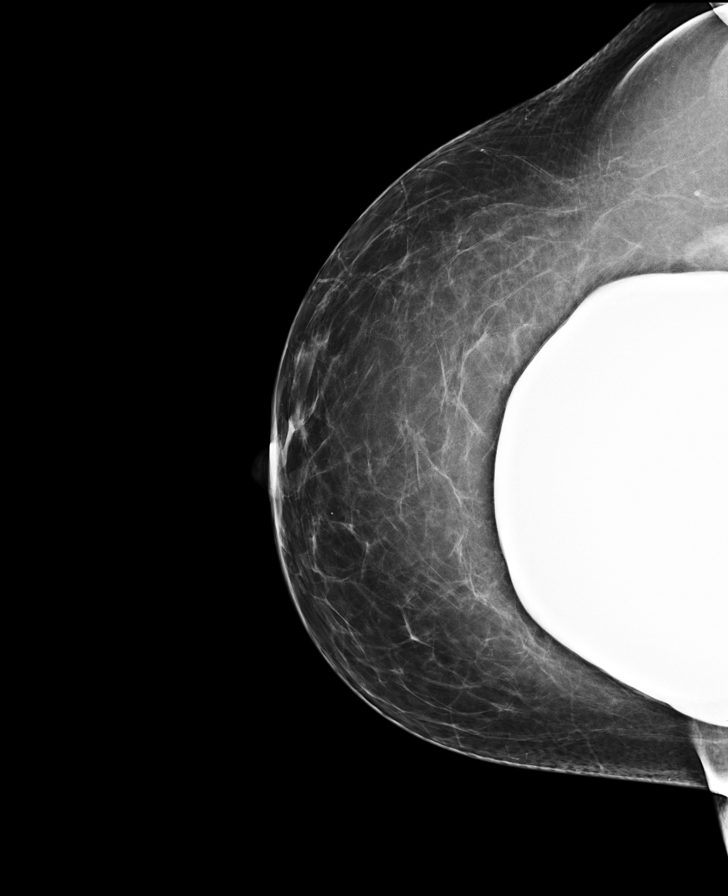

[L MLO (1 of 2)]
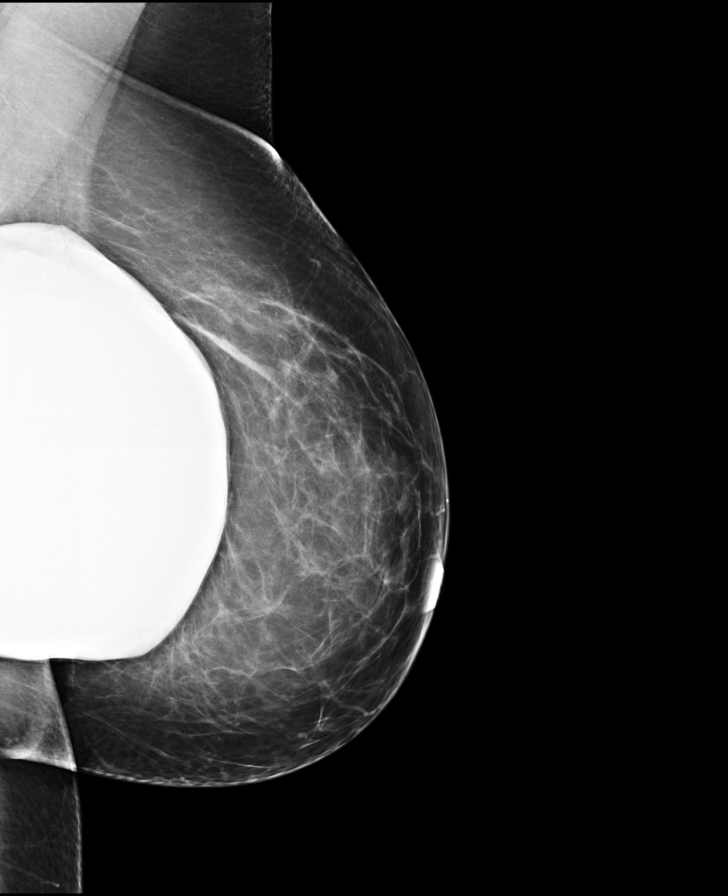

[R MLO]
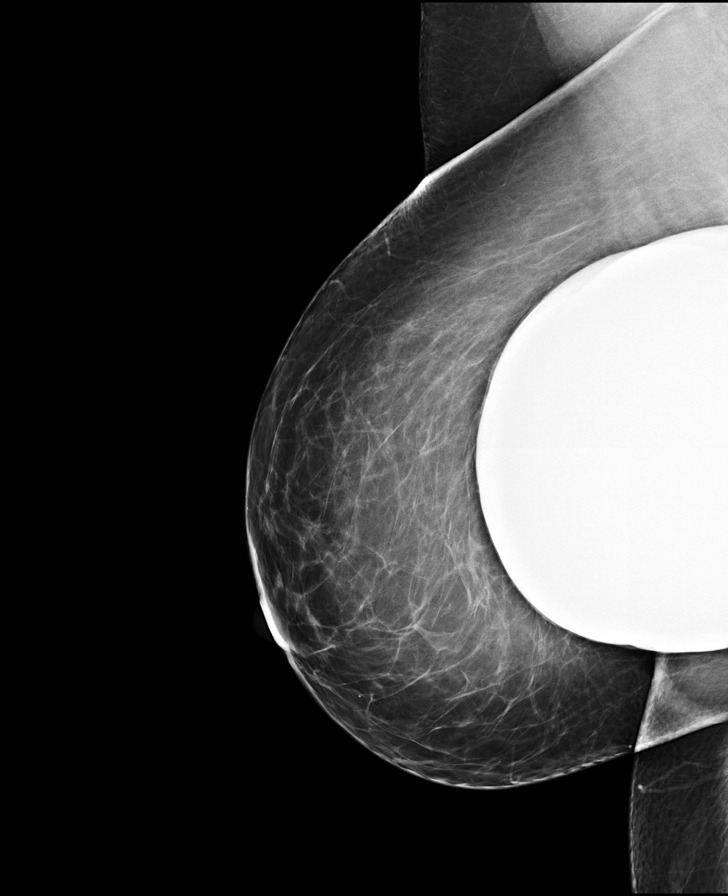

[L CC (2 of 2)]
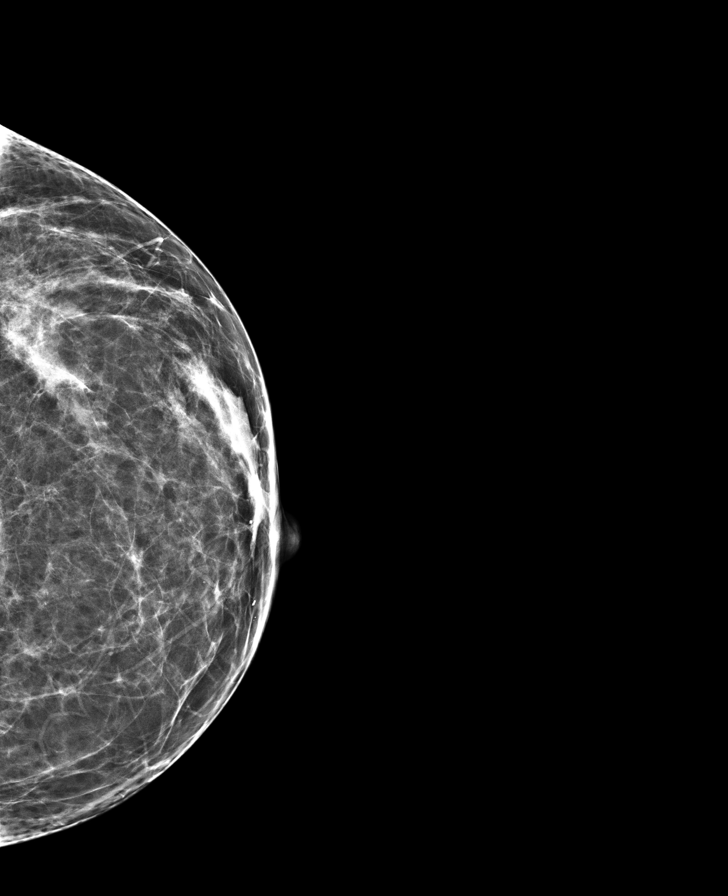

[L MLO (2 of 2)]
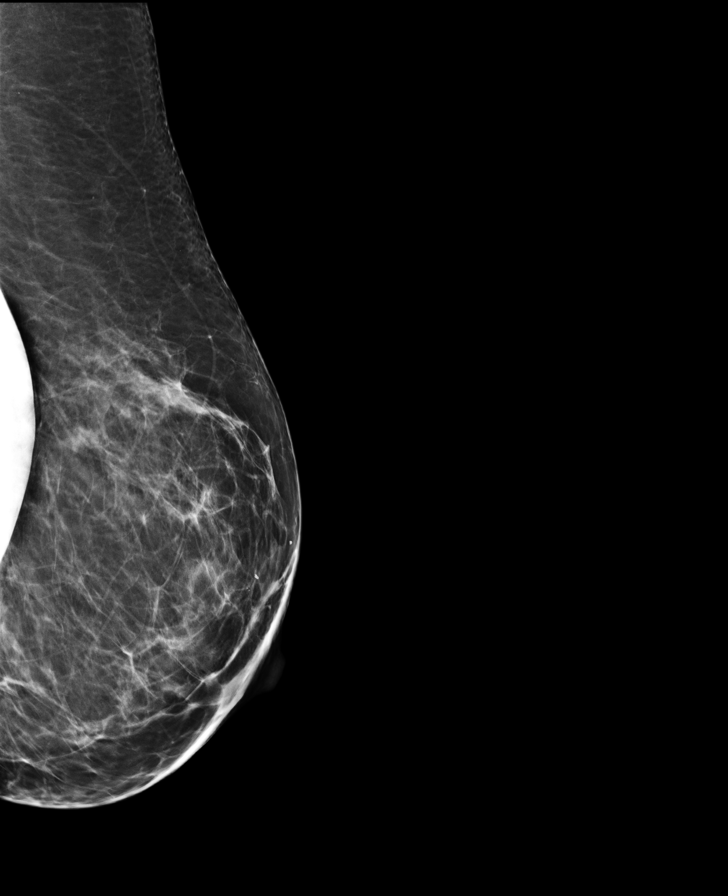

[R CC synth-2D]
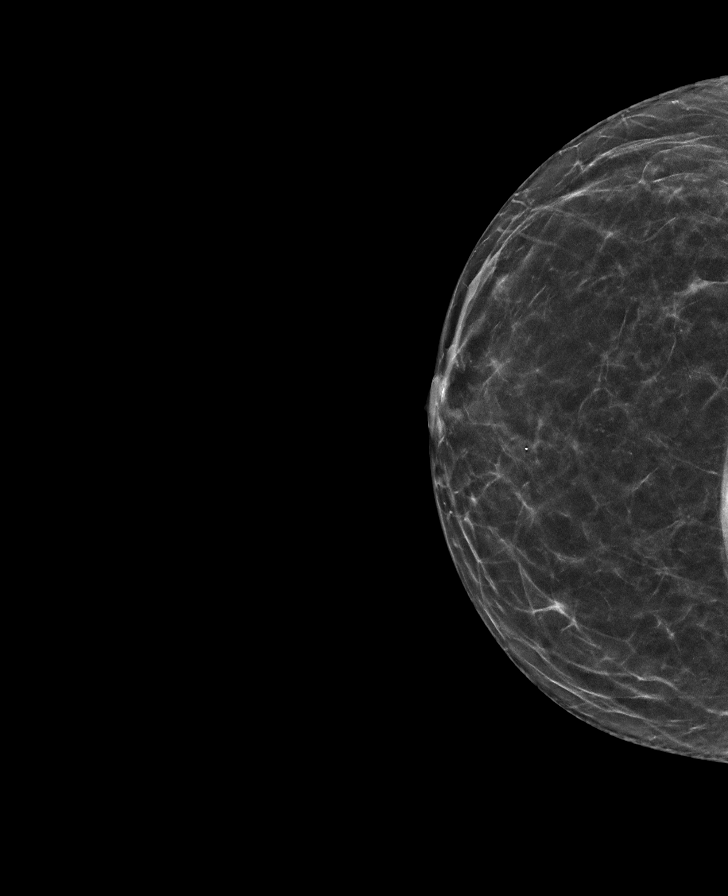

[L CC synth-2D]
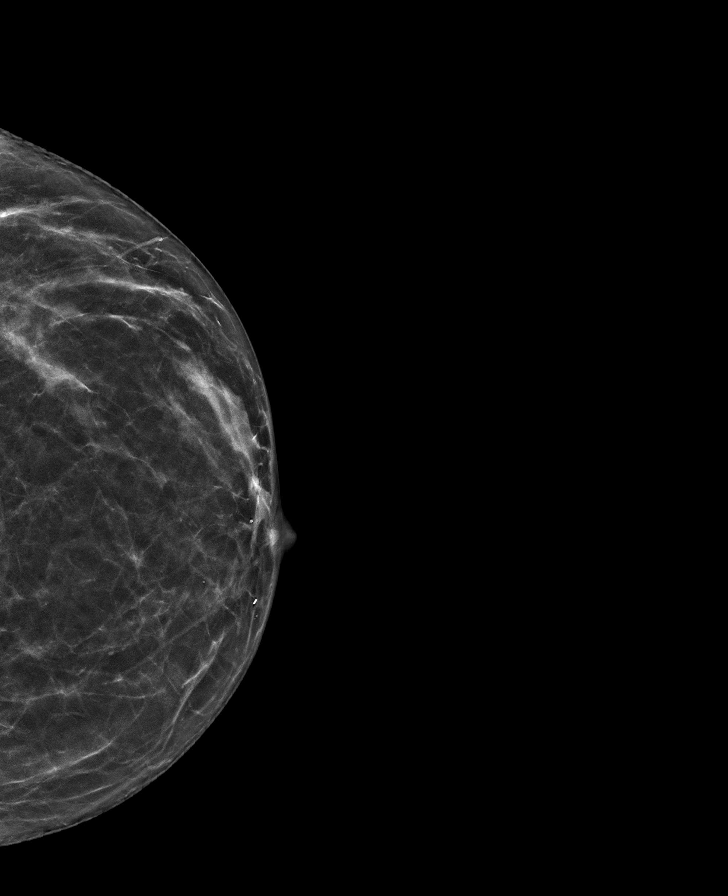

[8 of 32 positions shown; findings below may reference images not displayed]

ACR Breast Density Category b: There are scattered areas of
fibroglandular density.
FINDINGS: There are no findings suspicious for malignancy. Images were
processed with CAD.
IMPRESSION: No mammographic evidence of malignancy. A result letter of this
screening mammogram will be mailed directly to the patient.

RECOMMENDATION:
Screening mammogram in one year. (Code:WE-[id number])

BI-RADS CATEGORY  1:  Negative.

## 2020-08-28 NOTE — Telephone Encounter (Signed)
Left a voicemail to call back and try and reschedule the patient to a sooner appointment.

## 2020-08-28 NOTE — Telephone Encounter (Signed)
Patient states she could not come in today for the 3:30 pm as she has to pick up her kids. Patient states she thinks she is okay to wait until Jan unless Dr. Nicki Reaper feels she needs to be seen sooner.

## 2020-08-28 NOTE — Telephone Encounter (Signed)
Patient advised of below and will call if needs anything or to be seen before her scheduled appt 10/07/20

## 2020-08-28 NOTE — Telephone Encounter (Signed)
If she feels ok to wait, I am ok to hold.  Please inform her to let us know if she needs anything.

## 2020-08-28 NOTE — Telephone Encounter (Signed)
Patient called back and could not come in at 3:30 pm today. Patient states she is okay to wait until her appt in Jan unless Dr. Nicki Reaper feels she needs to be seen sooner.

## 2020-09-04 ENCOUNTER — Ambulatory Visit (INDEPENDENT_AMBULATORY_CARE_PROVIDER_SITE_OTHER): Payer: 59

## 2020-09-04 ENCOUNTER — Other Ambulatory Visit: Payer: Self-pay

## 2020-09-04 DIAGNOSIS — E538 Deficiency of other specified B group vitamins: Secondary | ICD-10-CM

## 2020-09-04 MED ORDER — CYANOCOBALAMIN 1000 MCG/ML IJ SOLN
1000.0000 ug | Freq: Once | INTRAMUSCULAR | Status: AC
  Administered 2020-09-03: 14:00:00 1000 ug via INTRAMUSCULAR

## 2020-09-04 NOTE — Progress Notes (Signed)
Patient presented for B 12 injection to left deltoid, patient voiced no concerns nor showed any signs of distress during injection. 

## 2020-09-08 ENCOUNTER — Other Ambulatory Visit: Payer: Self-pay | Admitting: Internal Medicine

## 2020-09-09 ENCOUNTER — Other Ambulatory Visit: Payer: Self-pay | Admitting: Internal Medicine

## 2020-09-15 ENCOUNTER — Other Ambulatory Visit: Payer: Self-pay | Admitting: Internal Medicine

## 2020-09-17 DIAGNOSIS — M79671 Pain in right foot: Secondary | ICD-10-CM | POA: Diagnosis not present

## 2020-10-02 ENCOUNTER — Other Ambulatory Visit (INDEPENDENT_AMBULATORY_CARE_PROVIDER_SITE_OTHER): Payer: 59

## 2020-10-02 ENCOUNTER — Other Ambulatory Visit: Payer: Self-pay

## 2020-10-02 DIAGNOSIS — M1990 Unspecified osteoarthritis, unspecified site: Secondary | ICD-10-CM | POA: Diagnosis not present

## 2020-10-02 DIAGNOSIS — E7800 Pure hypercholesterolemia, unspecified: Secondary | ICD-10-CM | POA: Diagnosis not present

## 2020-10-02 LAB — LIPID PANEL
Cholesterol: 147 mg/dL (ref 0–200)
HDL: 55.3 mg/dL (ref >39.00)
LDL Cholesterol: 76 mg/dL (ref 0–99)
NonHDL: 91.32
Total CHOL/HDL Ratio: 3
Triglycerides: 77 mg/dL (ref 0.0–149.0)
VLDL: 15.4 mg/dL (ref 0.0–40.0)

## 2020-10-02 LAB — BASIC METABOLIC PANEL
BUN: 9 mg/dL (ref 6–23)
CO2: 29 mEq/L (ref 19–32)
Calcium: 9 mg/dL (ref 8.4–10.5)
Chloride: 104 mEq/L (ref 96–112)
Creatinine, Ser: 0.94 mg/dL (ref 0.40–1.20)
GFR: 71.21 mL/min (ref >60.00)
Glucose, Bld: 77 mg/dL (ref 70–99)
Potassium: 4.5 mEq/L (ref 3.5–5.1)
Sodium: 138 mEq/L (ref 135–145)

## 2020-10-02 LAB — CBC WITH DIFFERENTIAL/PLATELET
Basophils Absolute: 0 10*3/uL (ref 0.0–0.1)
Basophils Relative: 0.6 % (ref 0.0–3.0)
Eosinophils Absolute: 0.2 10*3/uL (ref 0.0–0.7)
Eosinophils Relative: 3.2 % (ref 0.0–5.0)
HCT: 42.3 % (ref 36.0–46.0)
Hemoglobin: 14.5 g/dL (ref 12.0–15.0)
Lymphocytes Relative: 22.7 % (ref 12.0–46.0)
Lymphs Abs: 1.5 10*3/uL (ref 0.7–4.0)
MCHC: 34.2 g/dL (ref 30.0–36.0)
MCV: 91.9 fl (ref 78.0–100.0)
Monocytes Absolute: 0.6 10*3/uL (ref 0.1–1.0)
Monocytes Relative: 8.4 % (ref 3.0–12.0)
Neutro Abs: 4.4 10*3/uL (ref 1.4–7.7)
Neutrophils Relative %: 65.1 % (ref 43.0–77.0)
Platelets: 304 10*3/uL (ref 150.0–400.0)
RBC: 4.6 Mil/uL (ref 3.87–5.11)
RDW: 13.5 % (ref 11.5–15.5)
WBC: 6.7 10*3/uL (ref 4.0–10.5)

## 2020-10-02 LAB — HEPATIC FUNCTION PANEL
ALT: 13 U/L (ref 0–35)
AST: 16 U/L (ref 0–37)
Albumin: 4.6 g/dL (ref 3.5–5.2)
Alkaline Phosphatase: 57 U/L (ref 39–117)
Bilirubin, Direct: 0.1 mg/dL (ref 0.0–0.3)
Total Bilirubin: 0.5 mg/dL (ref 0.2–1.2)
Total Protein: 6.6 g/dL (ref 6.0–8.3)

## 2020-10-06 ENCOUNTER — Encounter: Payer: Self-pay | Admitting: Internal Medicine

## 2020-10-07 ENCOUNTER — Encounter: Payer: 59 | Admitting: Internal Medicine

## 2020-10-10 ENCOUNTER — Ambulatory Visit: Payer: 59

## 2020-10-13 ENCOUNTER — Other Ambulatory Visit: Payer: Self-pay | Admitting: Rheumatology

## 2020-10-16 ENCOUNTER — Other Ambulatory Visit: Payer: Self-pay

## 2020-10-16 ENCOUNTER — Ambulatory Visit (INDEPENDENT_AMBULATORY_CARE_PROVIDER_SITE_OTHER): Payer: 59

## 2020-10-16 DIAGNOSIS — E538 Deficiency of other specified B group vitamins: Secondary | ICD-10-CM

## 2020-10-16 MED ORDER — CYANOCOBALAMIN 1000 MCG/ML IJ SOLN
1000.0000 ug | Freq: Once | INTRAMUSCULAR | Status: AC
  Administered 2020-10-16: 1000 ug via INTRAMUSCULAR

## 2020-10-16 NOTE — Progress Notes (Signed)
Patient presented for B 12 injection to left deltoid, patient voiced no concerns nor showed any signs of distress during injection. 

## 2020-10-21 ENCOUNTER — Other Ambulatory Visit: Payer: Self-pay | Admitting: Rheumatology

## 2020-10-21 DIAGNOSIS — Z79899 Other long term (current) drug therapy: Secondary | ICD-10-CM | POA: Diagnosis not present

## 2020-10-21 DIAGNOSIS — R768 Other specified abnormal immunological findings in serum: Secondary | ICD-10-CM | POA: Diagnosis not present

## 2020-10-21 DIAGNOSIS — M0609 Rheumatoid arthritis without rheumatoid factor, multiple sites: Secondary | ICD-10-CM | POA: Diagnosis not present

## 2020-10-21 DIAGNOSIS — M8949 Other hypertrophic osteoarthropathy, multiple sites: Secondary | ICD-10-CM | POA: Diagnosis not present

## 2020-10-31 ENCOUNTER — Encounter: Payer: Self-pay | Admitting: Internal Medicine

## 2020-10-31 ENCOUNTER — Other Ambulatory Visit: Payer: Self-pay

## 2020-10-31 ENCOUNTER — Ambulatory Visit (INDEPENDENT_AMBULATORY_CARE_PROVIDER_SITE_OTHER): Payer: 59 | Admitting: Internal Medicine

## 2020-10-31 ENCOUNTER — Other Ambulatory Visit: Payer: Self-pay | Admitting: Internal Medicine

## 2020-10-31 VITALS — BP 132/80 | HR 78 | Temp 98.8°F | Resp 16 | Ht 63.0 in | Wt 216.0 lb

## 2020-10-31 DIAGNOSIS — M069 Rheumatoid arthritis, unspecified: Secondary | ICD-10-CM | POA: Diagnosis not present

## 2020-10-31 DIAGNOSIS — M84374D Stress fracture, right foot, subsequent encounter for fracture with routine healing: Secondary | ICD-10-CM

## 2020-10-31 DIAGNOSIS — S92901D Unspecified fracture of right foot, subsequent encounter for fracture with routine healing: Secondary | ICD-10-CM | POA: Diagnosis not present

## 2020-10-31 DIAGNOSIS — F439 Reaction to severe stress, unspecified: Secondary | ICD-10-CM

## 2020-10-31 DIAGNOSIS — Z1211 Encounter for screening for malignant neoplasm of colon: Secondary | ICD-10-CM

## 2020-10-31 DIAGNOSIS — G479 Sleep disorder, unspecified: Secondary | ICD-10-CM | POA: Diagnosis not present

## 2020-10-31 DIAGNOSIS — N926 Irregular menstruation, unspecified: Secondary | ICD-10-CM

## 2020-10-31 DIAGNOSIS — D649 Anemia, unspecified: Secondary | ICD-10-CM | POA: Diagnosis not present

## 2020-10-31 DIAGNOSIS — E7800 Pure hypercholesterolemia, unspecified: Secondary | ICD-10-CM | POA: Diagnosis not present

## 2020-10-31 MED ORDER — TRAZODONE HCL 50 MG PO TABS: 25.0000 mg | ORAL_TABLET | Freq: Every evening | ORAL | 1 refills | Status: DC | PRN

## 2020-10-31 NOTE — Patient Instructions (Signed)
Decrease gabapentin to one per day.  Start trazodone 1/2 tablet before bed.

## 2020-10-31 NOTE — Progress Notes (Signed)
[name], female   DOB: 06/19/1971, 50 y.o.   MRN: [id number] .   Subjective:    [name], female    DOB: 1971/07/06, 50 y.o.   MRN: [id number]  HPI This visit occurred during the SARS-CoV-2 public health emergency.  Safety protocols were in place, including screening questions prior to the visit, additional usage of staff PPE, and extensive cleaning of exam room while observing appropriate contact time as indicated for disinfecting solutions.  Patient here for a scheduled physical.  Changed to follow up appt.  Reports having trouble sleeping.  Taking gabapentin 600mg  q hs.  Has started taking 1/2 xanax with gabapentin to help her sleep.  Discussed other options. Discussed increasing gabapentin.  Not sure if the gabapentin is helping her leg pain.  Has leg aching and joint aches.  Seeing rheumatology.  Affects her walking.  If walks on a level surface - does better. Previous right foot fracture.  Doing better now.  Discussed need for bone density.  Discussed changing medication.  Discussed tapering gabapentin and starting trazodone.  Breathing stable.  No acid reflux or abdominal pain reported.  Period 06/2020.  (had period 05/27/20).  No period from 06/2020 until this week.  Heavy period and heavy bleeding since starting.    Past Medical History:  Diagnosis Date  . Allergy   . Blood in stool    H/O  . History of chicken pox   . Hx: UTI (urinary tract infection)   . Hyperlipidemia    Past Surgical History:  Procedure Laterality Date  . AUGMENTATION MAMMAPLASTY  2001  . CESAREAN SECTION  2007 and 2009  . tummy tuck  2001   Family History  Problem Relation Age of Onset  . Healthy Mother   . Hypertension Mother   . Cancer Father        non hogkins lymphoma  . Atrial fibrillation Father   . Heart disease Father   . Hypertension Father   . Arthritis Sister   . Asthma Sister   . Hypertension Sister   . Healthy Daughter   . Healthy Son   .  Alzheimer's disease Maternal Grandmother   . Healthy Sister   . Arthritis Maternal Grandfather   . Colon cancer Paternal Uncle 27  . Breast cancer Neg Hx    Social History   Socioeconomic History  . Marital status: Married    Spouse name: Not on file  . Number of children: Not on file  . Years of education: Not on file  . Highest education level: Not on file  Occupational History  . Not on file  Tobacco Use  . Smoking status: Never Smoker  . Smokeless tobacco: Never Used  Substance and Sexual Activity  . Alcohol use: Yes    Alcohol/week: 0.0 standard drinks    Comment: occasional  . Drug use: No  . Sexual activity: Not on file  Other Topics Concern  . Not on file  Social History Narrative  . Not on file   Social Determinants of Health   Financial Resource Strain: Not on file  Food Insecurity: Not on file  Transportation Needs: Not on file  Physical Activity: Not on file  Stress: Not on file  Social Connections: Not on file    Outpatient Encounter Medications as of 10/31/2020  Medication Sig  . famotidine (PEPCID) 20 MG tablet Take 20 mg by mouth 2 (two) times daily.  . Melatonin 10 MG TABS Take 10  mg by mouth at bedtime.  . traZODone (DESYREL) 50 MG tablet Take 0.5-1 tablets (25-50 mg total) by mouth at bedtime as needed for sleep.  Marland Kitchen acetaminophen (TYLENOL) 500 MG tablet Take 500 mg by mouth every 6 (six) hours as needed.  . ALPRAZolam (XANAX) 0.25 MG tablet TAKE 1 TABLET BY MOUTH ONCE DAILY AS NEEDED FOR ANXIETY  . buPROPion (WELLBUTRIN SR) 150 MG 12 hr tablet TAKE 1 TABLET BY MOUTH TWICE DAILY  . cetirizine (ZYRTEC) 10 MG tablet Take 10 mg by mouth daily.  . ferrous sulfate 325 (65 FE) MG tablet Take 325 mg by mouth daily with breakfast.  . fluticasone (FLONASE) 50 MCG/ACT nasal spray USE 2 SPRAYS IN EACH NOSTRIL ONCE DAILY  . folic acid (FOLVITE) 1 MG tablet Take 1 mg by mouth daily.  Marland Kitchen gabapentin (NEURONTIN) 300 MG capsule TAKE 2 CAPSULES BY MOUTH BY MOUTH  EVERY EVENING  . hydroxychloroquine (PLAQUENIL) 200 MG tablet One tab twice a day for inflammatory arthritis, 30 days, 60 tabs  . meloxicam (MOBIC) 7.5 MG tablet Take 1 tablet (7.5 mg total) by mouth daily.  . methotrexate (RHEUMATREX) 2.5 MG tablet Take 6 tablets by mouth every 7 (seven) days.  . predniSONE (DELTASONE) 5 MG tablet Take 5 mg by mouth daily.  . rosuvastatin (CRESTOR) 10 MG tablet TAKE 1 TABLET BY MOUTH DAILY   No facility-administered encounter medications on file as of 10/31/2020.    Review of Systems  Constitutional: Negative for appetite change and unexpected weight change.  HENT: Negative for congestion and sinus pressure.   Respiratory: Negative for cough, chest tightness and shortness of breath.   Cardiovascular: Negative for chest pain, palpitations and leg swelling.  Gastrointestinal: Negative for abdominal pain, diarrhea, nausea and vomiting.  Genitourinary: Negative for difficulty urinating and dyspareunia.       Heavy period as outlined.    Musculoskeletal: Negative for myalgias.       Joint pains as outlined.  Leg pain as outlined.   Skin: Negative for color change and rash.  Neurological: Negative for dizziness, light-headedness and headaches.  Psychiatric/Behavioral: Negative for agitation and dysphoric mood.       Objective:    Physical Exam Vitals reviewed.  Constitutional:      General: She is not in acute distress.    Appearance: Normal appearance.  HENT:     Head: Normocephalic and atraumatic.     Right Ear: External ear normal.     Left Ear: External ear normal.     Mouth/Throat:     Mouth: Oropharynx is clear and moist.  Eyes:     General: No scleral icterus.       Right eye: No discharge.        Left eye: No discharge.     Conjunctiva/sclera: Conjunctivae normal.  Neck:     Thyroid: No thyromegaly.  Cardiovascular:     Rate and Rhythm: Normal rate and regular rhythm.  Pulmonary:     Effort: No respiratory distress.     Breath  sounds: Normal breath sounds. No wheezing.  Abdominal:     General: Bowel sounds are normal.     Palpations: Abdomen is soft.     Tenderness: There is no abdominal tenderness.  Musculoskeletal:        General: No swelling, tenderness or edema.     Cervical back: Neck supple. No tenderness.  Lymphadenopathy:     Cervical: No cervical adenopathy.  Skin:    Findings: No erythema or rash.  Neurological:     Mental Status: She is alert.  Psychiatric:        Mood and Affect: Mood normal.        Behavior: Behavior normal.     BP 132/80   Pulse 78   Temp 98.8 F (37.1 C) (Oral)   Resp 16   Ht 5\' 3"  (1.6 m)   Wt 216 lb (98 kg)   SpO2 98%   BMI 38.26 kg/m  Wt Readings from Last 3 Encounters:  10/31/20 216 lb (98 kg)  07/03/20 212 lb 3.2 oz (96.3 kg)  03/20/20 212 lb (96.2 kg)     Lab Results  Component Value Date   WBC 6.7 10/02/2020   HGB 14.5 10/02/2020   HCT 42.3 10/02/2020   PLT 304.0 10/02/2020   GLUCOSE 77 10/02/2020   CHOL 147 10/02/2020   TRIG 77.0 10/02/2020   HDL 55.30 10/02/2020   LDLDIRECT 87.0 04/03/2019   LDLCALC 76 10/02/2020   ALT 13 10/02/2020   AST 16 10/02/2020   NA 138 10/02/2020   K 4.5 10/02/2020   CL 104 10/02/2020   CREATININE 0.94 10/02/2020   BUN 9 10/02/2020   CO2 29 10/02/2020   TSH 1.80 07/01/2020    MM 3D SCREEN BREAST W/IMPLANT BILATERAL  Result Date: 08/31/2020 CLINICAL DATA:  Screening. EXAM: DIGITAL SCREENING BILATERAL MAMMOGRAM WITH IMPLANTS, CAD AND TOMO COMPARISON:  Previous exam(s). ACR Breast Density Category b: There are scattered areas of fibroglandular density. FINDINGS: The patient has retropectoral gel implants. Standard 2D full field CC and MLO views of both breasts and tomosynthesis and synthesized implant displaced CC and MLO views of both breasts were obtained. There are no findings suspicious for malignancy. Images were processed with CAD. IMPRESSION: No mammographic evidence of malignancy. A result letter of this  screening mammogram will be mailed directly to the patient. RECOMMENDATION: Screening mammogram in one year. (Code:SM-B-01Y) BI-RADS CATEGORY  1:  Negative. Electronically Signed   By: Evangeline Dakin M.D.   On: 08/31/2020 09:58       Assessment & Plan:   Problem List Items Addressed This Visit    Anemia    History of IDA.  Follow cbc and iron studies.        Colon cancer screening    Needs colonoscopy.  Refer.        Relevant Orders   Ambulatory referral to Gastroenterology   Difficulty sleeping    Was doing well on gabapentin.  Does not feel helping now.  Taking xanax.  Discussed would like to get her off of this - taking regularly.  Will decrease gabapentin.  Start trazodone 50mg  1/2 tablet q hs.  Increase as tolerated.  Follow closely.        Foot fracture, right    With recent foot fracture felt to be stress fracture, will check bone density.  Requiring prednisone for her arthritis.  Follow.        Hypercholesterolemia    On crestor.  Low cholesterol diet and exercise.  Follow lipid panel and liver function tests.   Lab Results  Component Value Date   CHOL 147 10/02/2020   HDL 55.30 10/02/2020   LDLCALC 76 10/02/2020   LDLDIRECT 87.0 04/03/2019   TRIG 77.0 10/02/2020   CHOLHDL 3 10/02/2020        Menstrual irregularity    Periods as outlined.  Heavy period currently.  Previous period in 06/2020.  Keep menstrual diary.  Follow.  Rheumatoid arthritis ([place])    Followed by Dr Jefm Bryant.  On MTX, plaquenil and low dose prednisone.  Started cimzia.  Joint aches.  Follow.       Relevant Medications   predniSONE (DELTASONE) 5 MG tablet   Stress    Increased stress.  On wellbutrin.  Overall appears to be handling things relatively well.  Follow.        Other Visit Diagnoses    Stress fracture of right foot with routine healing, subsequent encounter    -  Primary   Relevant Orders   DG Bone Density       Einar Pheasant, MD

## 2020-11-02 ENCOUNTER — Encounter: Payer: Self-pay | Admitting: Internal Medicine

## 2020-11-02 DIAGNOSIS — Z1211 Encounter for screening for malignant neoplasm of colon: Secondary | ICD-10-CM | POA: Insufficient documentation

## 2020-11-02 DIAGNOSIS — S92901A Unspecified fracture of right foot, initial encounter for closed fracture: Secondary | ICD-10-CM | POA: Insufficient documentation

## 2020-11-02 NOTE — Assessment & Plan Note (Signed)
History of IDA.  Follow cbc and iron studies.   

## 2020-11-02 NOTE — Assessment & Plan Note (Signed)
With recent foot fracture felt to be stress fracture, will check bone density.  Requiring prednisone for her arthritis.  Follow.

## 2020-11-02 NOTE — Assessment & Plan Note (Signed)
Increased stress.  On wellbutrin.  Overall appears to be handling things relatively well.  Follow.

## 2020-11-02 NOTE — Assessment & Plan Note (Signed)
Periods as outlined.  Heavy period currently.  Previous period in 06/2020.  Keep menstrual diary.  Follow.

## 2020-11-02 NOTE — Assessment & Plan Note (Signed)
Needs colonoscopy.  Refer.

## 2020-11-02 NOTE — Assessment & Plan Note (Signed)
On crestor.  Low cholesterol diet and exercise.  Follow lipid panel and liver function tests.   Lab Results  Component Value Date   CHOL 147 10/02/2020   HDL 55.30 10/02/2020   LDLCALC 76 10/02/2020   LDLDIRECT 87.0 04/03/2019   TRIG 77.0 10/02/2020   CHOLHDL 3 10/02/2020

## 2020-11-02 NOTE — Assessment & Plan Note (Signed)
Followed by Dr Jefm Bryant.  On MTX, plaquenil and low dose prednisone.  Started cimzia.  Joint aches.  Follow.

## 2020-11-02 NOTE — Assessment & Plan Note (Signed)
Was doing well on gabapentin.  Does not feel helping now.  Taking xanax.  Discussed would like to get her off of this - taking regularly.  Will decrease gabapentin.  Start trazodone 50mg  1/2 tablet q hs.  Increase as tolerated.  Follow closely.

## 2020-11-03 ENCOUNTER — Encounter: Payer: Self-pay | Admitting: Internal Medicine

## 2020-11-04 DIAGNOSIS — M0609 Rheumatoid arthritis without rheumatoid factor, multiple sites: Secondary | ICD-10-CM | POA: Diagnosis not present

## 2020-11-05 ENCOUNTER — Other Ambulatory Visit: Payer: Self-pay | Admitting: Internal Medicine

## 2020-11-17 ENCOUNTER — Other Ambulatory Visit: Payer: Self-pay | Admitting: Internal Medicine

## 2020-11-18 ENCOUNTER — Ambulatory Visit: Payer: 59

## 2020-11-18 DIAGNOSIS — M0609 Rheumatoid arthritis without rheumatoid factor, multiple sites: Secondary | ICD-10-CM | POA: Diagnosis not present

## 2020-11-19 ENCOUNTER — Other Ambulatory Visit: Payer: Self-pay

## 2020-11-19 ENCOUNTER — Ambulatory Visit (INDEPENDENT_AMBULATORY_CARE_PROVIDER_SITE_OTHER): Payer: 59

## 2020-11-19 DIAGNOSIS — E538 Deficiency of other specified B group vitamins: Secondary | ICD-10-CM | POA: Diagnosis not present

## 2020-11-19 MED ORDER — CYANOCOBALAMIN 1000 MCG/ML IJ SOLN
1000.0000 ug | Freq: Once | INTRAMUSCULAR | Status: AC
  Administered 2020-11-19: 1000 ug via INTRAMUSCULAR

## 2020-11-19 NOTE — Progress Notes (Signed)
Patient presented for B 12 injection to left deltoid, patient voiced no concerns nor showed any signs of distress during injection. Nina,cma  

## 2020-11-25 ENCOUNTER — Other Ambulatory Visit: Payer: Self-pay | Admitting: Internal Medicine

## 2020-11-25 ENCOUNTER — Ambulatory Visit
Admission: RE | Admit: 2020-11-25 | Discharge: 2020-11-25 | Disposition: A | Discharge status: Home / Self Care | Payer: 59 | Source: Ambulatory Visit | Attending: Internal Medicine | Admitting: Internal Medicine

## 2020-11-25 ENCOUNTER — Other Ambulatory Visit: Payer: Self-pay

## 2020-11-25 ENCOUNTER — Encounter: Payer: Self-pay | Admitting: Internal Medicine

## 2020-11-25 DIAGNOSIS — M84374D Stress fracture, right foot, subsequent encounter for fracture with routine healing: Secondary | ICD-10-CM | POA: Diagnosis not present

## 2020-11-25 DIAGNOSIS — Z780 Asymptomatic menopausal state: Secondary | ICD-10-CM | POA: Diagnosis not present

## 2020-11-25 DIAGNOSIS — E538 Deficiency of other specified B group vitamins: Secondary | ICD-10-CM | POA: Diagnosis not present

## 2020-11-26 NOTE — Telephone Encounter (Signed)
Can schedule an appt before April to discuss if she desires.  Also, how long has she been off the trazodone?  May still be affecting if she just stopped.  Does she feel the gabapentin is helping?

## 2020-11-28 NOTE — Telephone Encounter (Signed)
Are you ok to postpone 4/8 appt if we do virtual next week?

## 2020-11-28 NOTE — Telephone Encounter (Signed)
See if agreeable to schedule a virtual visit to discuss.

## 2020-11-28 NOTE — Telephone Encounter (Signed)
Yes

## 2020-12-04 ENCOUNTER — Telehealth (INDEPENDENT_AMBULATORY_CARE_PROVIDER_SITE_OTHER): Payer: 59 | Admitting: Internal Medicine

## 2020-12-04 DIAGNOSIS — F439 Reaction to severe stress, unspecified: Secondary | ICD-10-CM | POA: Diagnosis not present

## 2020-12-04 DIAGNOSIS — G479 Sleep disorder, unspecified: Secondary | ICD-10-CM

## 2020-12-04 DIAGNOSIS — E7800 Pure hypercholesterolemia, unspecified: Secondary | ICD-10-CM

## 2020-12-04 DIAGNOSIS — M069 Rheumatoid arthritis, unspecified: Secondary | ICD-10-CM

## 2020-12-04 NOTE — Progress Notes (Signed)
[name], female   DOB: 1971-03-15, 50 y.o.   MRN: [id number]   Virtual Visit via video Note  This visit type was conducted due to national recommendations for restrictions regarding the COVID-19 pandemic (e.g. social distancing).  This format is felt to be most appropriate for this patient at this time.  All issues noted in this document were discussed and addressed.  No physical exam was performed (except for noted visual exam findings with Video Visits).   I connected with Tamara Shah by a video enabled telemedicine application and verified that I am speaking with the correct person using two identifiers. Location patient: home Location provider: work Persons participating in the virtual visit: patient, provider  The limitations, risks, security and privacy concerns of performing an evaluation and management service by video and the availability of in person appointments have been discussed.  It has also been discussed with the patient that there may be a patient responsible charge related to this service. The patient expressed understanding and agreed to proceed.   Reason for visit: work in appt  HPI: Work in appt to discuss sleep issues.  Has been taking gabapentin.  Has done relatively well with this medication.  Trazodone caused crazy dreams.  Having some issues with sleeping.  cimzia injections - helping pain/joints.  She is starting to go out without her cane.  Taking wellbutrin. Discussed treatment options and variations in medications to help with her sleep.  No chest pain or sob reported.  No abdominal pain or bowel change reported.  Handling stress.    ROS: See pertinent positives and negatives per HPI.  Past Medical History:  Diagnosis Date  . Allergy   . Blood in stool    H/O  . History of chicken pox   . Hx: UTI (urinary tract infection)   . Hyperlipidemia     Past Surgical History:  Procedure Laterality Date  . AUGMENTATION MAMMAPLASTY  2001  .  CESAREAN SECTION  2007 and 2009  . tummy tuck  2001    Family History  Problem Relation Age of Onset  . Healthy Mother   . Hypertension Mother   . Cancer Father        non hogkins lymphoma  . Atrial fibrillation Father   . Heart disease Father   . Hypertension Father   . Arthritis Sister   . Asthma Sister   . Hypertension Sister   . Healthy Daughter   . Healthy Son   . Alzheimer's disease Maternal Grandmother   . Healthy Sister   . Arthritis Maternal Grandfather   . Colon cancer Paternal Uncle 55  . Breast cancer Neg Hx     SOCIAL HX: reviewed    Current Outpatient Medications:  .  acetaminophen (TYLENOL) 500 MG tablet, Take 500 mg by mouth every 6 (six) hours as needed., Disp: , Rfl:  .  ALPRAZolam (XANAX) 0.25 MG tablet, TAKE 1 TABLET BY MOUTH ONCE DAILY AS NEEDED FOR ANXIETY, Disp: 30 tablet, Rfl: 0 .  buPROPion (WELLBUTRIN SR) 150 MG 12 hr tablet, TAKE 1 TABLET BY MOUTH TWICE DAILY, Disp: 60 tablet, Rfl: 1 .  cetirizine (ZYRTEC) 10 MG tablet, Take 10 mg by mouth daily., Disp: , Rfl:  .  famotidine (PEPCID) 20 MG tablet, Take 20 mg by mouth 2 (two) times daily., Disp: , Rfl:  .  ferrous sulfate 325 (65 FE) MG tablet, Take 325 mg by mouth daily with breakfast., Disp: , Rfl:  .  fluticasone (  FLONASE) 50 MCG/ACT nasal spray, USE 2 SPRAYS IN EACH NOSTRIL ONCE DAILY, Disp: 16 g, Rfl: 2 .  folic acid (FOLVITE) 1 MG tablet, Take 1 mg by mouth daily., Disp: , Rfl:  .  gabapentin (NEURONTIN) 300 MG capsule, TAKE 2 CAPSULES BY MOUTH EVERY EVENING, Disp: 60 capsule, Rfl: 2 .  hydroxychloroquine (PLAQUENIL) 200 MG tablet, One tab twice a day for inflammatory arthritis, 30 days, 60 tabs, Disp: , Rfl:  .  Melatonin 10 MG TABS, Take 10 mg by mouth at bedtime., Disp: , Rfl:  .  meloxicam (MOBIC) 7.5 MG tablet, Take 1 tablet (7.5 mg total) by mouth daily., Disp: 30 tablet, Rfl: 0 .  methotrexate (RHEUMATREX) 2.5 MG tablet, Take 6 tablets by mouth every 7 (seven) days., Disp: , Rfl:  .   predniSONE (DELTASONE) 5 MG tablet, Take 5 mg by mouth daily., Disp: , Rfl:  .  rosuvastatin (CRESTOR) 10 MG tablet, TAKE 1 TABLET BY MOUTH DAILY, Disp: 90 tablet, Rfl: 2 .  traZODone (DESYREL) 50 MG tablet, Take 0.5-1 tablets (25-50 mg total) by mouth at bedtime as needed for sleep., Disp: 30 tablet, Rfl: 1  EXAM:  GENERAL: alert, oriented, appears well and in no acute distress  HEENT: atraumatic, conjunttiva clear, no obvious abnormalities on inspection of external nose and ears  NECK: normal movements of the head and neck  LUNGS: on inspection no signs of respiratory distress, breathing rate appears normal, no obvious gross SOB, gasping or wheezing  CV: no obvious cyanosis  PSYCH/NEURO: pleasant and cooperative, no obvious depression or anxiety, speech and thought processing grossly intact  ASSESSMENT AND PLAN:  Discussed the following assessment and plan:  Problem List Items Addressed This Visit    Difficulty sleeping    Off trazodone.  Intolerance.  Continue gabapentin.  Will adjust wellbutrin first.  Change timing of second dose of wellbutrin.  Take earlier in day.  Continue current dose of gabapentin for now.  Follow.  Call with update.       Hypercholesterolemia    Continues crestor.  Follow lipid panel and liver function tests.        Rheumatoid arthritis ([place])    Followed by Dr Jefm Bryant.  On MTX, and recently started cimzia.  Doing better.  Follow.       Stress    On wellbutrin.  Overall doing well.  Follow.           I discussed the assessment and treatment plan with the patient. The patient was provided an opportunity to ask questions and all were answered. The patient agreed with the plan and demonstrated an understanding of the instructions.   The patient was advised to call back or seek an in-person evaluation if the symptoms worsen or if the condition fails to improve as anticipated.    Einar Pheasant, MD

## 2020-12-11 ENCOUNTER — Encounter: Payer: Self-pay | Admitting: Internal Medicine

## 2020-12-13 ENCOUNTER — Encounter: Payer: Self-pay | Admitting: Internal Medicine

## 2020-12-13 NOTE — Assessment & Plan Note (Signed)
Off trazodone.  Intolerance.  Continue gabapentin.  Will adjust wellbutrin first.  Change timing of second dose of wellbutrin.  Take earlier in day.  Continue current dose of gabapentin for now.  Follow.  Call with update.

## 2020-12-13 NOTE — Assessment & Plan Note (Signed)
On wellbutrin.  Overall doing well.  Follow.

## 2020-12-13 NOTE — Assessment & Plan Note (Signed)
Followed by Dr Jefm Bryant.  On MTX, and recently started cimzia.  Doing better.  Follow.

## 2020-12-13 NOTE — Assessment & Plan Note (Signed)
Continues crestor.  Follow lipid panel and liver function tests.

## 2020-12-15 ENCOUNTER — Encounter: Payer: Self-pay | Admitting: Internal Medicine

## 2020-12-15 ENCOUNTER — Other Ambulatory Visit: Payer: Self-pay | Admitting: Internal Medicine

## 2020-12-15 MED ORDER — GABAPENTIN 300 MG PO CAPS
ORAL_CAPSULE | ORAL | 1 refills | Status: DC
Start: 2020-12-15 — End: 2020-12-15

## 2020-12-15 NOTE — Telephone Encounter (Signed)
rx sent in for gabapentin 3 q hs #90 with one refill.

## 2020-12-16 DIAGNOSIS — M0609 Rheumatoid arthritis without rheumatoid factor, multiple sites: Secondary | ICD-10-CM | POA: Diagnosis not present

## 2020-12-19 NOTE — Telephone Encounter (Signed)
Called and spoke with pharmacy, they do have script for 3 caps qhs. They filled the old script. Note has been made to do early fill when needed and insurance override. Left detailed message for patient letting her know.

## 2020-12-19 NOTE — Telephone Encounter (Signed)
Please notify pharmacy and [facility] - the prescription has been sent in for gabapentin 3 qhs

## 2020-12-22 ENCOUNTER — Other Ambulatory Visit: Payer: Self-pay

## 2020-12-22 MED FILL — Folic Acid Tab 1 MG: ORAL | 30 days supply | Qty: 30 | Fill #0 | Status: AC

## 2020-12-22 MED FILL — Methotrexate Sodium Tab 2.5 MG (Base Equiv): ORAL | 28 days supply | Qty: 24 | Fill #0 | Status: AC

## 2020-12-23 ENCOUNTER — Other Ambulatory Visit: Payer: Self-pay

## 2020-12-25 DIAGNOSIS — Z760 Encounter for issue of repeat prescription: Secondary | ICD-10-CM | POA: Diagnosis not present

## 2020-12-26 ENCOUNTER — Ambulatory Visit (INDEPENDENT_AMBULATORY_CARE_PROVIDER_SITE_OTHER): Payer: 59

## 2020-12-26 ENCOUNTER — Ambulatory Visit: Payer: 59 | Admitting: Internal Medicine

## 2020-12-26 ENCOUNTER — Other Ambulatory Visit: Payer: Self-pay

## 2020-12-26 DIAGNOSIS — E538 Deficiency of other specified B group vitamins: Secondary | ICD-10-CM | POA: Diagnosis not present

## 2020-12-26 MED ORDER — CYANOCOBALAMIN 1000 MCG/ML IJ SOLN
1000.0000 ug | Freq: Once | INTRAMUSCULAR | Status: AC
  Administered 2020-12-26: 1000 ug via INTRAMUSCULAR

## 2020-12-26 NOTE — Progress Notes (Signed)
Patient presented for B 12 injection to left deltoid, patient voiced no concerns nor showed any signs of distress during injection. 

## 2021-01-09 ENCOUNTER — Ambulatory Visit: Payer: 59 | Attending: Internal Medicine

## 2021-01-09 ENCOUNTER — Other Ambulatory Visit: Payer: Self-pay | Admitting: Rheumatology

## 2021-01-09 ENCOUNTER — Other Ambulatory Visit: Payer: Self-pay

## 2021-01-09 DIAGNOSIS — Z23 Encounter for immunization: Secondary | ICD-10-CM

## 2021-01-09 MED FILL — Gabapentin Cap 300 MG: ORAL | 30 days supply | Qty: 90 | Fill #0 | Status: CN

## 2021-01-09 MED FILL — Gabapentin Cap 300 MG: ORAL | 30 days supply | Qty: 90 | Fill #0 | Status: AC

## 2021-01-09 NOTE — Progress Notes (Signed)
   Covid-19 Vaccination [facility]  Name:  Tamara Shah    MRN: [id number] DOB: [birth year]  01/09/2021  Tamara Shah was observed post Covid-19 immunization for 15 minutes without incident. She was provided with Vaccine Information Sheet and instruction to access the V-Safe system.   Tamara Shah was instructed to call 911 with any severe reactions post vaccine: Marland Kitchen Difficulty breathing  . Swelling of face and throat  . A fast heartbeat  . A bad rash all over body  . Dizziness and weakness   Immunizations Administered    Name Date Dose VIS Date Route   PFIZER Comrnaty(Gray TOP) Covid-19 Vaccine 01/09/2021 11:05 AM 0.3 mL 08/28/2020 Intramuscular   Manufacturer: [employer]   Lot: [id number]   [employer]: [id number]

## 2021-01-12 ENCOUNTER — Other Ambulatory Visit: Payer: Self-pay | Admitting: Internal Medicine

## 2021-01-12 ENCOUNTER — Other Ambulatory Visit: Payer: Self-pay

## 2021-01-12 MED ORDER — BUPROPION HCL ER (SR) 150 MG PO TB12
ORAL_TABLET | Freq: Two times a day (BID) | ORAL | 1 refills | Status: DC
  Filled 2021-01-12: qty 60, 30d supply, fill #0
  Filled 2021-02-16: qty 60, 30d supply, fill #1

## 2021-01-12 MED FILL — Meloxicam Tab 7.5 MG: ORAL | 30 days supply | Qty: 60 | Fill #0 | Status: AC

## 2021-01-13 ENCOUNTER — Other Ambulatory Visit: Payer: Self-pay

## 2021-01-13 MED ORDER — PREDNISONE 5 MG PO TABS
ORAL_TABLET | ORAL | 0 refills | Status: DC
  Filled 2021-01-13: qty 90, 90d supply, fill #0

## 2021-01-13 MED ORDER — PFIZER-BIONT COVID-19 VAC-TRIS 30 MCG/0.3ML IM SUSP
INTRAMUSCULAR | 0 refills | Status: DC
  Filled 2021-01-13: qty 0.3, 20d supply, fill #0

## 2021-01-14 ENCOUNTER — Other Ambulatory Visit: Payer: Self-pay

## 2021-01-19 ENCOUNTER — Other Ambulatory Visit: Payer: Self-pay | Admitting: Rheumatology

## 2021-01-19 ENCOUNTER — Other Ambulatory Visit: Payer: Self-pay

## 2021-01-19 MED FILL — Folic Acid Tab 1 MG: ORAL | 30 days supply | Qty: 30 | Fill #1 | Status: AC

## 2021-01-20 ENCOUNTER — Other Ambulatory Visit: Payer: Self-pay

## 2021-01-20 DIAGNOSIS — M0609 Rheumatoid arthritis without rheumatoid factor, multiple sites: Secondary | ICD-10-CM | POA: Diagnosis not present

## 2021-01-20 DIAGNOSIS — Z79899 Other long term (current) drug therapy: Secondary | ICD-10-CM | POA: Diagnosis not present

## 2021-01-20 DIAGNOSIS — M159 Polyosteoarthritis, unspecified: Secondary | ICD-10-CM | POA: Diagnosis not present

## 2021-01-20 MED ORDER — METHOTREXATE 2.5 MG PO TABS
ORAL_TABLET | ORAL | 1 refills | Status: DC
  Filled 2021-01-20: qty 48, 84d supply, fill #0

## 2021-01-20 MED ORDER — HYDROXYCHLOROQUINE SULFATE 200 MG PO TABS
ORAL_TABLET | ORAL | 1 refills | Status: DC
  Filled 2021-01-20: qty 180, 90d supply, fill #0
  Filled 2021-04-20: qty 180, 90d supply, fill #1

## 2021-01-22 ENCOUNTER — Other Ambulatory Visit: Payer: Self-pay

## 2021-01-22 MED FILL — Fluticasone Propionate Nasal Susp 50 MCG/ACT: NASAL | 30 days supply | Qty: 16 | Fill #0 | Status: AC

## 2021-01-27 ENCOUNTER — Other Ambulatory Visit: Payer: Self-pay

## 2021-01-27 ENCOUNTER — Encounter: Payer: Self-pay | Admitting: Internal Medicine

## 2021-01-27 ENCOUNTER — Ambulatory Visit (INDEPENDENT_AMBULATORY_CARE_PROVIDER_SITE_OTHER): Payer: 59 | Admitting: Internal Medicine

## 2021-01-27 VITALS — BP 116/68 | HR 88 | Temp 97.8°F | Ht 63.0 in | Wt 214.8 lb

## 2021-01-27 DIAGNOSIS — M069 Rheumatoid arthritis, unspecified: Secondary | ICD-10-CM

## 2021-01-27 DIAGNOSIS — D75839 Thrombocytosis, unspecified: Secondary | ICD-10-CM

## 2021-01-27 DIAGNOSIS — E538 Deficiency of other specified B group vitamins: Secondary | ICD-10-CM

## 2021-01-27 DIAGNOSIS — E7800 Pure hypercholesterolemia, unspecified: Secondary | ICD-10-CM

## 2021-01-27 DIAGNOSIS — F439 Reaction to severe stress, unspecified: Secondary | ICD-10-CM

## 2021-01-27 DIAGNOSIS — D649 Anemia, unspecified: Secondary | ICD-10-CM

## 2021-01-27 DIAGNOSIS — D473 Essential (hemorrhagic) thrombocythemia: Secondary | ICD-10-CM

## 2021-01-27 MED ORDER — CYANOCOBALAMIN 1000 MCG/ML IJ SOLN
1000.0000 ug | Freq: Once | INTRAMUSCULAR | Status: AC
  Administered 2021-01-27: 1000 ug via INTRAMUSCULAR

## 2021-01-27 NOTE — Progress Notes (Signed)
[name], female   DOB: 08-17-1971, 50 y.o.   MRN: [id number]   Subjective:    [name], female    DOB: 1970-10-26, 50 y.o.   MRN: [id number]  HPI This visit occurred during the SARS-CoV-2 public health emergency.  Safety protocols were in place, including screening questions prior to the visit, additional usage of staff PPE, and extensive cleaning of exam room while observing appropriate contact time as indicated for disinfecting solutions.  Patient here for a scheduled physical exam.  She is doing well. Feels better.  Off prednisone.  Getting around better.  No chest pain.  Breathing stable.  Normal bone density 11/2020.  No acid reflux.  No abdominal pain.  Bowels moving. Handling stress.  Due colonoscopy.    Past Medical History:  Diagnosis Date  . Allergy   . Blood in stool    H/O  . History of chicken pox   . Hx: UTI (urinary tract infection)   . Hyperlipidemia    Past Surgical History:  Procedure Laterality Date  . AUGMENTATION MAMMAPLASTY  2001  . CESAREAN SECTION  2007 and 2009  . tummy tuck  2001   Family History  Problem Relation Age of Onset  . Healthy Mother   . Hypertension Mother   . Cancer Father        non hogkins lymphoma  . Atrial fibrillation Father   . Heart disease Father   . Hypertension Father   . Arthritis Sister   . Asthma Sister   . Hypertension Sister   . Healthy Daughter   . Healthy Son   . Alzheimer's disease Maternal Grandmother   . Healthy Sister   . Arthritis Maternal Grandfather   . Colon cancer Paternal Uncle 74  . Breast cancer Neg Hx    Social History   Socioeconomic History  . Marital status: Married    Spouse name: Not on file  . Number of children: Not on file  . Years of education: Not on file  . Highest education level: Not on file  Occupational History  . Not on file  Tobacco Use  . Smoking status: Never Smoker  . Smokeless tobacco: Never Used  Substance and Sexual Activity  .  Alcohol use: Yes    Alcohol/week: 0.0 standard drinks    Comment: occasional  . Drug use: No  . Sexual activity: Not on file  Other Topics Concern  . Not on file  Social History Narrative  . Not on file   Social Determinants of Health   Financial Resource Strain: Not on file  Food Insecurity: Not on file  Transportation Needs: Not on file  Physical Activity: Not on file  Stress: Not on file  Social Connections: Not on file    Outpatient Encounter Medications as of 01/27/2021  Medication Sig  . acetaminophen (TYLENOL) 500 MG tablet Take 500 mg by mouth every 6 (six) hours as needed.  . ALPRAZolam (XANAX) 0.25 MG tablet TAKE 1 TABLET BY MOUTH ONCE DAILY AS NEEDED FOR ANXIETY  . buPROPion (WELLBUTRIN SR) 150 MG 12 hr tablet TAKE 1 TABLET BY MOUTH TWICE DAILY  . Certolizumab Pegol 2 X 200 MG/ML PSKT Inject into the skin every 30 (thirty) days.  . cetirizine (ZYRTEC) 10 MG tablet Take 10 mg by mouth daily.  . famotidine (PEPCID) 20 MG tablet Take 20 mg by mouth 2 (two) times daily.  . ferrous sulfate 325 (65 FE) MG tablet Take 325 mg by  mouth daily with breakfast.  . fluticasone (FLONASE) 50 MCG/ACT nasal spray USE 2 SPRAYS IN EACH NOSTRIL ONCE DAILY  . folic acid (FOLVITE) 1 MG tablet Take 1 mg by mouth daily.  Marland Kitchen gabapentin (NEURONTIN) 300 MG capsule TAKE 3 CAPSULES BY MOUTH EVERY EVENING  . hydroxychloroquine (PLAQUENIL) 200 MG tablet TAKE 1 TABLET BY MOUTH TWICE DAILY FOR INFLAMMATORY ARTHRITIS  . Melatonin 10 MG TABS Take 10 mg by mouth at bedtime.  . meloxicam (MOBIC) 7.5 MG tablet TAKE 1 TABLET BY MOUTH TWICE DAILY WITH MEALS  . methotrexate (RHEUMATREX) 2.5 MG tablet Take 6 tablets by mouth every 7 (seven) days.  . rosuvastatin (CRESTOR) 10 MG tablet TAKE 1 TABLET BY MOUTH DAILY  . [DISCONTINUED] folic acid (FOLVITE) 1 MG tablet TAKE 1 TABLET BY MOUTH ONCE DAILY  . predniSONE (DELTASONE) 5 MG tablet TAKE 4 TABLETS BY MOUTH DAILY FOR 7 DAYS, 3 TABLETS DAILY FOR 7 DAYS, 2 TABLETS  FOR 7 DAYS, THEN 1 TABLET FOR 7 DAYS (Patient not taking: Reported on 01/27/2021)  . predniSONE (DELTASONE) 5 MG tablet Take 1 tablet (5 mg total) by mouth once daily (Patient not taking: Reported on 01/27/2021)  . [DISCONTINUED] COVID-19 mRNA Vac-TriS, Pfizer, (PFIZER-BIONT COVID-19 VAC-TRIS) SUSP injection Inject into the muscle. (Patient not taking: Reported on 01/27/2021)  . [DISCONTINUED] gabapentin (NEURONTIN) 300 MG capsule TAKE 2 CAPSULES BY MOUTH EVERY EVENING (Patient not taking: Reported on 01/27/2021)  . [DISCONTINUED] hydroxychloroquine (PLAQUENIL) 200 MG tablet One tab twice a day for inflammatory arthritis, 30 days, 60 tabs (Patient not taking: Reported on 01/27/2021)  . [DISCONTINUED] meloxicam (MOBIC) 7.5 MG tablet Take 1 tablet (7.5 mg total) by mouth daily. (Patient not taking: Reported on 01/27/2021)  . [DISCONTINUED] methotrexate (RHEUMATREX) 2.5 MG tablet Take 4 tablets (10 mg total) by mouth every 7 (seven) days All on the same day (Patient not taking: Reported on 01/27/2021)  . [DISCONTINUED] methotrexate 2.5 MG tablet TAKE 4 TABLETS (10 MG TOTAL) BY MOUTH EVERY 7 (SEVEN) DAYS ALL ON THE SAME DAY (Patient not taking: Reported on 01/27/2021)  . [DISCONTINUED] methotrexate 2.5 MG tablet TAKE 4 TABLETS (10 MG) BY MOUTH EVERY 7 DAYS ALL ON THE SAME DAY (Patient not taking: Reported on 01/27/2021)  . [DISCONTINUED] methotrexate 2.5 MG tablet TAKE 6 TABLETS (15 MG TOTAL) BY MOUTH EVERY 7 DAYS ALL ON THE SAME DAY (Patient not taking: Reported on 01/27/2021)  . [DISCONTINUED] predniSONE (DELTASONE) 5 MG tablet Take 5 mg by mouth daily. (Patient not taking: Reported on 01/27/2021)  . [DISCONTINUED] predniSONE (DELTASONE) 5 MG tablet TAKE 1 TABLET BY MOUTH ONCE DAILY (Patient not taking: Reported on 01/27/2021)  . [DISCONTINUED] predniSONE (DELTASONE) 5 MG tablet TAKE 1 TABLET BY MOUTH ONCE DAILY (Patient not taking: Reported on 01/27/2021)  . [DISCONTINUED] traZODone (DESYREL) 50 MG tablet TAKE 1/2 TO  1 TABLET BY MOUTH AT BEDTIME AS NEEDED FOR SLEEP. (Patient not taking: Reported on 01/27/2021)  . [EXPIRED] cyanocobalamin ((VITAMIN B-12)) injection 1,000 mcg    No facility-administered encounter medications on file as of 01/27/2021.    Review of Systems  Constitutional: Negative for appetite change and unexpected weight change.  HENT: Negative for congestion, sinus pressure and sore throat.   Eyes: Negative for pain and visual disturbance.  Respiratory: Negative for cough, chest tightness and shortness of breath.   Cardiovascular: Negative for chest pain, palpitations and leg swelling.  Gastrointestinal: Negative for abdominal pain, diarrhea, nausea and vomiting.  Genitourinary: Negative for difficulty urinating and dysuria.  Musculoskeletal:  Negative for joint swelling and myalgias.  Skin: Negative for color change and rash.  Neurological: Negative for dizziness, light-headedness and headaches.  Hematological: Negative for adenopathy. Does not bruise/bleed easily.  Psychiatric/Behavioral: Negative for agitation and dysphoric mood.       Objective:    Physical Exam Vitals reviewed.  Constitutional:      General: She is not in acute distress.    Appearance: Normal appearance. She is well-developed.  HENT:     Head: Normocephalic and atraumatic.     Right Ear: External ear normal.     Left Ear: External ear normal.  Eyes:     General: No scleral icterus.       Right eye: No discharge.        Left eye: No discharge.     Conjunctiva/sclera: Conjunctivae normal.  Neck:     Thyroid: No thyromegaly.  Cardiovascular:     Rate and Rhythm: Normal rate and regular rhythm.  Pulmonary:     Effort: No tachypnea, accessory muscle usage or respiratory distress.     Breath sounds: Normal breath sounds. No decreased breath sounds or wheezing.  Chest:  Breasts:     Right: No inverted nipple, mass, nipple discharge or tenderness (no axillary adenopathy).     Left: No inverted nipple,  mass, nipple discharge or tenderness (no axilarry adenopathy).    Abdominal:     General: Bowel sounds are normal.     Palpations: Abdomen is soft.     Tenderness: There is no abdominal tenderness.  Musculoskeletal:        General: No swelling or tenderness.     Cervical back: Neck supple. No tenderness.  Lymphadenopathy:     Cervical: No cervical adenopathy.  Skin:    Findings: No erythema or rash.  Neurological:     Mental Status: She is alert and oriented to person, place, and time.  Psychiatric:        Mood and Affect: Mood normal.        Behavior: Behavior normal.     BP 116/68 (BP Location: Left Arm, Patient Position: Sitting, Cuff Size: Large)   Pulse 88   Temp 97.8 F (36.6 C) (Oral)   Ht 5\' 3"  (1.6 m)   Wt 214 lb 12.8 oz (97.4 kg)   SpO2 98%   BMI 38.05 kg/m  Wt Readings from Last 3 Encounters:  01/27/21 214 lb 12.8 oz (97.4 kg)  12/04/20 216 lb (98 kg)  10/31/20 216 lb (98 kg)     Lab Results  Component Value Date   WBC 6.7 10/02/2020   HGB 14.5 10/02/2020   HCT 42.3 10/02/2020   PLT 304.0 10/02/2020   GLUCOSE 77 10/02/2020   CHOL 147 10/02/2020   TRIG 77.0 10/02/2020   HDL 55.30 10/02/2020   LDLDIRECT 87.0 04/03/2019   LDLCALC 76 10/02/2020   ALT 13 10/02/2020   AST 16 10/02/2020   NA 138 10/02/2020   K 4.5 10/02/2020   CL 104 10/02/2020   CREATININE 0.94 10/02/2020   BUN 9 10/02/2020   CO2 29 10/02/2020   TSH 1.80 07/01/2020    DG Bone Density  Result Date: 11/25/2020 EXAM: DUAL X-RAY ABSORPTIOMETRY (DXA) FOR BONE MINERAL DENSITY IMPRESSION: Your patient Sevaeh Maynes completed a BMD test on 11/25/2020 using the [place] (software version: 14.10) manufactured by [employer]. The following summarizes the results of our evaluation. Technologist:VLM PATIENT BIOGRAPHICAL: Name: Lindita, Junghans Patient ID: [id number] Birth Date: 09-01-1971  Height: 62.0 in. Gender: Female Exam Date: 11/25/2020 Weight: 206.0 lbs.  Indications: Caucasian, Height Loss, Postmenopausal, Rheumatoid Arthritis Fractures: Foot Treatments: Methotrexate, Prednisone DENSITOMETRY RESULTS: Site      Region    Measured Date Measured Age WHO Classification Young Adult T-score BMD         %Change vs. Previous Significant Change (*) AP Spine L1-L4 11/25/2020 49.7 Normal 0.9 1.304 g/cm2 - - DualFemur Neck Left 11/25/2020 49.7 Normal -0.2 1.004 g/cm2 - - ASSESSMENT: The BMD measured at Femur Neck Left is 1.004 g/cm2 with a T-score of -0.2. This patient is considered normal according to [facility]) criteria. The scan quality is good. World Pharmacologist [facility] criteria for post-menopausal, Caucasian Women: Normal:                   T-score at or above -1 SD Osteopenia/low bone mass: T-score between -1 and -2.5 SD Osteoporosis:             T-score at or below -2.5 SD RECOMMENDATIONS: 1. All patients should optimize calcium and vitamin D intake. 2. Consider FDA-approved medical therapies in postmenopausal women and men aged 82 years and older, based on the following: a. A hip or vertebral(clinical or morphometric) fracture b. T-score < -2.5 at the femoral neck or spine after appropriate evaluation to exclude secondary causes c. Low bone mass (T-score between -1.0 and -2.5 at the femoral neck or spine) and a 10-year probability of a hip fracture > 3% or a 10-year probability of a major osteoporosis-related fracture > 20% based on the [place]-adapted WHO algorithm 3. Clinician judgment and/or patient preferences may indicate treatment for people with 10-year fracture probabilities above or below these levels FOLLOW-UP: People with diagnosed cases of osteoporosis or at high risk for fracture should have regular bone mineral density tests. For patients eligible for Medicare, routine testing is allowed once every 2 years. The testing frequency can be increased to one year for patients who have rapidly progressing disease, those who are receiving or  discontinuing medical therapy to restore bone mass, or have additional risk factors. I have reviewed this report, and agree with the above findings. [facility] Electronically Signed   By: Lowella Grip III M.D.   On: 11/25/2020 14:57       Assessment & Plan:   Problem List Items Addressed This Visit    Anemia    Follow cbc.       Hypercholesterolemia    Continue crestor.  Low cholesterol diet and exercise.  Follow lipid panel and liver function tests.        Relevant Orders   Hepatic function panel   Lipid panel   Rheumatoid arthritis ([place])    Followed by Dr Jefm Bryant.  Recently started cimzia.  Doing better.  Follow.       Stress    On wellbutrin.  Appears to be doing well.  Follow.       Thrombocytosis    Platelet count 06/2020 wnl.  Follow cbc.        Other Visit Diagnoses    B12 deficiency    -  Primary   Relevant Medications   cyanocobalamin ((VITAMIN B-12)) injection 1,000 mcg (Completed)   Essential (hemorrhagic) thrombocythemia ([facility])   (Chronic)     Relevant Orders   Basic metabolic panel       Einar Pheasant, MD

## 2021-02-01 ENCOUNTER — Encounter: Payer: Self-pay | Admitting: Internal Medicine

## 2021-02-01 NOTE — Assessment & Plan Note (Signed)
Platelet count 06/2020 wnl.  Follow cbc.

## 2021-02-01 NOTE — Assessment & Plan Note (Signed)
On wellbutrin.  Appears to be doing well.  Follow.

## 2021-02-01 NOTE — Assessment & Plan Note (Signed)
Follow cbc.  

## 2021-02-01 NOTE — Assessment & Plan Note (Signed)
Followed by Dr Jefm Bryant.  Recently started cimzia.  Doing better.  Follow.

## 2021-02-01 NOTE — Assessment & Plan Note (Signed)
Continue crestor.  Low cholesterol diet and exercise. Follow lipid panel and liver function tests.   

## 2021-02-02 ENCOUNTER — Other Ambulatory Visit: Payer: Self-pay

## 2021-02-02 MED FILL — Gabapentin Cap 300 MG: ORAL | 30 days supply | Qty: 90 | Fill #1 | Status: AC

## 2021-02-05 ENCOUNTER — Encounter: Payer: Self-pay | Admitting: Internal Medicine

## 2021-02-06 NOTE — Telephone Encounter (Signed)
Let me know if she wants/needs to be seen. Needs to keep [place] posted on how she is doing.

## 2021-02-07 ENCOUNTER — Encounter: Payer: Self-pay | Admitting: Internal Medicine

## 2021-02-08 ENCOUNTER — Encounter: Payer: Self-pay | Admitting: Internal Medicine

## 2021-02-09 ENCOUNTER — Telehealth (INDEPENDENT_AMBULATORY_CARE_PROVIDER_SITE_OTHER): Payer: 59 | Admitting: Internal Medicine

## 2021-02-09 ENCOUNTER — Other Ambulatory Visit: Payer: Self-pay

## 2021-02-09 ENCOUNTER — Encounter: Payer: Self-pay | Admitting: Internal Medicine

## 2021-02-09 DIAGNOSIS — U071 COVID-19: Secondary | ICD-10-CM

## 2021-02-09 DIAGNOSIS — J329 Chronic sinusitis, unspecified: Secondary | ICD-10-CM

## 2021-02-09 DIAGNOSIS — M069 Rheumatoid arthritis, unspecified: Secondary | ICD-10-CM | POA: Diagnosis not present

## 2021-02-09 MED ORDER — AMOXICILLIN 875 MG PO TABS
875.0000 mg | ORAL_TABLET | Freq: Two times a day (BID) | ORAL | 0 refills | Status: DC
  Filled 2021-02-09: qty 20, 10d supply, fill #0

## 2021-02-09 NOTE — Telephone Encounter (Signed)
Appt scheduled with Dr Scott  

## 2021-02-09 NOTE — Progress Notes (Signed)
[name], female   DOB: 1970/10/19, 50 y.o.   MRN: [id number]   Virtual Visit via video Note  This visit type was conducted due to national recommendations for restrictions regarding the COVID-19 pandemic (e.g. social distancing).  This format is felt to be most appropriate for this patient at this time.  All issues noted in this document were discussed and addressed.  No physical exam was performed (except for noted visual exam findings with Video Visits).   I connected with Yeilin Zweber by a video enabled telemedicine application and verified that I am speaking with the correct person using two identifiers. Location patient: home Location provider: work or Persons participating in the virtual visit: patient, provider  The limitations, risks, security and privacy concerns of performing an evaluation and management service by video and the availability of in person appointments have been discussed.  It has also been discussed with the patient that there may be a patient responsible charge related to this service. The patient expressed understanding and agreed to proceed.   Reason for visit: work in appt.   HPI: Work in:  covid positive.  Symptoms started 01/31/21.  Tested positive 02/04/21.  Started with bad sore throat. Minimal cough.  Took delsym. Cough is better.  No fever.  No chest pain, chest tightness or sob.  Decreased energy.  Was feeling some better the end of the week.  Over the last couple of days has developed increased sinus pressure and sinus headache.  Increased nasal/head congestion.  Occasionally productive of colored mucus.  No sore throat now.  No chest congestion or cough now.  No nausea or vomiting.  No abdominal pain.  Bowels moving.  No diarrhea.  No earache.  Increased post nasal drainage.  Using flonase and saline nasal spray.     ROS: See pertinent positives and negatives per HPI.  Past Medical History:  Diagnosis Date  . Allergy   . Blood in stool     H/O  . History of chicken pox   . Hx: UTI (urinary tract infection)   . Hyperlipidemia     Past Surgical History:  Procedure Laterality Date  . AUGMENTATION MAMMAPLASTY  2001  . CESAREAN SECTION  2007 and 2009  . tummy tuck  2001    Family History  Problem Relation Age of Onset  . Healthy Mother   . Hypertension Mother   . Cancer Father        non hogkins lymphoma  . Atrial fibrillation Father   . Heart disease Father   . Hypertension Father   . Arthritis Sister   . Asthma Sister   . Hypertension Sister   . Healthy Daughter   . Healthy Son   . Alzheimer's disease Maternal Grandmother   . Healthy Sister   . Arthritis Maternal Grandfather   . Colon cancer Paternal Uncle 79  . Breast cancer Neg Hx     SOCIAL HX: reviewed.    Current Outpatient Medications:  .  acetaminophen (TYLENOL) 500 MG tablet, Take 500 mg by mouth every 6 (six) hours as needed., Disp: , Rfl:  .  ALPRAZolam (XANAX) 0.25 MG tablet, TAKE 1 TABLET BY MOUTH ONCE DAILY AS NEEDED FOR ANXIETY, Disp: 30 tablet, Rfl: 0 .  buPROPion (WELLBUTRIN SR) 150 MG 12 hr tablet, TAKE 1 TABLET BY MOUTH TWICE DAILY, Disp: 60 tablet, Rfl: 1 .  Certolizumab Pegol 2 X 200 MG/ML PSKT, Inject into the skin every 30 (thirty) days., Disp: , Rfl:  .  cetirizine (ZYRTEC) 10 MG tablet, Take 10 mg by mouth daily., Disp: , Rfl:  .  famotidine (PEPCID) 20 MG tablet, Take 20 mg by mouth 2 (two) times daily., Disp: , Rfl:  .  ferrous sulfate 325 (65 FE) MG tablet, Take 325 mg by mouth daily with breakfast., Disp: , Rfl:  .  fluticasone (FLONASE) 50 MCG/ACT nasal spray, USE 2 SPRAYS IN EACH NOSTRIL ONCE DAILY, Disp: 16 g, Rfl: 2 .  folic acid (FOLVITE) 1 MG tablet, Take 1 mg by mouth daily., Disp: , Rfl:  .  gabapentin (NEURONTIN) 300 MG capsule, TAKE 3 CAPSULES BY MOUTH EVERY EVENING, Disp: 90 capsule, Rfl: 1 .  hydroxychloroquine (PLAQUENIL) 200 MG tablet, TAKE 1 TABLET BY MOUTH TWICE DAILY FOR INFLAMMATORY ARTHRITIS, Disp: 180  tablet, Rfl: 1 .  Melatonin 10 MG TABS, Take 10 mg by mouth at bedtime., Disp: , Rfl:  .  meloxicam (MOBIC) 7.5 MG tablet, TAKE 1 TABLET BY MOUTH TWICE DAILY WITH MEALS, Disp: 60 tablet, Rfl: 3 .  methotrexate (RHEUMATREX) 2.5 MG tablet, Take 6 tablets by mouth every 7 (seven) days., Disp: , Rfl:  .  rosuvastatin (CRESTOR) 10 MG tablet, TAKE 1 TABLET BY MOUTH DAILY, Disp: 90 tablet, Rfl: 2 .  cefdinir (OMNICEF) 300 MG capsule, Take 1 capsule (300 mg total) by mouth 2 (two) times daily., Disp: 14 capsule, Rfl: 0 .  predniSONE (DELTASONE) 10 MG tablet, Take 4 tablets x 1 day and then decrease by 1/2 tablet per day until down to zero mg., Disp: 18 tablet, Rfl: 0  EXAM:  GENERAL: alert, oriented, appears well and in no acute distress  HEENT: atraumatic, conjunttiva clear, no obvious abnormalities on inspection of external nose and ears  NECK: normal movements of the head and neck  LUNGS: on inspection no signs of respiratory distress, breathing rate appears normal, no obvious gross SOB, gasping or wheezing  CV: no obvious cyanosis  PSYCH/NEURO: pleasant and cooperative, no obvious depression or anxiety, speech and thought processing grossly intact  ASSESSMENT AND PLAN:  Discussed the following assessment and plan:  Problem List Items Addressed This Visit    COVID-19 virus infection    Recent infection.  Doing better initially and now with sinus symptoms as outlined.  No sob.  No chest pain.  Treat for sinus infection.  Follow symptoms.  Call with update.  covid symptoms started 01/31/21.  Out of window for outpt treatment - oral anti virals, etc.       Rheumatoid arthritis ([place])    Followed by Dr Jefm Bryant.  On cimzia.  Overall has done well on this medication.  Follow.       Sinusitis    Recently diagnosed with covid as outlined.  Was better, now with increased sinus pressure and congestion as outlined.  Feels like sinus infection.  Discussed treatment.  Continue saline nasal spray and  steroid nasal spray.  Delsym for cough.  Add amoxicillin 875mg  bid.  Follow.  Call with update.          Return if symptoms worsen or fail to improve.   I discussed the assessment and treatment plan with the patient. The patient was provided an opportunity to ask questions and all were answered. The patient agreed with the plan and demonstrated an understanding of the instructions.   The patient was advised to call back or seek an in-person evaluation if the symptoms worsen or if the condition fails to improve as anticipated.    Einar Pheasant, MD

## 2021-02-12 ENCOUNTER — Encounter: Payer: Self-pay | Admitting: Internal Medicine

## 2021-02-12 NOTE — Telephone Encounter (Signed)
Is there something else that you can recommend for her to help with the headache?

## 2021-02-12 NOTE — Telephone Encounter (Signed)
See other message as well

## 2021-02-12 NOTE — Telephone Encounter (Signed)
If she feels her headache is related to sinus congestion - can add afrin nasal srpay - 2 sprays each nostril two times per day for 3 days.  If increasing headache - worsening headache - needs to be seen.

## 2021-02-12 NOTE — Telephone Encounter (Signed)
See attached

## 2021-02-12 NOTE — Telephone Encounter (Signed)
Pt is aware and will try afrin and let me know if afrin does not help

## 2021-02-13 ENCOUNTER — Encounter: Payer: Self-pay | Admitting: Internal Medicine

## 2021-02-13 ENCOUNTER — Other Ambulatory Visit: Payer: Self-pay

## 2021-02-13 MED ORDER — CEFDINIR 300 MG PO CAPS
300.0000 mg | ORAL_CAPSULE | Freq: Two times a day (BID) | ORAL | 0 refills | Status: DC
  Filled 2021-02-13: qty 14, 7d supply, fill #0

## 2021-02-13 MED ORDER — PREDNISONE 10 MG PO TABS
ORAL_TABLET | ORAL | 0 refills | Status: DC
  Filled 2021-02-13: qty 18, 8d supply, fill #0

## 2021-02-13 NOTE — Telephone Encounter (Signed)
Patient aware of below. Antibiotic sent in to pharmacy. Patient is agreeable to prednsone. She is already taking probiotic.

## 2021-02-13 NOTE — Telephone Encounter (Signed)
I can change her to a different abx - omnicef 300mg  bid x 1 week, but given symptoms, may need a short prednisone taper.  I am not sure if she would be agreeable to this.  Can let me know.   Needs to take probiotics while on abx and for two weeks after.

## 2021-02-13 NOTE — Telephone Encounter (Signed)
rx sent in for prednisone.

## 2021-02-16 ENCOUNTER — Encounter: Payer: Self-pay | Admitting: Internal Medicine

## 2021-02-16 DIAGNOSIS — U071 COVID-19: Secondary | ICD-10-CM | POA: Insufficient documentation

## 2021-02-16 MED FILL — Meloxicam Tab 7.5 MG: ORAL | 30 days supply | Qty: 60 | Fill #1 | Status: AC

## 2021-02-16 NOTE — Assessment & Plan Note (Signed)
Recently diagnosed with covid as outlined.  Was better, now with increased sinus pressure and congestion as outlined.  Feels like sinus infection.  Discussed treatment.  Continue saline nasal spray and steroid nasal spray.  Delsym for cough.  Add amoxicillin 875mg  bid.  Follow.  Call with update.

## 2021-02-16 NOTE — Assessment & Plan Note (Signed)
Followed by Dr Jefm Bryant.  On cimzia.  Overall has done well on this medication.  Follow.

## 2021-02-16 NOTE — Assessment & Plan Note (Signed)
Recent infection.  Doing better initially and now with sinus symptoms as outlined.  No sob.  No chest pain.  Treat for sinus infection.  Follow symptoms.  Call with update.  covid symptoms started 01/31/21.  Out of window for outpt treatment - oral anti virals, etc.

## 2021-02-17 ENCOUNTER — Other Ambulatory Visit: Payer: Self-pay

## 2021-02-20 ENCOUNTER — Telehealth: Payer: Self-pay | Admitting: Internal Medicine

## 2021-02-20 NOTE — Telephone Encounter (Signed)
Patient called and wanted to know why Tamara Shah 30-Jan-2021 appointment was not coded as a physical. Patient stated she was suppose to have a physical on 10/31/20 put had to be changed to a office visit because of issues at that time. Patient states she remembers disrobing to have a physical on 30-Jan-2021 with Tamara Shah. There is no documenting of a physical that day. 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 Please advise.

## 2021-02-23 ENCOUNTER — Other Ambulatory Visit: Payer: Self-pay

## 2021-02-23 MED FILL — Folic Acid Tab 1 MG: ORAL | 30 days supply | Qty: 30 | Fill #2 | Status: AC

## 2021-02-23 NOTE — Telephone Encounter (Signed)
Per Dr Bary Leriche last note, she had a physical on 02-17-2023. Can we confirm it was coded correctly?

## 2021-02-23 NOTE — Telephone Encounter (Signed)
E-mailed coders for review.

## 2021-02-24 ENCOUNTER — Telehealth: Payer: 59

## 2021-02-24 DIAGNOSIS — M0609 Rheumatoid arthritis without rheumatoid factor, multiple sites: Secondary | ICD-10-CM | POA: Diagnosis not present

## 2021-03-03 ENCOUNTER — Other Ambulatory Visit: Payer: Self-pay

## 2021-03-03 ENCOUNTER — Other Ambulatory Visit: Payer: Self-pay | Admitting: Internal Medicine

## 2021-03-03 ENCOUNTER — Ambulatory Visit (INDEPENDENT_AMBULATORY_CARE_PROVIDER_SITE_OTHER): Payer: 59

## 2021-03-03 DIAGNOSIS — E538 Deficiency of other specified B group vitamins: Secondary | ICD-10-CM

## 2021-03-03 MED ORDER — CYANOCOBALAMIN 1000 MCG/ML IJ SOLN
1000.0000 ug | Freq: Once | INTRAMUSCULAR | Status: AC
  Administered 2021-03-03: 1000 ug via INTRAMUSCULAR

## 2021-03-03 MED FILL — Gabapentin Cap 300 MG: ORAL | 30 days supply | Qty: 90 | Fill #0 | Status: AC

## 2021-03-03 MED FILL — Fluticasone Propionate Nasal Susp 50 MCG/ACT: NASAL | 30 days supply | Qty: 16 | Fill #0 | Status: AC

## 2021-03-03 NOTE — Progress Notes (Signed)
Patient presented for B 12 injection to left deltoid, patient voiced no concerns nor showed any signs of distress during injection. 

## 2021-03-04 ENCOUNTER — Other Ambulatory Visit: Payer: Self-pay

## 2021-03-16 ENCOUNTER — Other Ambulatory Visit: Payer: Self-pay

## 2021-03-16 ENCOUNTER — Other Ambulatory Visit: Payer: Self-pay | Admitting: Internal Medicine

## 2021-03-16 MED FILL — Meloxicam Tab 7.5 MG: ORAL | 30 days supply | Qty: 60 | Fill #2 | Status: AC

## 2021-03-16 MED FILL — Rosuvastatin Calcium Tab 10 MG: ORAL | 90 days supply | Qty: 90 | Fill #0 | Status: AC

## 2021-03-16 NOTE — Telephone Encounter (Signed)
Please advise, asking for increase to 2 pills daily

## 2021-03-17 ENCOUNTER — Other Ambulatory Visit: Payer: Self-pay

## 2021-03-17 MED ORDER — BUPROPION HCL ER (SR) 150 MG PO TB12
ORAL_TABLET | Freq: Two times a day (BID) | ORAL | 1 refills | Status: DC
  Filled 2021-03-17: qty 60, 30d supply, fill #0
  Filled 2021-04-13: qty 60, 30d supply, fill #1

## 2021-03-23 MED FILL — Folic Acid Tab 1 MG: ORAL | 30 days supply | Qty: 30 | Fill #3 | Status: AC

## 2021-03-24 ENCOUNTER — Telehealth (INDEPENDENT_AMBULATORY_CARE_PROVIDER_SITE_OTHER): Payer: 59 | Admitting: Gastroenterology

## 2021-03-24 ENCOUNTER — Other Ambulatory Visit: Payer: Self-pay

## 2021-03-24 DIAGNOSIS — Z1211 Encounter for screening for malignant neoplasm of colon: Secondary | ICD-10-CM

## 2021-03-24 DIAGNOSIS — M0609 Rheumatoid arthritis without rheumatoid factor, multiple sites: Secondary | ICD-10-CM | POA: Diagnosis not present

## 2021-03-24 MED ORDER — NA SULFATE-K SULFATE-MG SULF 17.5-3.13-1.6 GM/177ML PO SOLN
1.0000 | Freq: Once | ORAL | 0 refills | Status: AC
  Filled 2021-03-24 – 2021-06-16 (×2): qty 354, 1d supply, fill #0

## 2021-03-24 NOTE — Progress Notes (Signed)
Gastroenterology Pre-Procedure Review  Request Date: 06/19/2021 Requesting Physician: Dr. Bonna Gains   PATIENT REVIEW QUESTIONS: The patient responded to the following health history questions as indicated:    1. Are you having any GI issues? no 2. Do you have a personal history of Polyps?  N/A 3. Do you have a family history of Colon Cancer or Polyps? yes (Uncle on Father side had Colon Cancer; Father had colon polyps; Sister had polyps ) 4. Diabetes Mellitus? no 5. Joint replacements in the past 12 months?no 6. Major health problems in the past 3 months?no 7. Any artificial heart valves, MVP, or defibrillator?no    MEDICATIONS & ALLERGIES:    Patient reports the following regarding taking any anticoagulation/antiplatelet therapy:   Plavix, Coumadin, Eliquis, Xarelto, Lovenox, Pradaxa, Brilinta, or Effient? no Aspirin? no  Patient confirms/reports the following medications:  Current Outpatient Medications  Medication Sig Dispense Refill   acetaminophen (TYLENOL) 500 MG tablet Take 500 mg by mouth every 6 (six) hours as needed.     buPROPion (WELLBUTRIN SR) 150 MG 12 hr tablet Take by mouth 2 (two) times daily. 60 tablet 1   cefdinir (OMNICEF) 300 MG capsule Take 1 capsule (300 mg total) by mouth 2 (two) times daily. 14 capsule 0   Certolizumab Pegol 2 X 200 MG/ML PSKT Inject into the skin every 30 (thirty) days.     cetirizine (ZYRTEC) 10 MG tablet Take 10 mg by mouth daily.     famotidine (PEPCID) 20 MG tablet Take 20 mg by mouth 2 (two) times daily.     ferrous sulfate 325 (65 FE) MG tablet Take 325 mg by mouth daily with breakfast.     fluticasone (FLONASE) 50 MCG/ACT nasal spray USE 2 SPRAYS IN EACH NOSTRIL ONCE DAILY 16 g 2   folic acid (FOLVITE) 1 MG tablet Take 1 mg by mouth daily.     folic acid (FOLVITE) 1 MG tablet TAKE 1 TABLET BY MOUTH ONCE DAILY 30 tablet 11   gabapentin (NEURONTIN) 300 MG capsule TAKE 3 CAPSULES BY MOUTH EVERY EVENING 90 capsule 1   hydroxychloroquine  (PLAQUENIL) 200 MG tablet TAKE 1 TABLET BY MOUTH TWICE DAILY FOR INFLAMMATORY ARTHRITIS 180 tablet 1   Melatonin 10 MG TABS Take 10 mg by mouth at bedtime.     meloxicam (MOBIC) 7.5 MG tablet TAKE 1 TABLET BY MOUTH TWICE DAILY WITH MEALS 60 tablet 3   methotrexate (RHEUMATREX) 2.5 MG tablet Take 6 tablets by mouth every 7 (seven) days.     predniSONE (DELTASONE) 10 MG tablet Take 4 tablets x 1 day and then decrease by 1/2 tablet per day until down to zero mg. 18 tablet 0   rosuvastatin (CRESTOR) 10 MG tablet TAKE 1 TABLET BY MOUTH DAILY 90 tablet 2   No current facility-administered medications for this visit.    Patient confirms/reports the following allergies:  No Known Allergies  No orders of the defined types were placed in this encounter.   AUTHORIZATION INFORMATION Primary Insurance: 1D#: Group #:  Secondary Insurance: 1D#: Group #:  SCHEDULE INFORMATION: Date: 06/19/2021 Time: Location: [place]

## 2021-04-02 ENCOUNTER — Other Ambulatory Visit: Payer: Self-pay

## 2021-04-02 ENCOUNTER — Ambulatory Visit (INDEPENDENT_AMBULATORY_CARE_PROVIDER_SITE_OTHER): Payer: 59

## 2021-04-02 DIAGNOSIS — E538 Deficiency of other specified B group vitamins: Secondary | ICD-10-CM | POA: Diagnosis not present

## 2021-04-02 MED ORDER — CYANOCOBALAMIN 1000 MCG/ML IJ SOLN
1000.0000 ug | Freq: Once | INTRAMUSCULAR | Status: AC
  Administered 2021-04-02: 1000 ug via INTRAMUSCULAR

## 2021-04-02 NOTE — Progress Notes (Signed)
Patient presented for B 12 injection to left deltoid, patient voiced no concerns nor showed any signs of distress during injection. 

## 2021-04-06 ENCOUNTER — Other Ambulatory Visit: Payer: Self-pay

## 2021-04-06 MED FILL — Fluticasone Propionate Nasal Susp 50 MCG/ACT: NASAL | 30 days supply | Qty: 16 | Fill #1 | Status: AC

## 2021-04-06 MED FILL — Gabapentin Cap 300 MG: ORAL | 30 days supply | Qty: 90 | Fill #1 | Status: AC

## 2021-04-07 ENCOUNTER — Other Ambulatory Visit: Payer: Self-pay

## 2021-04-13 ENCOUNTER — Other Ambulatory Visit: Payer: Self-pay

## 2021-04-13 ENCOUNTER — Other Ambulatory Visit: Payer: Self-pay | Admitting: Internal Medicine

## 2021-04-13 MED ORDER — MELOXICAM 7.5 MG PO TABS
7.5000 mg | ORAL_TABLET | Freq: Two times a day (BID) | ORAL | 3 refills | Status: DC
  Filled 2021-04-13: qty 60, 30d supply, fill #0
  Filled 2021-05-17: qty 60, 30d supply, fill #1
  Filled 2021-06-15 (×2): qty 60, 30d supply, fill #2
  Filled 2021-07-21: qty 60, 30d supply, fill #3

## 2021-04-14 ENCOUNTER — Other Ambulatory Visit: Payer: Self-pay

## 2021-04-14 MED FILL — Alprazolam Tab 0.25 MG: ORAL | 30 days supply | Qty: 30 | Fill #0 | Status: AC

## 2021-04-15 ENCOUNTER — Other Ambulatory Visit: Payer: Self-pay

## 2021-04-20 ENCOUNTER — Other Ambulatory Visit: Payer: Self-pay

## 2021-04-20 MED FILL — Folic Acid Tab 1 MG: ORAL | 30 days supply | Qty: 30 | Fill #4 | Status: AC

## 2021-04-22 DIAGNOSIS — M159 Polyosteoarthritis, unspecified: Secondary | ICD-10-CM | POA: Diagnosis not present

## 2021-04-22 DIAGNOSIS — Z79899 Other long term (current) drug therapy: Secondary | ICD-10-CM | POA: Diagnosis not present

## 2021-04-22 DIAGNOSIS — M0609 Rheumatoid arthritis without rheumatoid factor, multiple sites: Secondary | ICD-10-CM | POA: Diagnosis not present

## 2021-05-04 ENCOUNTER — Other Ambulatory Visit: Payer: Self-pay

## 2021-05-04 ENCOUNTER — Other Ambulatory Visit: Payer: Self-pay | Admitting: Internal Medicine

## 2021-05-04 MED ORDER — METHOTREXATE 2.5 MG PO TABS
ORAL_TABLET | ORAL | 1 refills | Status: DC
  Filled 2021-05-04: qty 48, 84d supply, fill #0

## 2021-05-05 ENCOUNTER — Other Ambulatory Visit: Payer: Self-pay

## 2021-05-05 ENCOUNTER — Ambulatory Visit (INDEPENDENT_AMBULATORY_CARE_PROVIDER_SITE_OTHER): Payer: 59

## 2021-05-05 ENCOUNTER — Other Ambulatory Visit: Payer: Self-pay | Admitting: Internal Medicine

## 2021-05-05 DIAGNOSIS — E538 Deficiency of other specified B group vitamins: Secondary | ICD-10-CM | POA: Diagnosis not present

## 2021-05-05 MED ORDER — CYANOCOBALAMIN 1000 MCG/ML IJ SOLN
1000.0000 ug | INTRAMUSCULAR | Status: DC
  Administered 2021-05-05 – 2021-06-04 (×2): 1000 ug via INTRAMUSCULAR

## 2021-05-05 NOTE — Progress Notes (Addendum)
Patient presenting for monthly b12 injection. Given in the right deltoid. Patient tolerated well.   Reviewed.  Dr Nicki Reaper

## 2021-05-06 ENCOUNTER — Other Ambulatory Visit: Payer: Self-pay

## 2021-05-06 MED FILL — Gabapentin Cap 300 MG: ORAL | 30 days supply | Qty: 90 | Fill #0 | Status: AC

## 2021-05-13 ENCOUNTER — Other Ambulatory Visit: Payer: Self-pay

## 2021-05-13 MED FILL — Fluticasone Propionate Nasal Susp 50 MCG/ACT: NASAL | 30 days supply | Qty: 16 | Fill #2 | Status: AC

## 2021-05-17 ENCOUNTER — Other Ambulatory Visit: Payer: Self-pay

## 2021-05-17 ENCOUNTER — Other Ambulatory Visit: Payer: Self-pay | Admitting: Internal Medicine

## 2021-05-18 ENCOUNTER — Other Ambulatory Visit: Payer: Self-pay

## 2021-05-18 MED ORDER — BUPROPION HCL ER (SR) 150 MG PO TB12
150.0000 mg | ORAL_TABLET | Freq: Two times a day (BID) | ORAL | 1 refills | Status: DC
  Filled 2021-05-18: qty 180, 90d supply, fill #0
  Filled 2021-08-16: qty 180, 90d supply, fill #1

## 2021-05-18 MED ORDER — FOLIC ACID 1 MG PO TABS
ORAL_TABLET | ORAL | 11 refills | Status: DC
  Filled 2021-05-18: qty 30, 30d supply, fill #0
  Filled 2021-06-22: qty 30, 30d supply, fill #1
  Filled 2021-07-20: qty 30, 30d supply, fill #2
  Filled 2021-08-16: qty 30, 30d supply, fill #3

## 2021-06-02 ENCOUNTER — Other Ambulatory Visit: Payer: 59

## 2021-06-02 ENCOUNTER — Other Ambulatory Visit (INDEPENDENT_AMBULATORY_CARE_PROVIDER_SITE_OTHER): Payer: 59

## 2021-06-02 ENCOUNTER — Other Ambulatory Visit: Payer: Self-pay

## 2021-06-02 DIAGNOSIS — H5213 Myopia, bilateral: Secondary | ICD-10-CM | POA: Diagnosis not present

## 2021-06-02 DIAGNOSIS — E7800 Pure hypercholesterolemia, unspecified: Secondary | ICD-10-CM

## 2021-06-02 DIAGNOSIS — H43392 Other vitreous opacities, left eye: Secondary | ICD-10-CM | POA: Diagnosis not present

## 2021-06-02 DIAGNOSIS — Z135 Encounter for screening for eye and ear disorders: Secondary | ICD-10-CM | POA: Diagnosis not present

## 2021-06-02 DIAGNOSIS — D473 Essential (hemorrhagic) thrombocythemia: Secondary | ICD-10-CM | POA: Diagnosis not present

## 2021-06-02 DIAGNOSIS — Z79899 Other long term (current) drug therapy: Secondary | ICD-10-CM | POA: Diagnosis not present

## 2021-06-02 LAB — LIPID PANEL
Cholesterol: 160 mg/dL (ref 0–200)
HDL: 56 mg/dL (ref >39.00)
LDL Cholesterol: 71 mg/dL (ref 0–99)
NonHDL: 103.73
Total CHOL/HDL Ratio: 3
Triglycerides: 162 mg/dL — ABNORMAL HIGH (ref 0.0–149.0)
VLDL: 32.4 mg/dL (ref 0.0–40.0)

## 2021-06-02 LAB — BASIC METABOLIC PANEL
BUN: 15 mg/dL (ref 6–23)
CO2: 27 mEq/L (ref 19–32)
Calcium: 9.3 mg/dL (ref 8.4–10.5)
Chloride: 103 mEq/L (ref 96–112)
Creatinine, Ser: 1.01 mg/dL (ref 0.40–1.20)
GFR: 65.03 mL/min (ref >60.00)
Glucose, Bld: 83 mg/dL (ref 70–99)
Potassium: 4.1 mEq/L (ref 3.5–5.1)
Sodium: 137 mEq/L (ref 135–145)

## 2021-06-02 LAB — HEPATIC FUNCTION PANEL
ALT: 15 U/L (ref 0–35)
AST: 15 U/L (ref 0–37)
Albumin: 4.3 g/dL (ref 3.5–5.2)
Alkaline Phosphatase: 55 U/L (ref 39–117)
Bilirubin, Direct: 0.1 mg/dL (ref 0.0–0.3)
Total Bilirubin: 0.6 mg/dL (ref 0.2–1.2)
Total Protein: 6.4 g/dL (ref 6.0–8.3)

## 2021-06-04 ENCOUNTER — Other Ambulatory Visit: Payer: Self-pay

## 2021-06-04 ENCOUNTER — Ambulatory Visit (INDEPENDENT_AMBULATORY_CARE_PROVIDER_SITE_OTHER): Payer: 59 | Admitting: Internal Medicine

## 2021-06-04 ENCOUNTER — Telehealth: Payer: Self-pay | Admitting: Internal Medicine

## 2021-06-04 ENCOUNTER — Encounter: Payer: Self-pay | Admitting: Internal Medicine

## 2021-06-04 VITALS — BP 118/72 | HR 72 | Temp 97.8°F | Resp 16 | Ht 63.0 in | Wt 213.2 lb

## 2021-06-04 DIAGNOSIS — D649 Anemia, unspecified: Secondary | ICD-10-CM

## 2021-06-04 DIAGNOSIS — E7800 Pure hypercholesterolemia, unspecified: Secondary | ICD-10-CM

## 2021-06-04 DIAGNOSIS — M069 Rheumatoid arthritis, unspecified: Secondary | ICD-10-CM | POA: Diagnosis not present

## 2021-06-04 DIAGNOSIS — G479 Sleep disorder, unspecified: Secondary | ICD-10-CM | POA: Diagnosis not present

## 2021-06-04 DIAGNOSIS — Z23 Encounter for immunization: Secondary | ICD-10-CM | POA: Diagnosis not present

## 2021-06-04 DIAGNOSIS — Z1231 Encounter for screening mammogram for malignant neoplasm of breast: Secondary | ICD-10-CM | POA: Diagnosis not present

## 2021-06-04 DIAGNOSIS — E538 Deficiency of other specified B group vitamins: Secondary | ICD-10-CM

## 2021-06-04 DIAGNOSIS — F439 Reaction to severe stress, unspecified: Secondary | ICD-10-CM | POA: Diagnosis not present

## 2021-06-04 MED ORDER — CYANOCOBALAMIN 1000 MCG/ML IJ SOLN: 1000.0000 ug | Freq: Once | INTRAMUSCULAR | Status: DC

## 2021-06-04 NOTE — Progress Notes (Signed)
[name], female   DOB: 1971/06/19, 50 y.o.   MRN: [id number]   Subjective:    [name], female    DOB: 07-28-1971, 51 y.o.   MRN: [id number]  This visit occurred during the SARS-CoV-2 public health emergency.  Safety protocols were in place, including screening questions prior to the visit, additional usage of staff PPE, and extensive cleaning of exam room while observing appropriate contact time as indicated for disinfecting solutions.   Patient here for follow up appt.    Marland Kitchen   HPI Here to follow up regarding her cholesterol and increased stress.  History of RA.  Tolerating MTX.  On Cimzia.  Has helped.  Pain is better.  Still limited in her activity, but pain is better.  Increased stress.  Husband working from home.  Discussed seeing a therapist.  No chest pain or sob reported.  No abdominal pain or bowel change reported.     Past Medical History:  Diagnosis Date   Allergy    Blood in stool    H/O   History of chicken pox    Hx: UTI (urinary tract infection)    Hyperlipidemia    Past Surgical History:  Procedure Laterality Date   AUGMENTATION MAMMAPLASTY  2001   CESAREAN SECTION  2007 and 2009   tummy tuck  2001   Family History  Problem Relation Age of Onset   Healthy Mother    Hypertension Mother    Cancer Father        non hogkins lymphoma   Atrial fibrillation Father    Heart disease Father    Hypertension Father    Arthritis Sister    Asthma Sister    Hypertension Sister    Healthy Daughter    Healthy Son    Alzheimer's disease Maternal Grandmother    Healthy Sister    Arthritis Maternal Grandfather    Colon cancer Paternal Uncle 77   Breast cancer Neg Hx    Social History   Socioeconomic History   Marital status: Married    Spouse name: Not on file   Number of children: Not on file   Years of education: Not on file   Highest education level: Not on file  Occupational History   Not on file  Tobacco Use   Smoking  status: Never   Smokeless tobacco: Never  Substance and Sexual Activity   Alcohol use: Yes    Alcohol/week: 0.0 standard drinks    Comment: occasional   Drug use: No   Sexual activity: Not on file  Other Topics Concern   Not on file  Social History Narrative   Not on file   Social Determinants of Health   Financial Resource Strain: Not on file  Food Insecurity: Not on file  Transportation Needs: Not on file  Physical Activity: Not on file  Stress: Not on file  Social Connections: Not on file     Review of Systems  Constitutional:  Negative for appetite change and unexpected weight change.  HENT:  Negative for congestion and sinus pressure.   Respiratory:  Negative for cough, chest tightness and shortness of breath.   Cardiovascular:  Negative for chest pain, palpitations and leg swelling.  Gastrointestinal:  Negative for abdominal pain, diarrhea, nausea and vomiting.  Genitourinary:  Negative for difficulty urinating and dysuria.  Musculoskeletal:  Negative for myalgias.       Joint pain - history of RA.  Medication helping.   Skin:  Negative for color change and rash.  Neurological:  Negative for dizziness, light-headedness and headaches.  Psychiatric/Behavioral:  Negative for agitation and dysphoric mood.       Objective:     BP 118/72   Pulse 72   Temp 97.8 F (36.6 C)   Resp 16   Ht '5\' 3"'$  (1.6 m)   Wt 213 lb 3.2 oz (96.7 kg)   SpO2 98%   BMI 37.77 kg/m  Wt Readings from Last 3 Encounters:  06/04/21 213 lb 3.2 oz (96.7 kg)  02/09/21 214 lb (97.1 kg)  01/27/21 214 lb 12.8 oz (97.4 kg)    Physical Exam   Outpatient Encounter Medications as of 06/04/2021  Medication Sig   acetaminophen (TYLENOL) 500 MG tablet Take 500 mg by mouth every 6 (six) hours as needed.   ALPRAZolam (XANAX) 0.25 MG tablet TAKE 1 TABLET BY MOUTH ONCE DAILY AS NEEDED FOR ANXIETY   buPROPion (WELLBUTRIN SR) 150 MG 12 hr tablet Take 1 tablet (150 mg total) by mouth 2 (two) times  daily.   Certolizumab Pegol 2 X 200 MG/ML PSKT Inject into the skin every 30 (thirty) days.   cetirizine (ZYRTEC) 10 MG tablet Take 10 mg by mouth daily.   Cyanocobalamin (B-12 COMPLIANCE INJECTION IJ) Inject as directed. Every 4 weeks   famotidine (PEPCID) 20 MG tablet Take 20 mg by mouth 2 (two) times daily.   ferrous sulfate 325 (65 FE) MG tablet Take 325 mg by mouth daily with breakfast.   fluticasone (FLONASE) 50 MCG/ACT nasal spray USE 2 SPRAYS IN EACH NOSTRIL ONCE DAILY   folic acid (FOLVITE) 1 MG tablet Take 1 tablet (1 mg total) by mouth once daily   gabapentin (NEURONTIN) 300 MG capsule TAKE 3 CAPSULES BY MOUTH EVERY EVENING   hydroxychloroquine (PLAQUENIL) 200 MG tablet TAKE 1 TABLET BY MOUTH TWICE DAILY FOR INFLAMMATORY ARTHRITIS   Melatonin 10 MG TABS Take 10 mg by mouth at bedtime.   meloxicam (MOBIC) 7.5 MG tablet TAKE 1 TABLET BY MOUTH TWICE DAILY WITH MEALS   methotrexate (RHEUMATREX) 2.5 MG tablet Take 4 tablets (10 mg total) by mouth every 7 (seven) days All on the same day   [DISCONTINUED] cefdinir (OMNICEF) 300 MG capsule Take 1 capsule (300 mg total) by mouth 2 (two) times daily. (Patient not taking: Reported on 03/24/2021)   [DISCONTINUED] folic acid (FOLVITE) 1 MG tablet Take 1 mg by mouth daily.   [DISCONTINUED] methotrexate (RHEUMATREX) 2.5 MG tablet Take 6 tablets by mouth every 7 (seven) days.   [DISCONTINUED] predniSONE (DELTASONE) 10 MG tablet Take 4 tablets x 1 day and then decrease by 1/2 tablet per day until down to zero mg. (Patient not taking: Reported on 03/24/2021)   [DISCONTINUED] rosuvastatin (CRESTOR) 10 MG tablet TAKE 1 TABLET BY MOUTH DAILY   Facility-Administered Encounter Medications as of 06/04/2021  Medication   cyanocobalamin ((VITAMIN B-12)) injection 1,000 mcg   cyanocobalamin ((VITAMIN B-12)) injection 1,000 mcg     Lab Results  Component Value Date   WBC 6.7 10/02/2020   HGB 14.5 10/02/2020   HCT 42.3 10/02/2020   PLT 304.0 10/02/2020    GLUCOSE 83 06/02/2021   CHOL 160 06/02/2021   TRIG 162.0 (H) 06/02/2021   HDL 56.00 06/02/2021   LDLDIRECT 87.0 04/03/2019   LDLCALC 71 06/02/2021   ALT 15 06/02/2021   AST 15 06/02/2021   NA 137 06/02/2021   K 4.1 06/02/2021   CL 103 06/02/2021   CREATININE 1.01 06/02/2021  BUN 15 06/02/2021   CO2 27 06/02/2021   TSH 1.80 07/01/2020    DG Bone Density  Result Date: 11/25/2020 EXAM: DUAL X-RAY ABSORPTIOMETRY (DXA) FOR BONE MINERAL DENSITY IMPRESSION: Your patient Curtistine Poppa completed a BMD test on 11/25/2020 using the [employer] (software version: 14.10) manufactured by [employer]. The following summarizes the results of our evaluation. Technologist:VLM PATIENT BIOGRAPHICAL: Name: Alexsandria, Billett Patient ID: [id number] Birth Date: [birth year] Height: 62.0 in. Gender: Female Exam Date: 11/25/2020 Weight: 206.0 lbs. Indications: Caucasian, Height Loss, Postmenopausal, Rheumatoid Arthritis Fractures: Foot Treatments: Methotrexate, Prednisone DENSITOMETRY RESULTS: Site      Region    Measured Date Measured Age WHO Classification Young Adult T-score BMD         %Change vs. Previous Significant Change (*) AP Spine L1-L4 11/25/2020 49.7 Normal 0.9 1.304 g/cm2 - - DualFemur Neck Left 11/25/2020 49.7 Normal -0.2 1.004 g/cm2 - - ASSESSMENT: The BMD measured at Femur Neck Left is 1.004 g/cm2 with a T-score of -0.2. This patient is considered normal according to [facility]) criteria. The scan quality is good. World Pharmacologist [facility]) criteria for post-menopausal, Caucasian Women: Normal:                   T-score at or above -1 SD Osteopenia/low bone mass: T-score between -1 and -2.5 SD Osteoporosis:             T-score at or below -2.5 SD RECOMMENDATIONS: 1. All patients should optimize calcium and vitamin D intake. 2. Consider FDA-approved medical therapies in postmenopausal women and men aged 88 years and older, based on the following: a. A hip or  vertebral(clinical or morphometric) fracture b. T-score < -2.5 at the femoral neck or spine after appropriate evaluation to exclude secondary causes c. Low bone mass (T-score between -1.0 and -2.5 at the femoral neck or spine) and a 10-year probability of a hip fracture > 3% or a 10-year probability of a major osteoporosis-related fracture > 20% based on the [place]-adapted WHO algorithm 3. Clinician judgment and/or patient preferences may indicate treatment for people with 10-year fracture probabilities above or below these levels FOLLOW-UP: People with diagnosed cases of osteoporosis or at high risk for fracture should have regular bone mineral density tests. For patients eligible for Medicare, routine testing is allowed once every 2 years. The testing frequency can be increased to one year for patients who have rapidly progressing disease, those who are receiving or discontinuing medical therapy to restore bone mass, or have additional risk factors. I have reviewed this report, and agree with the above findings. [facility] Electronically Signed   By: Lowella Grip III M.D.   On: 11/25/2020 14:57       Assessment & Plan:   Problem List Items Addressed This Visit     Anemia    Follow cbc.       Relevant Medications   cyanocobalamin ((VITAMIN B-12)) injection 1,000 mcg   Difficulty sleeping    Continue gabapentin.       Hypercholesterolemia    Continue crestor.  Low cholesterol diet and exercise.  Follow lipid panel and liver function tests.        Rheumatoid arthritis ([place])    Followed by Dr Posey Pronto.   On MTX, and cimzia.  Doing better.  Is helping pain. Follow.       Stress    Continues on wellbutrin.  Increased stress as outlined.  Discussed seeing a therapist.  Follow.        Other Visit Diagnoses     Encounter for screening mammogram for malignant neoplasm of breast    -  Primary   Need for immunization against influenza       Relevant Orders   Flu Vaccine QUAD  70moIM (Fluarix, Fluzone & Alfiuria Quad PF) (Completed)   B12 deficiency       Relevant Medications   cyanocobalamin ((VITAMIN B-12)) injection 1,000 mcg        CEinar Pheasant MD

## 2021-06-04 NOTE — Telephone Encounter (Signed)
Orders placed for labs

## 2021-06-04 NOTE — Telephone Encounter (Signed)
Patient scheduled for fasting labs in December per check out note. Orders need to be placed

## 2021-06-08 ENCOUNTER — Other Ambulatory Visit: Payer: Self-pay | Admitting: Internal Medicine

## 2021-06-08 ENCOUNTER — Other Ambulatory Visit: Payer: Self-pay

## 2021-06-08 MED FILL — Rosuvastatin Calcium Tab 10 MG: ORAL | 90 days supply | Qty: 90 | Fill #0 | Status: AC

## 2021-06-08 MED FILL — Gabapentin Cap 300 MG: ORAL | 30 days supply | Qty: 90 | Fill #1 | Status: CN

## 2021-06-09 ENCOUNTER — Other Ambulatory Visit: Payer: Self-pay | Admitting: Internal Medicine

## 2021-06-09 DIAGNOSIS — Z1231 Encounter for screening mammogram for malignant neoplasm of breast: Secondary | ICD-10-CM

## 2021-06-09 MED FILL — Gabapentin Cap 300 MG: ORAL | 30 days supply | Qty: 90 | Fill #1 | Status: CN

## 2021-06-10 ENCOUNTER — Other Ambulatory Visit: Payer: Self-pay

## 2021-06-10 MED FILL — Gabapentin Cap 300 MG: ORAL | 30 days supply | Qty: 90 | Fill #1 | Status: AC

## 2021-06-10 MED FILL — Gabapentin Cap 300 MG: ORAL | 90 days supply | Qty: 90 | Fill #1 | Status: CN

## 2021-06-13 ENCOUNTER — Encounter: Payer: Self-pay | Admitting: Internal Medicine

## 2021-06-13 NOTE — Assessment & Plan Note (Signed)
Continue crestor.  Low cholesterol diet and exercise. Follow lipid panel and liver function tests.   

## 2021-06-13 NOTE — Assessment & Plan Note (Signed)
Followed by Dr Posey Pronto.   On MTX, and cimzia.  Doing better.  Is helping pain. Follow.

## 2021-06-13 NOTE — Assessment & Plan Note (Signed)
Continues on wellbutrin.  Increased stress as outlined.  Discussed seeing a therapist.  Follow.

## 2021-06-13 NOTE — Assessment & Plan Note (Signed)
Follow cbc.  

## 2021-06-13 NOTE — Assessment & Plan Note (Signed)
Continue gabapentin.

## 2021-06-15 ENCOUNTER — Other Ambulatory Visit: Payer: Self-pay

## 2021-06-16 ENCOUNTER — Other Ambulatory Visit: Payer: Self-pay

## 2021-06-18 ENCOUNTER — Encounter: Payer: Self-pay | Admitting: Gastroenterology

## 2021-06-19 ENCOUNTER — Ambulatory Visit: Payer: 59 | Admitting: Anesthesiology

## 2021-06-19 ENCOUNTER — Encounter: Payer: Self-pay | Admitting: Gastroenterology

## 2021-06-19 ENCOUNTER — Ambulatory Visit
Admission: RE | Admit: 2021-06-19 | Discharge: 2021-06-19 | Disposition: A | Discharge status: Home / Self Care | Payer: 59 | Attending: Gastroenterology | Admitting: Gastroenterology

## 2021-06-19 ENCOUNTER — Encounter
Admission: RE | Disposition: A | Discharge status: Home / Self Care | Payer: Self-pay | Source: Home / Self Care | Attending: Gastroenterology

## 2021-06-19 ENCOUNTER — Telehealth: Payer: Self-pay

## 2021-06-19 DIAGNOSIS — Z1211 Encounter for screening for malignant neoplasm of colon: Secondary | ICD-10-CM | POA: Diagnosis not present

## 2021-06-19 DIAGNOSIS — D123 Benign neoplasm of transverse colon: Secondary | ICD-10-CM | POA: Insufficient documentation

## 2021-06-19 DIAGNOSIS — K6289 Other specified diseases of anus and rectum: Secondary | ICD-10-CM | POA: Diagnosis not present

## 2021-06-19 DIAGNOSIS — D125 Benign neoplasm of sigmoid colon: Secondary | ICD-10-CM | POA: Diagnosis not present

## 2021-06-19 DIAGNOSIS — K635 Polyp of colon: Secondary | ICD-10-CM | POA: Diagnosis not present

## 2021-06-19 DIAGNOSIS — D122 Benign neoplasm of ascending colon: Secondary | ICD-10-CM | POA: Insufficient documentation

## 2021-06-19 DIAGNOSIS — Z791 Long term (current) use of non-steroidal anti-inflammatories (NSAID): Secondary | ICD-10-CM | POA: Diagnosis not present

## 2021-06-19 DIAGNOSIS — Z79899 Other long term (current) drug therapy: Secondary | ICD-10-CM | POA: Insufficient documentation

## 2021-06-19 HISTORY — PX: COLONOSCOPY WITH PROPOFOL: SHX5780

## 2021-06-19 HISTORY — DX: Other specified postprocedural states: Z98.890

## 2021-06-19 HISTORY — DX: Nausea with vomiting, unspecified: R11.2

## 2021-06-19 HISTORY — DX: Other complications of anesthesia, initial encounter: T88.59XA

## 2021-06-19 LAB — POCT PREGNANCY, URINE: Preg Test, Ur: NEGATIVE

## 2021-06-19 SURGERY — COLONOSCOPY WITH PROPOFOL
Anesthesia: General

## 2021-06-19 MED ORDER — LIDOCAINE HCL (CARDIAC) PF 100 MG/5ML IV SOSY
PREFILLED_SYRINGE | INTRAVENOUS | Status: DC | PRN
  Administered 2021-06-19: 40 mg via INTRAVENOUS

## 2021-06-19 MED ORDER — PHENYLEPHRINE HCL (PRESSORS) 10 MG/ML IV SOLN
INTRAVENOUS | Status: AC
  Filled 2021-06-19: qty 1

## 2021-06-19 MED ORDER — PROPOFOL 500 MG/50ML IV EMUL
INTRAVENOUS | Status: AC
  Filled 2021-06-19: qty 50

## 2021-06-19 MED ORDER — PROPOFOL 10 MG/ML IV BOLUS
INTRAVENOUS | Status: DC | PRN
  Administered 2021-06-19: 100 mg via INTRAVENOUS

## 2021-06-19 MED ORDER — SODIUM CHLORIDE 0.9 % IV SOLN: INTRAVENOUS | Status: DC

## 2021-06-19 MED ORDER — PROPOFOL 500 MG/50ML IV EMUL
INTRAVENOUS | Status: DC | PRN
  Administered 2021-06-19: 150 ug/kg/min via INTRAVENOUS

## 2021-06-19 NOTE — H&P (Signed)
Vonda Antigua, MD [address] [place], [place], [place], [address] [address], [address], [place], [place], [address] Phone: [phone]  Fax: [phone]  Primary Care Physician:  Einar Pheasant, MD   Pre-Procedure History & Physical: HPI:  Tamara Shah is a 50 y.o. female is here for a colonoscopy.   Past Medical History:  Diagnosis Date   Allergy    Blood in stool    H/O   Complication of anesthesia    History of chicken pox    Hx: UTI (urinary tract infection)    Hyperlipidemia    PONV (postoperative nausea and vomiting)     Past Surgical History:  Procedure Laterality Date   AUGMENTATION MAMMAPLASTY  2001   CESAREAN SECTION  2007 and 2009   tummy tuck  2001    Prior to Admission medications   Medication Sig Start Date End Date Taking? Authorizing Provider  buPROPion (WELLBUTRIN SR) 150 MG 12 hr tablet Take 1 tablet (150 mg total) by mouth 2 (two) times daily. 05/18/21 11/14/21 Yes Einar Pheasant, MD  Certolizumab Pegol 2 X 200 MG/ML PSKT Inject into the skin every 30 (thirty) days.   Yes [provider]  cetirizine (ZYRTEC) 10 MG tablet Take 10 mg by mouth daily.   Yes [provider]  Cyanocobalamin (B-12 COMPLIANCE INJECTION IJ) Inject as directed. Every 4 weeks   Yes [provider]  famotidine (PEPCID) 20 MG tablet Take 20 mg by mouth 2 (two) times daily.   Yes [provider]  ferrous sulfate 325 (65 FE) MG tablet Take 325 mg by mouth daily with breakfast.   Yes [provider]  fluticasone (FLONASE) 50 MCG/ACT nasal spray USE 2 SPRAYS IN EACH NOSTRIL ONCE DAILY 03/03/21 03/03/22 Yes Einar Pheasant, MD  folic acid (FOLVITE) 1 MG tablet Take 1 tablet (1 mg total) by mouth once daily 05/18/21  Yes   gabapentin (NEURONTIN) 300 MG capsule TAKE 3 CAPSULES BY MOUTH EVERY EVENING 05/06/21 05/06/22 Yes Einar Pheasant, MD  hydroxychloroquine (PLAQUENIL) 200 MG tablet TAKE 1 TABLET BY MOUTH TWICE DAILY FOR  INFLAMMATORY ARTHRITIS 01/20/21  Yes   Melatonin 10 MG TABS Take 10 mg by mouth at bedtime.   Yes [provider]  meloxicam (MOBIC) 7.5 MG tablet TAKE 1 TABLET BY MOUTH TWICE DAILY WITH MEALS 04/13/21  Yes   methotrexate (RHEUMATREX) 2.5 MG tablet Take 4 tablets (10 mg total) by mouth every 7 (seven) days All on the same day 05/04/21  Yes   rosuvastatin (CRESTOR) 10 MG tablet TAKE 1 TABLET BY MOUTH DAILY 06/08/21 06/08/22 Yes Einar Pheasant, MD  acetaminophen (TYLENOL) 500 MG tablet Take 500 mg by mouth every 6 (six) hours as needed.    [provider]  ALPRAZolam Duanne Moron) 0.25 MG tablet TAKE 1 TABLET BY MOUTH ONCE DAILY AS NEEDED FOR ANXIETY 04/14/21   Einar Pheasant, MD    Allergies as of 03/24/2021   (No Known Allergies)    Family History  Problem Relation Age of Onset   Healthy Mother    Hypertension Mother    Cancer Father        non hogkins lymphoma   Atrial fibrillation Father    Heart disease Father    Hypertension Father    Arthritis Sister    Asthma Sister    Hypertension Sister    Healthy Daughter    Healthy Son    Alzheimer's disease Maternal Grandmother    Healthy Sister    Arthritis Maternal Grandfather  Colon cancer Paternal Uncle 20   Breast cancer Neg Hx     Social History   Socioeconomic History   Marital status: Married    Spouse name: Not on file   Number of children: Not on file   Years of education: Not on file   Highest education level: Not on file  Occupational History   Not on file  Tobacco Use   Smoking status: Never   Smokeless tobacco: Never  Vaping Use   Vaping Use: Never used  Substance and Sexual Activity   Alcohol use: Yes    Alcohol/week: 0.0 standard drinks    Comment: occasional   Drug use: No   Sexual activity: Not on file  Other Topics Concern   Not on file  Social History Narrative   Not on file   Social Determinants of Health   Financial Resource Strain: Not on file  Food Insecurity: Not on file   Transportation Needs: Not on file  Physical Activity: Not on file  Stress: Not on file  Social Connections: Not on file  Intimate Partner Violence: Not on file    Review of Systems: See HPI, otherwise negative ROS  Physical Exam: Constitutional: General:   Alert,  Well-developed, well-nourished, pleasant and cooperative in NAD BP (!) 164/71   Pulse 87   Temp (!) 96.3 F (35.7 C) (Temporal)   Resp 18   Ht 5\' 3"  (1.6 m)   Wt 97.7 kg   LMP  (LMP Unknown) Comment: Pregnancy test negative.  SpO2 100%   BMI 38.16 kg/m   Head: Normocephalic, atraumatic.   Eyes:  Sclera clear, no icterus.   Conjunctiva pink.   Mouth:  No deformity or lesions, oropharynx pink & moist.  Neck:  Supple, trachea midline  Respiratory: Normal respiratory effort  Gastrointestinal:  Soft, non-tender and non-distended without masses, hepatosplenomegaly or hernias noted.  No guarding or rebound tenderness.     Cardiac: No clubbing or edema.  No cyanosis. Normal posterior tibial pedal pulses noted.  Lymphatic:  No significant cervical adenopathy.  Psych:  Alert and cooperative. Normal mood and affect.  Musculoskeletal:   Symmetrical without gross deformities. 5/5 Lower extremity strength bilaterally.  Skin: Warm. Intact without significant lesions or rashes. No jaundice.  Neurologic:  Face symmetrical, tongue midline, Normal sensation to touch;  grossly normal neurologically.  Psych:  Alert and oriented x3, Alert and cooperative. Normal mood and affect.  Impression/Plan: Tamara Shah is here for a colonoscopy to be performed for average risk screening.  Risks, benefits, limitations, and alternatives regarding  colonoscopy have been reviewed with the patient.  Questions have been answered.  All parties agreeable.   Virgel Manifold, MD  06/19/2021, 7:31 AM

## 2021-06-19 NOTE — Op Note (Signed)
[facility] Patient Name: Tamara Shah Procedure Date: 06/19/2021 7:37 AM MRN: [id number] Account #: [id number] Date of Birth: 1971/07/20 Admit Type: Outpatient Age: 50 Room: [facility] ENDO ROOM 2 Gender: Female Note Status: Finalized Instrument Name: Jasper Riling [id number] Procedure:             Colonoscopy Indications:           Screening for colorectal malignant neoplasm Providers:             Fadia Marlar B. Bonna Gains MD, MD Referring MD:          Einar Pheasant, MD (Referring MD) Medicines:             Monitored Anesthesia Care Complications:         No immediate complications. Procedure:             Pre-Anesthesia Assessment:                        - ASA Grade Assessment: II - A patient with mild                         systemic disease.                        - Prior to the procedure, a History and Physical was                         performed, and patient medications, allergies and                         sensitivities were reviewed. The patient's tolerance                         of previous anesthesia was reviewed.                        - The risks and benefits of the procedure and the                         sedation options and risks were discussed with the                         patient. All questions were answered and informed                         consent was obtained.                        - Patient identification and proposed procedure were                         verified prior to the procedure by the physician, the                         nurse, the anesthesiologist, the anesthetist and the                         technician. The procedure was verified in the  procedure room.                        After obtaining informed consent, the colonoscope was                         passed under direct vision. Throughout the procedure,                         the patient's blood pressure, pulse, and oxygen                          saturations were monitored continuously. The                         Colonoscope was introduced through the anus and                         advanced to the the cecum, identified by appendiceal                         orifice and ileocecal valve. The colonoscopy was                         performed with ease. The patient tolerated the                         procedure well. The quality of the bowel preparation                         was good. Findings:      The perianal and digital rectal examinations were normal.      Two sessile polyps were found in the transverse colon. The polyps were 5       to 10 mm in size. These polyps were removed with a cold snare. Resection       and retrieval were complete.      Two flat and sessile polyps were found in the ascending colon. The       polyps were 4 to 5 mm in size. These polyps were removed with a cold       snare. Resection and retrieval were complete.      Three sessile polyps were found in the sigmoid colon. The polyps were 4       to 5 mm in size. These polyps were removed with a cold snare. Resection       and retrieval were complete.      The exam was otherwise without abnormality.      The rectum, sigmoid colon, descending colon, transverse colon, ascending       colon and cecum appeared normal.      Anal papilla(e) were hypertrophied.      No additional abnormalities were found on retroflexion. Impression:            - Two 5 to 10 mm polyps in the transverse colon,                         removed with a cold snare. Resected and retrieved.                        -  Two 4 to 5 mm polyps in the ascending colon, removed                         with a cold snare. Resected and retrieved.                        - Three 4 to 5 mm polyps in the sigmoid colon, removed                         with a cold snare. Resected and retrieved.                        - The examination was otherwise normal.                        - The rectum,  sigmoid colon, descending colon,                         transverse colon, ascending colon and cecum are normal.                        - Anal papilla(e) were hypertrophied. Recommendation:        - Refer to a surgeon to evaluate the large polypoid                         lesion vs rectal hypertrophied papillae.                        - Discharge patient to home (with escort).                        - Advance diet as tolerated.                        - Continue present medications.                        - Await pathology results.                        - Repeat colonoscopy date to be determined after                         pending pathology results are reviewed.                        - The findings and recommendations were discussed with                         the patient.                        - The findings and recommendations were discussed with                         the patient's family.                        - Return to primary care physician as previously  scheduled. Procedure Code(s):     --- Professional ---                        [phone], Colonoscopy, flexible; with removal of                         tumor(s), polyp(s), or other lesion(s) by snare                         technique Diagnosis Code(s):     --- Professional ---                        Z12.11, Encounter for screening for malignant neoplasm                         of colon                        K63.5, Polyp of colon CPT copyright 2019 American Medical Association. All rights reserved. The codes documented in this report are preliminary and upon coder review may  be revised to meet current compliance requirements.  Vonda Antigua, MD Margretta Sidle B. Bonna Gains MD, MD 06/19/2021 8:30:57 AM This report has been signed electronically. Number of Addenda: 0 Note Initiated On: 06/19/2021 7:37 AM Scope Withdrawal Time: 0 hours 18 minutes 45 seconds  Total Procedure Duration: 0 hours 39 minutes 37  seconds  Estimated Blood Loss:  Estimated blood loss: none.      [facility]

## 2021-06-19 NOTE — Anesthesia Postprocedure Evaluation (Signed)
Anesthesia Post Note  Patient: Tamara Shah  Procedure(s) Performed: COLONOSCOPY WITH PROPOFOL  Patient location during evaluation: Phase II Anesthesia Type: General Level of consciousness: awake and alert Pain management: pain level controlled Vital Signs Assessment: post-procedure vital signs reviewed and stable Respiratory status: spontaneous breathing, nonlabored ventilation and respiratory function stable Cardiovascular status: blood pressure returned to baseline and stable Postop Assessment: no apparent nausea or vomiting Anesthetic complications: no   No notable events documented.   Last Vitals:  Vitals:   06/19/21 0839 06/19/21 0901  BP:  (!) 144/91  Pulse: 67 60  Resp: 17 15  Temp:    SpO2: 100% 100%    Last Pain:  Vitals:   06/19/21 0705  TempSrc: Temporal  PainSc: 0-No pain                 [place] Ouch

## 2021-06-19 NOTE — Telephone Encounter (Signed)
Referral, procedure, insurance and demographics faxed

## 2021-06-19 NOTE — Transfer of Care (Signed)
Immediate Anesthesia Transfer of Care Note  Patient: Tamara Shah  Procedure(s) Performed: COLONOSCOPY WITH PROPOFOL  Patient Location: PACU and Endoscopy Unit  Anesthesia Type:General  Level of Consciousness: awake, alert  and oriented  Airway & Oxygen Therapy: Patient Spontanous Breathing  Post-op Assessment: Report given to RN and Post -op Vital signs reviewed and stable  Post vital signs: Reviewed and stable  Last Vitals:  Vitals Value Taken Time  BP 145/90 06/19/21 0829  Temp    Pulse 70 06/19/21 0830  Resp 16 06/19/21 0830  SpO2 100 % 06/19/21 0830  Vitals shown include unvalidated device data.  Last Pain:  Vitals:   06/19/21 0705  TempSrc: Temporal  PainSc: 0-No pain         Complications: No notable events documented.

## 2021-06-19 NOTE — Anesthesia Preprocedure Evaluation (Addendum)
Anesthesia Evaluation  Patient identified by MRN, date of birth, ID band Patient awake    Reviewed: Allergy & Precautions, NPO status , Patient's Chart, lab work & pertinent test results  History of Anesthesia Complications (+) PONV  Airway Mallampati: I  TM Distance: >3 FB Neck ROM: Full    Dental no notable dental hx.    Pulmonary neg pulmonary ROS,    Pulmonary exam normal        Cardiovascular Exercise Tolerance: Poor METS: 3 - Mets Normal cardiovascular exam     Neuro/Psych PSYCHIATRIC DISORDERS Anxiety negative neurological ROS     GI/Hepatic Neg liver ROS, GERD  Controlled and Medicated,  Endo/Other  Obesity  Renal/GU negative Renal ROS  negative genitourinary   Musculoskeletal  (+) Arthritis , Rheumatoid disorders,    Abdominal (+) + obese,   Peds negative pediatric ROS (+)  Hematology negative hematology ROS (+)   Anesthesia Other Findings   Reproductive/Obstetrics negative OB ROS                            Anesthesia Physical Anesthesia Plan  ASA: 2  Anesthesia Plan: General   Post-op Pain Management:    Induction: Intravenous  PONV Risk Score and Plan: 4 or greater and Propofol infusion and Treatment may vary due to age or medical condition  Airway Management Planned: Natural Airway and Nasal Cannula  Additional Equipment:   Intra-op Plan:   Post-operative Plan:   Informed Consent: I have reviewed the patients History and Physical, chart, labs and discussed the procedure including the risks, benefits and alternatives for the proposed anesthesia with the patient or authorized representative who has indicated his/her understanding and acceptance.     Dental Advisory Given  Plan Discussed with: Anesthesiologist, CRNA and Surgeon  Anesthesia Plan Comments: (Patient voiced understanding.)       Anesthesia Quick Evaluation

## 2021-06-22 ENCOUNTER — Other Ambulatory Visit: Payer: Self-pay

## 2021-06-22 ENCOUNTER — Encounter: Payer: Self-pay | Admitting: Gastroenterology

## 2021-06-22 ENCOUNTER — Other Ambulatory Visit: Payer: Self-pay | Admitting: Internal Medicine

## 2021-06-22 LAB — SURGICAL PATHOLOGY

## 2021-06-22 MED ORDER — FLUTICASONE PROPIONATE 50 MCG/ACT NA SUSP
2.0000 | Freq: Every day | NASAL | 2 refills | Status: DC
  Filled 2021-06-22: qty 16, 30d supply, fill #0
  Filled 2021-08-03: qty 16, 30d supply, fill #1
  Filled 2021-09-13: qty 16, 30d supply, fill #2

## 2021-06-23 DIAGNOSIS — M0609 Rheumatoid arthritis without rheumatoid factor, multiple sites: Secondary | ICD-10-CM | POA: Diagnosis not present

## 2021-06-26 ENCOUNTER — Other Ambulatory Visit: Payer: Self-pay

## 2021-06-26 ENCOUNTER — Ambulatory Visit: Payer: 59 | Attending: Internal Medicine

## 2021-06-26 DIAGNOSIS — Z23 Encounter for immunization: Secondary | ICD-10-CM

## 2021-06-26 MED ORDER — PFIZER COVID-19 VAC BIVALENT 30 MCG/0.3ML IM SUSP
INTRAMUSCULAR | 0 refills | Status: DC
  Filled 2021-06-26: qty 0.3, 1d supply, fill #0

## 2021-06-26 NOTE — Progress Notes (Signed)
   Covid-19 Vaccination Clinic  Name:  Tamara Shah    MRN: [id number] DOB: Jul 23, 1971  06/26/2021  Tamara Shah was observed post Covid-19 immunization for 15 minutes without incident. She was provided with Vaccine Information Sheet and instruction to access the V-Safe system.   Tamara Shah was instructed to call 911 with any severe reactions post vaccine: Difficulty breathing  Swelling of face and throat  A fast heartbeat  A bad rash all over body  Dizziness and weakness   Lu Duffel, PharmD, MBA Clinical Acute Care Pharmacist

## 2021-07-06 ENCOUNTER — Other Ambulatory Visit: Payer: Self-pay | Admitting: Internal Medicine

## 2021-07-06 ENCOUNTER — Other Ambulatory Visit: Payer: Self-pay

## 2021-07-07 ENCOUNTER — Other Ambulatory Visit: Payer: Self-pay

## 2021-07-07 ENCOUNTER — Ambulatory Visit (INDEPENDENT_AMBULATORY_CARE_PROVIDER_SITE_OTHER): Payer: 59

## 2021-07-07 DIAGNOSIS — E538 Deficiency of other specified B group vitamins: Secondary | ICD-10-CM

## 2021-07-07 MED ORDER — CYANOCOBALAMIN 1000 MCG/ML IJ SOLN
1000.0000 ug | Freq: Once | INTRAMUSCULAR | Status: AC
  Administered 2021-07-07: 1000 ug via INTRAMUSCULAR

## 2021-07-07 MED FILL — Gabapentin Cap 300 MG: ORAL | 30 days supply | Qty: 90 | Fill #0 | Status: AC

## 2021-07-07 NOTE — Progress Notes (Signed)
Patient presented for B 12 injection to right deltoid, patient voiced no concerns nor showed any signs of distress during injection. 

## 2021-07-13 ENCOUNTER — Other Ambulatory Visit: Payer: Self-pay

## 2021-07-13 MED ORDER — HYDROXYCHLOROQUINE SULFATE 200 MG PO TABS
ORAL_TABLET | ORAL | 1 refills | Status: DC
  Filled 2021-07-13: qty 180, 90d supply, fill #0
  Filled 2021-10-11: qty 180, 90d supply, fill #1

## 2021-07-15 DIAGNOSIS — F411 Generalized anxiety disorder: Secondary | ICD-10-CM | POA: Diagnosis not present

## 2021-07-16 NOTE — Telephone Encounter (Signed)
error 

## 2021-07-21 ENCOUNTER — Other Ambulatory Visit: Payer: Self-pay

## 2021-07-21 DIAGNOSIS — F411 Generalized anxiety disorder: Secondary | ICD-10-CM | POA: Diagnosis not present

## 2021-07-21 DIAGNOSIS — M0609 Rheumatoid arthritis without rheumatoid factor, multiple sites: Secondary | ICD-10-CM | POA: Diagnosis not present

## 2021-07-30 DIAGNOSIS — F411 Generalized anxiety disorder: Secondary | ICD-10-CM | POA: Diagnosis not present

## 2021-08-02 ENCOUNTER — Other Ambulatory Visit: Payer: Self-pay | Admitting: Internal Medicine

## 2021-08-02 MED FILL — Gabapentin Cap 300 MG: ORAL | 30 days supply | Qty: 90 | Fill #1 | Status: AC

## 2021-08-03 ENCOUNTER — Other Ambulatory Visit: Payer: Self-pay | Admitting: Internal Medicine

## 2021-08-03 ENCOUNTER — Other Ambulatory Visit: Payer: Self-pay

## 2021-08-03 NOTE — Telephone Encounter (Signed)
RX Refill: xanax Last Seen: 06-04-21 Last Ordered: 04-14-21 Next Appt: 09-04-21

## 2021-08-04 ENCOUNTER — Other Ambulatory Visit: Payer: Self-pay

## 2021-08-04 MED FILL — Alprazolam Tab 0.25 MG: ORAL | 30 days supply | Qty: 30 | Fill #0 | Status: AC

## 2021-08-06 DIAGNOSIS — F411 Generalized anxiety disorder: Secondary | ICD-10-CM | POA: Diagnosis not present

## 2021-08-07 ENCOUNTER — Ambulatory Visit (INDEPENDENT_AMBULATORY_CARE_PROVIDER_SITE_OTHER): Payer: 59

## 2021-08-07 ENCOUNTER — Other Ambulatory Visit: Payer: Self-pay

## 2021-08-07 DIAGNOSIS — E538 Deficiency of other specified B group vitamins: Secondary | ICD-10-CM

## 2021-08-07 MED ORDER — CYANOCOBALAMIN 1000 MCG/ML IJ SOLN
1000.0000 ug | Freq: Once | INTRAMUSCULAR | Status: AC
  Administered 2021-08-07: 1000 ug via INTRAMUSCULAR

## 2021-08-07 NOTE — Progress Notes (Signed)
Patient came in today for B-12 injection given in left deltoid IM. Patient tolerated well with no signs of distress.

## 2021-08-12 DIAGNOSIS — F411 Generalized anxiety disorder: Secondary | ICD-10-CM | POA: Diagnosis not present

## 2021-08-16 ENCOUNTER — Other Ambulatory Visit: Payer: Self-pay

## 2021-08-17 ENCOUNTER — Other Ambulatory Visit: Payer: Self-pay

## 2021-08-17 MED ORDER — MELOXICAM 7.5 MG PO TABS
7.5000 mg | ORAL_TABLET | Freq: Two times a day (BID) | ORAL | 3 refills | Status: DC
  Filled 2021-08-17: qty 60, 30d supply, fill #0
  Filled 2021-09-13: qty 60, 30d supply, fill #1
  Filled 2021-10-11: qty 60, 30d supply, fill #2
  Filled 2021-11-16: qty 60, 30d supply, fill #3

## 2021-08-18 ENCOUNTER — Other Ambulatory Visit: Payer: Self-pay

## 2021-08-18 DIAGNOSIS — M0609 Rheumatoid arthritis without rheumatoid factor, multiple sites: Secondary | ICD-10-CM | POA: Diagnosis not present

## 2021-08-20 DIAGNOSIS — F411 Generalized anxiety disorder: Secondary | ICD-10-CM | POA: Diagnosis not present

## 2021-08-24 DIAGNOSIS — Z79899 Other long term (current) drug therapy: Secondary | ICD-10-CM | POA: Diagnosis not present

## 2021-08-24 DIAGNOSIS — M159 Polyosteoarthritis, unspecified: Secondary | ICD-10-CM | POA: Diagnosis not present

## 2021-08-24 DIAGNOSIS — M0609 Rheumatoid arthritis without rheumatoid factor, multiple sites: Secondary | ICD-10-CM | POA: Diagnosis not present

## 2021-08-27 DIAGNOSIS — F411 Generalized anxiety disorder: Secondary | ICD-10-CM | POA: Diagnosis not present

## 2021-08-30 ENCOUNTER — Other Ambulatory Visit: Payer: Self-pay | Admitting: Internal Medicine

## 2021-08-31 ENCOUNTER — Other Ambulatory Visit: Payer: Self-pay

## 2021-08-31 ENCOUNTER — Ambulatory Visit
Admission: RE | Admit: 2021-08-31 | Discharge: 2021-08-31 | Disposition: A | Discharge status: Home / Self Care | Payer: 59 | Source: Ambulatory Visit | Attending: Internal Medicine | Admitting: Internal Medicine

## 2021-08-31 DIAGNOSIS — Z1231 Encounter for screening mammogram for malignant neoplasm of breast: Secondary | ICD-10-CM | POA: Diagnosis not present

## 2021-08-31 MED ORDER — GABAPENTIN 300 MG PO CAPS
ORAL_CAPSULE | ORAL | 1 refills | Status: DC
  Filled 2021-08-31: qty 90, 30d supply, fill #0
  Filled 2021-09-27: qty 90, 30d supply, fill #1

## 2021-09-01 ENCOUNTER — Other Ambulatory Visit (INDEPENDENT_AMBULATORY_CARE_PROVIDER_SITE_OTHER): Payer: 59

## 2021-09-01 ENCOUNTER — Other Ambulatory Visit: Payer: Self-pay

## 2021-09-01 DIAGNOSIS — E7800 Pure hypercholesterolemia, unspecified: Secondary | ICD-10-CM

## 2021-09-01 DIAGNOSIS — D649 Anemia, unspecified: Secondary | ICD-10-CM | POA: Diagnosis not present

## 2021-09-01 LAB — HEPATIC FUNCTION PANEL
ALT: 13 U/L (ref 0–35)
AST: 13 U/L (ref 0–37)
Albumin: 4.1 g/dL (ref 3.5–5.2)
Alkaline Phosphatase: 48 U/L (ref 39–117)
Bilirubin, Direct: 0.1 mg/dL (ref 0.0–0.3)
Total Bilirubin: 0.4 mg/dL (ref 0.2–1.2)
Total Protein: 6 g/dL (ref 6.0–8.3)

## 2021-09-01 LAB — CBC WITH DIFFERENTIAL/PLATELET
Basophils Absolute: 0 10*3/uL (ref 0.0–0.1)
Basophils Relative: 0.6 % (ref 0.0–3.0)
Eosinophils Absolute: 0.1 10*3/uL (ref 0.0–0.7)
Eosinophils Relative: 2.5 % (ref 0.0–5.0)
HCT: 40 % (ref 36.0–46.0)
Hemoglobin: 13.3 g/dL (ref 12.0–15.0)
Lymphocytes Relative: 30.6 % (ref 12.0–46.0)
Lymphs Abs: 1.4 10*3/uL (ref 0.7–4.0)
MCHC: 33.2 g/dL (ref 30.0–36.0)
MCV: 92.3 fl (ref 78.0–100.0)
Monocytes Absolute: 0.4 10*3/uL (ref 0.1–1.0)
Monocytes Relative: 7.9 % (ref 3.0–12.0)
Neutro Abs: 2.7 10*3/uL (ref 1.4–7.7)
Neutrophils Relative %: 58.4 % (ref 43.0–77.0)
Platelets: 260 10*3/uL (ref 150.0–400.0)
RBC: 4.34 Mil/uL (ref 3.87–5.11)
RDW: 12.1 % (ref 11.5–15.5)
WBC: 4.6 10*3/uL (ref 4.0–10.5)

## 2021-09-01 LAB — BASIC METABOLIC PANEL
BUN: 12 mg/dL (ref 6–23)
CO2: 30 mEq/L (ref 19–32)
Calcium: 9 mg/dL (ref 8.4–10.5)
Chloride: 106 mEq/L (ref 96–112)
Creatinine, Ser: 0.76 mg/dL (ref 0.40–1.20)
GFR: 91.32 mL/min (ref >60.00)
Glucose, Bld: 76 mg/dL (ref 70–99)
Potassium: 4.3 mEq/L (ref 3.5–5.1)
Sodium: 141 mEq/L (ref 135–145)

## 2021-09-01 LAB — TSH: TSH: 1.78 u[IU]/mL (ref 0.35–5.50)

## 2021-09-01 LAB — LIPID PANEL
Cholesterol: 155 mg/dL (ref 0–200)
HDL: 56.6 mg/dL (ref >39.00)
LDL Cholesterol: 72 mg/dL (ref 0–99)
NonHDL: 98.52
Total CHOL/HDL Ratio: 3
Triglycerides: 133 mg/dL (ref 0.0–149.0)
VLDL: 26.6 mg/dL (ref 0.0–40.0)

## 2021-09-03 DIAGNOSIS — M25561 Pain in right knee: Secondary | ICD-10-CM | POA: Diagnosis not present

## 2021-09-03 DIAGNOSIS — M170 Bilateral primary osteoarthritis of knee: Secondary | ICD-10-CM | POA: Diagnosis not present

## 2021-09-03 DIAGNOSIS — M25562 Pain in left knee: Secondary | ICD-10-CM | POA: Diagnosis not present

## 2021-09-03 DIAGNOSIS — M2550 Pain in unspecified joint: Secondary | ICD-10-CM | POA: Diagnosis not present

## 2021-09-04 ENCOUNTER — Ambulatory Visit (INDEPENDENT_AMBULATORY_CARE_PROVIDER_SITE_OTHER): Payer: 59 | Admitting: Internal Medicine

## 2021-09-04 ENCOUNTER — Other Ambulatory Visit: Payer: Self-pay

## 2021-09-04 ENCOUNTER — Encounter: Payer: Self-pay | Admitting: Internal Medicine

## 2021-09-04 VITALS — BP 122/70 | HR 96 | Temp 98.0°F | Resp 16 | Ht 63.0 in | Wt 212.0 lb

## 2021-09-04 DIAGNOSIS — M069 Rheumatoid arthritis, unspecified: Secondary | ICD-10-CM

## 2021-09-04 DIAGNOSIS — D75839 Thrombocytosis, unspecified: Secondary | ICD-10-CM | POA: Diagnosis not present

## 2021-09-04 DIAGNOSIS — Z9882 Breast implant status: Secondary | ICD-10-CM | POA: Diagnosis not present

## 2021-09-04 DIAGNOSIS — F439 Reaction to severe stress, unspecified: Secondary | ICD-10-CM | POA: Diagnosis not present

## 2021-09-04 DIAGNOSIS — Z1211 Encounter for screening for malignant neoplasm of colon: Secondary | ICD-10-CM | POA: Diagnosis not present

## 2021-09-04 DIAGNOSIS — E7800 Pure hypercholesterolemia, unspecified: Secondary | ICD-10-CM | POA: Diagnosis not present

## 2021-09-04 NOTE — Progress Notes (Signed)
[name], female   DOB: [birth year], 50 y.o.   MRN: [id number]   Subjective:    [name], female    DOB: 1971/02/12, 50 y.o.   MRN: [id number]  This visit occurred during the SARS-CoV-2 public health emergency.  Safety protocols were in place, including screening questions prior to the visit, additional usage of staff PPE, and extensive cleaning of exam room while observing appropriate contact time as indicated for disinfecting solutions.   Patient here for a scheduled follow up.    HPI Here to follow up regarding her cholesterol, increased stress and RA.  Off MTX.  Followed by Dr Posey Pronto.  Continues on plaquenil and cimzia.  Stable.  Also seeing Dr Roland Rack.  Had f/u yesterday.  Planning gel treatment for knees.  Increased stress recently with family issues.  Discussed.  Seeing a Social worker.  Overall she feels she is handling things relatively well.  Taking 1/2 xanax prn.  She had questions regarding her breast implants.  Was questioning if they could be contributing to any of her current medication issues.  Request referral back to surgeon who placed implants - Dr Grac[facility]).     Past Medical History:  Diagnosis Date   Allergy    Blood in stool    H/O   Complication of anesthesia    History of chicken pox    Hx: UTI (urinary tract infection)    Hyperlipidemia    PONV (postoperative nausea and vomiting)    Past Surgical History:  Procedure Laterality Date   AUGMENTATION MAMMAPLASTY  2001   CESAREAN SECTION  2007 and 2009   COLONOSCOPY WITH PROPOFOL N/A 06/19/2021   Procedure: COLONOSCOPY WITH PROPOFOL;  Surgeon: Virgel Manifold, MD;  Location: [facility] ENDOSCOPY;  Service: Endoscopy;  Laterality: N/A;   tummy tuck  2001   Family History  Problem Relation Age of Onset   Healthy Mother    Hypertension Mother    Cancer Father        non hogkins lymphoma   Atrial fibrillation Father    Heart disease Father    Hypertension Father     Arthritis Sister    Asthma Sister    Hypertension Sister    Healthy Daughter    Healthy Son    Alzheimer's disease Maternal Grandmother    Healthy Sister    Arthritis Maternal Grandfather    Colon cancer Paternal Uncle 29   Breast cancer Neg Hx    Social History   Socioeconomic History   Marital status: Married    Spouse name: Not on file   Number of children: Not on file   Years of education: Not on file   Highest education level: Not on file  Occupational History   Not on file  Tobacco Use   Smoking status: Never   Smokeless tobacco: Never  Vaping Use   Vaping Use: Never used  Substance and Sexual Activity   Alcohol use: Yes    Alcohol/week: 0.0 standard drinks    Comment: occasional   Drug use: No   Sexual activity: Not on file  Other Topics Concern   Not on file  Social History Narrative   Not on file   Social Determinants of Health   Financial Resource Strain: Not on file  Food Insecurity: Not on file  Transportation Needs: Not on file  Physical Activity: Not on file  Stress: Not on file  Social Connections: Not on file  Review of Systems  Constitutional:  Negative for appetite change and unexpected weight change.  HENT:  Negative for congestion and sinus pressure.   Respiratory:  Negative for cough, chest tightness and shortness of breath.   Cardiovascular:  Negative for chest pain, palpitations and leg swelling.  Gastrointestinal:  Negative for abdominal pain, diarrhea, nausea and vomiting.  Genitourinary:  Negative for difficulty urinating and dysuria.  Musculoskeletal:  Negative for joint swelling.       Joint pain as outlined - seeing Dr Posey Pronto.   Skin:  Negative for color change and rash.  Neurological:  Negative for dizziness, light-headedness and headaches.  Psychiatric/Behavioral:  Negative for agitation and dysphoric mood.        Increased stress as outlined.        Objective:     BP 122/70    Pulse 96    Temp 98 F (36.7 C)    Resp  16    Ht 5\' 3"  (1.6 m)    Wt 212 lb (96.2 kg)    SpO2 99%    BMI 37.55 kg/m  Wt Readings from Last 3 Encounters:  09/04/21 212 lb (96.2 kg)  06/19/21 215 lb 6.9 oz (97.7 kg)  06/04/21 213 lb 3.2 oz (96.7 kg)    Physical Exam Vitals reviewed.  Constitutional:      General: She is not in acute distress.    Appearance: Normal appearance.  HENT:     Head: Normocephalic and atraumatic.     Right Ear: External ear normal.     Left Ear: External ear normal.  Eyes:     General: No scleral icterus.       Right eye: No discharge.        Left eye: No discharge.     Conjunctiva/sclera: Conjunctivae normal.  Neck:     Thyroid: No thyromegaly.  Cardiovascular:     Rate and Rhythm: Normal rate and regular rhythm.  Pulmonary:     Effort: No respiratory distress.     Breath sounds: Normal breath sounds. No wheezing.  Abdominal:     General: Bowel sounds are normal.     Palpations: Abdomen is soft.     Tenderness: There is no abdominal tenderness.  Musculoskeletal:        General: No swelling or tenderness.     Cervical back: Neck supple. No tenderness.  Lymphadenopathy:     Cervical: No cervical adenopathy.  Skin:    Findings: No erythema or rash.  Neurological:     Mental Status: She is alert.  Psychiatric:        Mood and Affect: Mood normal.        Behavior: Behavior normal.     Outpatient Encounter Medications as of 09/04/2021  Medication Sig   acetaminophen (TYLENOL) 500 MG tablet Take 500 mg by mouth every 6 (six) hours as needed.   ALPRAZolam (XANAX) 0.25 MG tablet TAKE 1 TABLET BY MOUTH ONCE DAILY AS NEEDED FOR ANXIETY   buPROPion (WELLBUTRIN SR) 150 MG 12 hr tablet Take 1 tablet (150 mg total) by mouth 2 (two) times daily.   Certolizumab Pegol 2 X 200 MG/ML PSKT Inject into the skin every 30 (thirty) days.   cetirizine (ZYRTEC) 10 MG tablet Take 10 mg by mouth daily.   COVID-19 mRNA bivalent vaccine, Pfizer, (PFIZER COVID-19 VAC BIVALENT) injection Inject into the  muscle.   Cyanocobalamin (B-12 COMPLIANCE INJECTION IJ) Inject as directed. Every 4 weeks   famotidine (PEPCID) 20 MG tablet  Take 20 mg by mouth 2 (two) times daily.   ferrous sulfate 325 (65 FE) MG tablet Take 325 mg by mouth daily with breakfast.   fluticasone (FLONASE) 50 MCG/ACT nasal spray USE 2 SPRAYS IN EACH NOSTRIL ONCE DAILY   folic acid (FOLVITE) 1 MG tablet Take 1 tablet (1 mg total) by mouth once daily   gabapentin (NEURONTIN) 300 MG capsule TAKE 3 CAPSULES BY MOUTH EVERY EVENING   hydroxychloroquine (PLAQUENIL) 200 MG tablet TAKE 1 TABLET BY MOUTH TWICE DAILY FOR INFLAMMATORY ARTHRITIS   Melatonin 10 MG TABS Take 10 mg by mouth at bedtime.   meloxicam (MOBIC) 7.5 MG tablet TAKE 1 TABLET BY MOUTH TWICE DAILY WITH MEALS   rosuvastatin (CRESTOR) 10 MG tablet TAKE 1 TABLET BY MOUTH DAILY   [DISCONTINUED] methotrexate (RHEUMATREX) 2.5 MG tablet Take 4 tablets (10 mg total) by mouth every 7 (seven) days All on the same day   Facility-Administered Encounter Medications as of 09/04/2021  Medication   cyanocobalamin ((VITAMIN B-12)) injection 1,000 mcg   cyanocobalamin ((VITAMIN B-12)) injection 1,000 mcg     Lab Results  Component Value Date   WBC 4.6 09/01/2021   HGB 13.3 09/01/2021   HCT 40.0 09/01/2021   PLT 260.0 09/01/2021   GLUCOSE 76 09/01/2021   CHOL 155 09/01/2021   TRIG 133.0 09/01/2021   HDL 56.60 09/01/2021   LDLDIRECT 87.0 04/03/2019   LDLCALC 72 09/01/2021   ALT 13 09/01/2021   AST 13 09/01/2021   NA 141 09/01/2021   K 4.3 09/01/2021   CL 106 09/01/2021   CREATININE 0.76 09/01/2021   BUN 12 09/01/2021   CO2 30 09/01/2021   TSH 1.78 09/01/2021    MM 3D SCREEN BREAST W/IMPLANT BILATERAL  Result Date: 08/31/2021 CLINICAL DATA:  Screening. EXAM: DIGITAL SCREENING BILATERAL MAMMOGRAM WITH IMPLANTS, CAD AND TOMOSYNTHESIS TECHNIQUE: Bilateral screening digital craniocaudal and mediolateral oblique mammograms were obtained. Bilateral screening digital breast  tomosynthesis was performed. The images were evaluated with computer-aided detection. Standard and/or implant displaced views were performed. COMPARISON:  Previous exam(s). ACR Breast Density Category b: There are scattered areas of fibroglandular density. FINDINGS: The patient has retroglandular implants. There are no findings suspicious for malignancy. IMPRESSION: No mammographic evidence of malignancy. A result letter of this screening mammogram will be mailed directly to the patient. RECOMMENDATION: Screening mammogram in one year. (Code:SM-B-01Y) BI-RADS CATEGORY  1:  Negative. Electronically Signed   By: Lillia Mountain M.D.   On: 08/31/2021 11:22      Assessment & Plan:   Problem List Items Addressed This Visit     Breast implant status    Has breast implants.  Request referral back to her surgeon for evaluation.  Concerned if implants could be contributing to any of her current medical issues.        Relevant Orders   Ambulatory referral to Plastic Surgery   Colon cancer screening    Colonoscopy 06/19/21 - f/u colonoscopy in 3 years       Hypercholesterolemia - Primary    Continue crestor.  Low cholesterol diet and exercise.  Follow lipid panel and liver function tests.        Relevant Orders   Basic metabolic panel   Hepatic function panel   Lipid panel   Rheumatoid arthritis ([place])    Followed by Dr Jefm Bryant.  On cimzia.  Overall has done well on this medication.  Follow.       Stress    Continues on wellbutrin.  Increased  stress as outlined.  She is seeing a therapist.  Her husband is now seeing a therapist as well.  Does not feel needs any further intervention at this time.  Follow.       Thrombocytosis    Platelet count 09/01/21 wnl.          Einar Pheasant, MD

## 2021-09-05 ENCOUNTER — Encounter: Payer: Self-pay | Admitting: Internal Medicine

## 2021-09-05 DIAGNOSIS — Z9882 Breast implant status: Secondary | ICD-10-CM | POA: Insufficient documentation

## 2021-09-05 NOTE — Assessment & Plan Note (Signed)
Colonoscopy 06/19/21 - f/u colonoscopy in 3 years  

## 2021-09-05 NOTE — Assessment & Plan Note (Signed)
Continues on wellbutrin.  Increased stress as outlined.  She is seeing a therapist.  Her husband is now seeing a therapist as well.  Does not feel needs any further intervention at this time.  Follow.

## 2021-09-05 NOTE — Assessment & Plan Note (Signed)
Continue crestor.  Low cholesterol diet and exercise. Follow lipid panel and liver function tests.   

## 2021-09-05 NOTE — Assessment & Plan Note (Signed)
Platelet count 09/01/21 wnl.

## 2021-09-05 NOTE — Assessment & Plan Note (Signed)
Has breast implants.  Request referral back to her surgeon for evaluation.  Concerned if implants could be contributing to any of her current medical issues.

## 2021-09-05 NOTE — Assessment & Plan Note (Signed)
Followed by Dr Jefm Bryant.  On cimzia.  Overall has done well on this medication.  Follow.

## 2021-09-09 ENCOUNTER — Ambulatory Visit (INDEPENDENT_AMBULATORY_CARE_PROVIDER_SITE_OTHER): Payer: 59

## 2021-09-09 ENCOUNTER — Other Ambulatory Visit: Payer: Self-pay

## 2021-09-09 DIAGNOSIS — F411 Generalized anxiety disorder: Secondary | ICD-10-CM | POA: Diagnosis not present

## 2021-09-09 DIAGNOSIS — E538 Deficiency of other specified B group vitamins: Secondary | ICD-10-CM | POA: Diagnosis not present

## 2021-09-09 MED ORDER — CYANOCOBALAMIN 1000 MCG/ML IJ SOLN
1000.0000 ug | Freq: Once | INTRAMUSCULAR | Status: AC
Start: 2021-09-09 — End: 2021-09-09
  Administered 2021-09-09: 11:00:00 1000 ug via INTRAMUSCULAR

## 2021-09-09 NOTE — Progress Notes (Signed)
Patient presented for B 12 injection to left deltoid, patient voiced no concerns nor showed any signs of distress during injection. 

## 2021-09-13 MED FILL — Rosuvastatin Calcium Tab 10 MG: ORAL | 90 days supply | Qty: 90 | Fill #1 | Status: AC

## 2021-09-14 ENCOUNTER — Other Ambulatory Visit: Payer: Self-pay

## 2021-09-15 ENCOUNTER — Other Ambulatory Visit: Payer: Self-pay

## 2021-09-24 DIAGNOSIS — M25571 Pain in right ankle and joints of right foot: Secondary | ICD-10-CM | POA: Diagnosis not present

## 2021-09-24 DIAGNOSIS — M0609 Rheumatoid arthritis without rheumatoid factor, multiple sites: Secondary | ICD-10-CM | POA: Diagnosis not present

## 2021-09-24 DIAGNOSIS — F411 Generalized anxiety disorder: Secondary | ICD-10-CM | POA: Diagnosis not present

## 2021-09-27 ENCOUNTER — Other Ambulatory Visit: Payer: Self-pay | Admitting: Internal Medicine

## 2021-09-28 ENCOUNTER — Other Ambulatory Visit: Payer: Self-pay

## 2021-09-28 DIAGNOSIS — K6289 Other specified diseases of anus and rectum: Secondary | ICD-10-CM | POA: Diagnosis not present

## 2021-09-29 NOTE — Telephone Encounter (Signed)
Last refill 08/29/21 and last  OV 09/04/21 patient FU scheduled for 3/23. Okay to fill alprazolam?

## 2021-09-30 ENCOUNTER — Other Ambulatory Visit: Payer: Self-pay

## 2021-09-30 MED ORDER — ALPRAZOLAM 0.25 MG PO TABS
ORAL_TABLET | ORAL | 0 refills | Status: DC
  Filled 2021-09-30: qty 30, 30d supply, fill #0

## 2021-10-01 DIAGNOSIS — F411 Generalized anxiety disorder: Secondary | ICD-10-CM | POA: Diagnosis not present

## 2021-10-08 DIAGNOSIS — M25571 Pain in right ankle and joints of right foot: Secondary | ICD-10-CM | POA: Diagnosis not present

## 2021-10-12 ENCOUNTER — Other Ambulatory Visit: Payer: Self-pay

## 2021-10-13 ENCOUNTER — Ambulatory Visit (INDEPENDENT_AMBULATORY_CARE_PROVIDER_SITE_OTHER): Payer: 59

## 2021-10-13 ENCOUNTER — Other Ambulatory Visit: Payer: Self-pay

## 2021-10-13 DIAGNOSIS — E538 Deficiency of other specified B group vitamins: Secondary | ICD-10-CM

## 2021-10-13 MED ORDER — CYANOCOBALAMIN 1000 MCG/ML IJ SOLN
1000.0000 ug | Freq: Once | INTRAMUSCULAR | Status: AC
  Administered 2021-10-13: 12:00:00 1000 ug via INTRAMUSCULAR

## 2021-10-13 NOTE — Progress Notes (Signed)
Tamara Shah presents today for injection per MD orders. B12 injection administered IM in left Upper Arm. Administration without incident. Patient tolerated well.   Deisi Salonga, cma 

## 2021-10-15 DIAGNOSIS — F411 Generalized anxiety disorder: Secondary | ICD-10-CM | POA: Diagnosis not present

## 2021-10-22 DIAGNOSIS — F411 Generalized anxiety disorder: Secondary | ICD-10-CM | POA: Diagnosis not present

## 2021-10-25 ENCOUNTER — Other Ambulatory Visit: Payer: Self-pay | Admitting: Internal Medicine

## 2021-10-26 ENCOUNTER — Other Ambulatory Visit: Payer: Self-pay

## 2021-10-26 MED ORDER — FLUTICASONE PROPIONATE 50 MCG/ACT NA SUSP
2.0000 | Freq: Every day | NASAL | 2 refills | Status: DC
  Filled 2021-10-26: qty 16, 30d supply, fill #0
  Filled 2021-11-29: qty 16, 30d supply, fill #1
  Filled 2022-01-10: qty 16, 30d supply, fill #2

## 2021-11-01 ENCOUNTER — Other Ambulatory Visit: Payer: Self-pay | Admitting: Internal Medicine

## 2021-11-01 MED ORDER — GABAPENTIN 300 MG PO CAPS
ORAL_CAPSULE | ORAL | 1 refills | Status: DC
  Filled 2021-11-01: qty 90, 30d supply, fill #0
  Filled 2021-11-29: qty 90, 30d supply, fill #1

## 2021-11-02 ENCOUNTER — Other Ambulatory Visit: Payer: Self-pay

## 2021-11-03 DIAGNOSIS — M069 Rheumatoid arthritis, unspecified: Secondary | ICD-10-CM | POA: Diagnosis not present

## 2021-11-05 DIAGNOSIS — F411 Generalized anxiety disorder: Secondary | ICD-10-CM | POA: Diagnosis not present

## 2021-11-16 ENCOUNTER — Other Ambulatory Visit: Payer: Self-pay | Admitting: Internal Medicine

## 2021-11-16 ENCOUNTER — Other Ambulatory Visit: Payer: Self-pay

## 2021-11-16 MED ORDER — FERROUS SULFATE 325 (65 FE) MG PO TABS
325.0000 mg | ORAL_TABLET | Freq: Every day | ORAL | 1 refills | Status: DC
  Filled 2021-11-16: qty 30, 30d supply, fill #0

## 2021-11-16 MED ORDER — BUPROPION HCL ER (SR) 150 MG PO TB12
150.0000 mg | ORAL_TABLET | Freq: Two times a day (BID) | ORAL | 1 refills | Status: DC
  Filled 2021-11-16: qty 180, 90d supply, fill #0
  Filled 2022-02-14: qty 180, 90d supply, fill #1

## 2021-11-17 ENCOUNTER — Ambulatory Visit (INDEPENDENT_AMBULATORY_CARE_PROVIDER_SITE_OTHER): Payer: 59

## 2021-11-17 ENCOUNTER — Other Ambulatory Visit: Payer: Self-pay

## 2021-11-17 DIAGNOSIS — E538 Deficiency of other specified B group vitamins: Secondary | ICD-10-CM | POA: Diagnosis not present

## 2021-11-17 MED ORDER — CYANOCOBALAMIN 1000 MCG/ML IJ SOLN
1000.0000 ug | Freq: Once | INTRAMUSCULAR | Status: AC
  Administered 2021-11-17: 1000 ug via INTRAMUSCULAR

## 2021-11-17 NOTE — Progress Notes (Signed)
Patient presented for B 12 injection to right deltoid, patient voiced no concerns nor showed any signs of distress during injection. 

## 2021-11-19 DIAGNOSIS — F411 Generalized anxiety disorder: Secondary | ICD-10-CM | POA: Diagnosis not present

## 2021-11-26 DIAGNOSIS — F411 Generalized anxiety disorder: Secondary | ICD-10-CM | POA: Diagnosis not present

## 2021-11-29 ENCOUNTER — Other Ambulatory Visit: Payer: Self-pay | Admitting: Internal Medicine

## 2021-11-30 ENCOUNTER — Other Ambulatory Visit: Payer: Self-pay

## 2021-11-30 MED ORDER — ALPRAZOLAM 0.25 MG PO TABS
ORAL_TABLET | ORAL | 0 refills | Status: DC
  Filled 2021-11-30: qty 30, 30d supply, fill #0

## 2021-12-01 DIAGNOSIS — M069 Rheumatoid arthritis, unspecified: Secondary | ICD-10-CM | POA: Diagnosis not present

## 2021-12-04 ENCOUNTER — Other Ambulatory Visit (INDEPENDENT_AMBULATORY_CARE_PROVIDER_SITE_OTHER): Payer: 59

## 2021-12-04 ENCOUNTER — Other Ambulatory Visit: Payer: Self-pay

## 2021-12-04 DIAGNOSIS — E7800 Pure hypercholesterolemia, unspecified: Secondary | ICD-10-CM

## 2021-12-04 LAB — HEPATIC FUNCTION PANEL
ALT: 14 U/L (ref 0–35)
AST: 16 U/L (ref 0–37)
Albumin: 4.3 g/dL (ref 3.5–5.2)
Alkaline Phosphatase: 56 U/L (ref 39–117)
Bilirubin, Direct: 0.1 mg/dL (ref 0.0–0.3)
Total Bilirubin: 0.5 mg/dL (ref 0.2–1.2)
Total Protein: 6.5 g/dL (ref 6.0–8.3)

## 2021-12-04 LAB — BASIC METABOLIC PANEL
BUN: 14 mg/dL (ref 6–23)
CO2: 29 mEq/L (ref 19–32)
Calcium: 9.1 mg/dL (ref 8.4–10.5)
Chloride: 106 mEq/L (ref 96–112)
Creatinine, Ser: 0.89 mg/dL (ref 0.40–1.20)
GFR: 75.42 mL/min (ref >60.00)
Glucose, Bld: 81 mg/dL (ref 70–99)
Potassium: 3.5 mEq/L (ref 3.5–5.1)
Sodium: 141 mEq/L (ref 135–145)

## 2021-12-04 LAB — LIPID PANEL
Cholesterol: 158 mg/dL (ref 0–200)
HDL: 55.8 mg/dL (ref >39.00)
LDL Cholesterol: 72 mg/dL (ref 0–99)
NonHDL: 102.42
Total CHOL/HDL Ratio: 3
Triglycerides: 152 mg/dL — ABNORMAL HIGH (ref 0.0–149.0)
VLDL: 30.4 mg/dL (ref 0.0–40.0)

## 2021-12-06 MED FILL — Rosuvastatin Calcium Tab 10 MG: ORAL | 90 days supply | Qty: 90 | Fill #2 | Status: AC

## 2021-12-07 ENCOUNTER — Other Ambulatory Visit: Payer: Self-pay

## 2021-12-08 DIAGNOSIS — F411 Generalized anxiety disorder: Secondary | ICD-10-CM | POA: Diagnosis not present

## 2021-12-11 ENCOUNTER — Telehealth (INDEPENDENT_AMBULATORY_CARE_PROVIDER_SITE_OTHER): Payer: 59 | Admitting: Internal Medicine

## 2021-12-11 DIAGNOSIS — E7800 Pure hypercholesterolemia, unspecified: Secondary | ICD-10-CM | POA: Diagnosis not present

## 2021-12-11 DIAGNOSIS — D649 Anemia, unspecified: Secondary | ICD-10-CM | POA: Diagnosis not present

## 2021-12-11 DIAGNOSIS — M069 Rheumatoid arthritis, unspecified: Secondary | ICD-10-CM

## 2021-12-11 DIAGNOSIS — Z9109 Other allergy status, other than to drugs and biological substances: Secondary | ICD-10-CM | POA: Diagnosis not present

## 2021-12-11 DIAGNOSIS — F439 Reaction to severe stress, unspecified: Secondary | ICD-10-CM

## 2021-12-11 DIAGNOSIS — Z1211 Encounter for screening for malignant neoplasm of colon: Secondary | ICD-10-CM | POA: Diagnosis not present

## 2021-12-11 DIAGNOSIS — D75839 Thrombocytosis, unspecified: Secondary | ICD-10-CM

## 2021-12-11 NOTE — Progress Notes (Signed)
[name], female   DOB: [birth year], 51 y.o.   MRN: [id number] ? ? ?Virtual Visit via video Note ? ?This visit type was conducted due to national recommendations for restrictions regarding the COVID-19 pandemic (e.g. social distancing).  This format is felt to be most appropriate for this patient at this time.  All issues noted in this document were discussed and addressed.  No physical exam was performed (except for noted visual exam findings with Video Visits).  ? ?I connected with Arnisha Laffoon by a video enabled telemedicine application and verified that I am speaking with the correct person using two identifiers. ?Location patient: home ?Location provider: work  ?Persons participating in the virtual visit: patient, provider ? ?The limitations, risks, security and privacy concerns of performing an evaluation and management service by video and the availability of in person appointments have been discussed.  It has also been discussed with the patient that there may be a patient responsible charge related to this service. The patient expressed understanding and agreed to proceed. ? ? ?Reason for visit: follow up appt ? ?HPI: ?Scheduled for f/u regarding her blood pressure, increased stress and RA.  She changed to virtual visit because her son and his friend have diarrhea and she woke with sore throat and congestion.  States noticed sore throat and raspy voice starting this am.  Some headache.  Took tylenol.  Dry throat.  Clearing her throat.  No chest congestion, chest pain/tightness or sob.  No nausea, vomiting or diarrhea.  No fever. Taking flonase and zyrtec.  Drainage.  Discussed saline nasal spray.  Increased stress - family.  Home schooling her daughter.  Overall appears to be handling things relatively well.  Seeing Dr Posey Pronto for her arthritis.   ? ? ?ROS: See pertinent positives and negatives per HPI. ? ?Past Medical History:  ?Diagnosis Date  ? Allergy   ? Blood in stool   ? H/O  ?  Complication of anesthesia   ? History of chicken pox   ? Hx: UTI (urinary tract infection)   ? Hyperlipidemia   ? PONV (postoperative nausea and vomiting)   ? ? ?Past Surgical History:  ?Procedure Laterality Date  ? AUGMENTATION MAMMAPLASTY  2001  ? CESAREAN SECTION  2007 and 2009  ? COLONOSCOPY WITH PROPOFOL N/A 06/19/2021  ? Procedure: COLONOSCOPY WITH PROPOFOL;  Surgeon: Virgel Manifold, MD;  Location: [facility] ENDOSCOPY;  Service: Endoscopy;  Laterality: N/A;  ? tummy tuck  2001  ? ? ?Family History  ?Problem Relation Age of Onset  ? Healthy Mother   ? Hypertension Mother   ? Cancer Father   ?     non hogkins lymphoma  ? Atrial fibrillation Father   ? Heart disease Father   ? Hypertension Father   ? Arthritis Sister   ? Asthma Sister   ? Hypertension Sister   ? Healthy Daughter   ? Healthy Son   ? Alzheimer's disease Maternal Grandmother   ? Healthy Sister   ? Arthritis Maternal Grandfather   ? Colon cancer Paternal Uncle 56  ? Breast cancer Neg Hx   ? ? ?SOCIAL HX: reviewed.  ? ? ?Current Outpatient Medications:  ?  acetaminophen (TYLENOL) 500 MG tablet, Take 500 mg by mouth every 6 (six) hours as needed., Disp: , Rfl:  ?  ALPRAZolam (XANAX) 0.25 MG tablet, TAKE 1 TABLET BY MOUTH ONCE DAILY AS NEEDED FOR ANXIETY, Disp: 30 tablet, Rfl: 0 ?  buPROPion (WELLBUTRIN SR) 150 MG  12 hr tablet, Take 1 tablet (150 mg total) by mouth 2 (two) times daily., Disp: 180 tablet, Rfl: 1 ?  Certolizumab Pegol 2 X 200 MG/ML PSKT, Inject into the skin every 30 (thirty) days., Disp: , Rfl:  ?  cetirizine (ZYRTEC) 10 MG tablet, Take 10 mg by mouth daily., Disp: , Rfl:  ?  COVID-19 mRNA bivalent vaccine, Pfizer, (PFIZER COVID-19 VAC BIVALENT) injection, Inject into the muscle., Disp: 0.3 mL, Rfl: 0 ?  famotidine (PEPCID) 20 MG tablet, Take 20 mg by mouth 2 (two) times daily., Disp: , Rfl:  ?  ferrous sulfate 325 (65 FE) MG tablet, Take 1 tablet (325 mg total) by mouth daily with breakfast., Disp: 30 tablet, Rfl: 1 ?  fluticasone  (FLONASE) 50 MCG/ACT nasal spray, USE 2 SPRAYS IN EACH NOSTRIL ONCE DAILY, Disp: 16 g, Rfl: 2 ?  gabapentin (NEURONTIN) 300 MG capsule, TAKE 3 CAPSULES BY MOUTH EVERY EVENING, Disp: 90 capsule, Rfl: 1 ?  hydroxychloroquine (PLAQUENIL) 200 MG tablet, TAKE 1 TABLET BY MOUTH TWICE DAILY FOR INFLAMMATORY ARTHRITIS, Disp: 180 tablet, Rfl: 1 ?  Melatonin 10 MG TABS, Take 10 mg by mouth at bedtime., Disp: , Rfl:  ?  meloxicam (MOBIC) 7.5 MG tablet, TAKE 1 TABLET BY MOUTH TWICE DAILY WITH MEALS, Disp: 60 tablet, Rfl: 3 ?  rosuvastatin (CRESTOR) 10 MG tablet, TAKE 1 TABLET BY MOUTH DAILY, Disp: 90 tablet, Rfl: 2 ? ?EXAM: ? ?GENERAL: alert, oriented, appears well and in no acute distress ? ?HEENT: atraumatic, conjunttiva clear, no obvious abnormalities on inspection of external nose and ears ? ?NECK: normal movements of the head and neck ? ?LUNGS: on inspection no signs of respiratory distress, breathing rate appears normal, no obvious gross SOB, gasping or wheezing ? ?CV: no obvious cyanosis ? ?PSYCH/NEURO: pleasant and cooperative, no obvious depression or anxiety, speech and thought processing grossly intact ? ?ASSESSMENT AND PLAN: ? ?Discussed the following assessment and plan: ? ?Problem List Items Addressed This Visit   ? ? Anemia  ?  Follow cbc.  ?  ?  ? Colon cancer screening  ?  Colonoscopy 06/19/21 - f/u colonoscopy in 3 years  ?  ?  ? Environmental allergies  ?  Congestion, sore throat, drainage and raspy voice as outlined.  Saline nasal spray, flonase and antihistamine as directed.  Follow.  Call with update.  She will take home covid test.  If negative, repeat in a couple of days.  Discussed quarantine recommendations.  ?  ?  ? Hypercholesterolemia  ?  Continue crestor.  Low cholesterol diet and exercise.  Follow lipid panel and liver function tests.   ?  ?  ? Rheumatoid arthritis ([place])  ?  Followed by Dr Jefm Bryant.  On cimzia.  Overall has done well on this medication.  Follow.  ?  ?  ? Stress  ?  Continues on  wellbutrin.  Increased stress as outlined.  She is seeing a therapist.  Her husband is now seeing a therapist as well.  Does not feel needs any further intervention at this time.  Follow.  ?  ?  ? Thrombocytosis  ?  Follow cbc.  ?  ?  ? ? ?Return in about 8 weeks (around 02/05/2022) for follow up appt (27mn). ?  ?I discussed the assessment and treatment plan with the patient. The patient was provided an opportunity to ask questions and all were answered. The patient agreed with the plan and demonstrated an understanding of the instructions. ?  ?The  patient was advised to call back or seek an in-person evaluation if the symptoms worsen or if the condition fails to improve as anticipated. ? ? ?Einar Pheasant, MD   ?

## 2021-12-12 ENCOUNTER — Encounter: Payer: Self-pay | Admitting: Internal Medicine

## 2021-12-12 NOTE — Assessment & Plan Note (Signed)
Follow cbc.  

## 2021-12-12 NOTE — Assessment & Plan Note (Signed)
Colonoscopy 06/19/21 - f/u colonoscopy in 3 years  

## 2021-12-12 NOTE — Assessment & Plan Note (Signed)
Congestion, sore throat, drainage and raspy voice as outlined.  Saline nasal spray, flonase and antihistamine as directed.  Follow.  Call with update.  She will take home covid test.  If negative, repeat in a couple of days.  Discussed quarantine recommendations.  ?

## 2021-12-12 NOTE — Assessment & Plan Note (Signed)
Followed by Dr Jefm Bryant.  On cimzia.  Overall has done well on this medication.  Follow.  ?

## 2021-12-12 NOTE — Assessment & Plan Note (Signed)
Continues on wellbutrin.  Increased stress as outlined.  She is seeing a therapist.  Her husband is now seeing a therapist as well.  Does not feel needs any further intervention at this time.  Follow.  ?

## 2021-12-12 NOTE — Assessment & Plan Note (Signed)
Continue crestor.  Low cholesterol diet and exercise. Follow lipid panel and liver function tests.   

## 2021-12-14 ENCOUNTER — Other Ambulatory Visit: Payer: Self-pay

## 2021-12-14 ENCOUNTER — Encounter: Payer: Self-pay | Admitting: Internal Medicine

## 2021-12-14 MED ORDER — MELOXICAM 7.5 MG PO TABS
7.5000 mg | ORAL_TABLET | Freq: Two times a day (BID) | ORAL | 3 refills | Status: DC
  Filled 2021-12-14: qty 60, 30d supply, fill #0
  Filled 2022-01-10: qty 60, 30d supply, fill #1
  Filled 2022-02-14: qty 60, 30d supply, fill #2
  Filled 2022-03-16: qty 60, 30d supply, fill #3

## 2021-12-15 DIAGNOSIS — F411 Generalized anxiety disorder: Secondary | ICD-10-CM | POA: Diagnosis not present

## 2021-12-16 ENCOUNTER — Other Ambulatory Visit: Payer: Self-pay

## 2021-12-16 ENCOUNTER — Ambulatory Visit (INDEPENDENT_AMBULATORY_CARE_PROVIDER_SITE_OTHER): Payer: 59

## 2021-12-16 DIAGNOSIS — E538 Deficiency of other specified B group vitamins: Secondary | ICD-10-CM

## 2021-12-16 MED ORDER — CYANOCOBALAMIN 1000 MCG/ML IJ SOLN
1000.0000 ug | Freq: Once | INTRAMUSCULAR | Status: AC
  Administered 2021-12-16: 1000 ug via INTRAMUSCULAR

## 2021-12-16 NOTE — Progress Notes (Signed)
Pt arrived for B12 injection, given in L deltoid. Pt tolerated injection well showed no signs of distress nor voiced any concerns.  ?

## 2021-12-22 DIAGNOSIS — F411 Generalized anxiety disorder: Secondary | ICD-10-CM | POA: Diagnosis not present

## 2021-12-24 DIAGNOSIS — M170 Bilateral primary osteoarthritis of knee: Secondary | ICD-10-CM | POA: Diagnosis not present

## 2021-12-27 ENCOUNTER — Other Ambulatory Visit: Payer: Self-pay | Admitting: Family

## 2021-12-28 ENCOUNTER — Other Ambulatory Visit: Payer: Self-pay | Admitting: Family

## 2021-12-28 ENCOUNTER — Other Ambulatory Visit: Payer: Self-pay

## 2021-12-29 ENCOUNTER — Other Ambulatory Visit: Payer: Self-pay | Admitting: Internal Medicine

## 2021-12-29 ENCOUNTER — Other Ambulatory Visit: Payer: Self-pay

## 2021-12-29 DIAGNOSIS — M159 Polyosteoarthritis, unspecified: Secondary | ICD-10-CM | POA: Diagnosis not present

## 2021-12-29 DIAGNOSIS — M0609 Rheumatoid arthritis without rheumatoid factor, multiple sites: Secondary | ICD-10-CM | POA: Diagnosis not present

## 2021-12-29 DIAGNOSIS — Z7960 Long term (current) use of unspecified immunomodulators and immunosuppressants: Secondary | ICD-10-CM | POA: Diagnosis not present

## 2021-12-29 MED FILL — Gabapentin Cap 300 MG: ORAL | 30 days supply | Qty: 90 | Fill #0 | Status: AC

## 2021-12-31 ENCOUNTER — Ambulatory Visit: Payer: 59 | Admitting: Internal Medicine

## 2021-12-31 ENCOUNTER — Encounter: Payer: Self-pay | Admitting: Internal Medicine

## 2021-12-31 DIAGNOSIS — F439 Reaction to severe stress, unspecified: Secondary | ICD-10-CM

## 2021-12-31 DIAGNOSIS — R000 Tachycardia, unspecified: Secondary | ICD-10-CM | POA: Diagnosis not present

## 2021-12-31 DIAGNOSIS — M069 Rheumatoid arthritis, unspecified: Secondary | ICD-10-CM | POA: Diagnosis not present

## 2021-12-31 DIAGNOSIS — D649 Anemia, unspecified: Secondary | ICD-10-CM | POA: Diagnosis not present

## 2021-12-31 DIAGNOSIS — E7800 Pure hypercholesterolemia, unspecified: Secondary | ICD-10-CM

## 2021-12-31 DIAGNOSIS — D75839 Thrombocytosis, unspecified: Secondary | ICD-10-CM

## 2021-12-31 DIAGNOSIS — M25569 Pain in unspecified knee: Secondary | ICD-10-CM

## 2021-12-31 NOTE — Progress Notes (Signed)
[name], female   DOB: Apr 08, 1971, 51 y.o.   MRN: [id number] ? ? ?Subjective:  ? ? [name], female    DOB: [birth year], 50 y.o.   MRN: [id number] ? ?This visit occurred during the SARS-CoV-2 public health emergency.  Safety protocols were in place, including screening questions prior to the visit, additional usage of staff PPE, and extensive cleaning of exam room while observing appropriate contact time as indicated for disinfecting solutions.  ? ?Patient here for a scheduled follow up.  ? ?Chief Complaint  ?Patient presents with  ? Follow-up  ?  Follow up for increased stress.  ? .  ? ?HPI ?Sees Dr Posey Pronto for f/u RA.  On cimzia and plaquenil.  Seeing ortho for f/u of her knees.  Due to f/u tomorrow.  Increased stress.  Persistent.  Discussed.  Heart rate noted to be elevated on arrival today.  Decreased now.  Does notice intermittent increased heart rate.  No chest pain.  Breathing stable.  No acid reflux reported.  No abdominal pain.  Bowels moving.   ? ? ?Past Medical History:  ?Diagnosis Date  ? Allergy   ? Blood in stool   ? H/O  ? Complication of anesthesia   ? History of chicken pox   ? Hx: UTI (urinary tract infection)   ? Hyperlipidemia   ? PONV (postoperative nausea and vomiting)   ? ?Past Surgical History:  ?Procedure Laterality Date  ? AUGMENTATION MAMMAPLASTY  2001  ? CESAREAN SECTION  2007 and 2009  ? COLONOSCOPY WITH PROPOFOL N/A 06/19/2021  ? Procedure: COLONOSCOPY WITH PROPOFOL;  Surgeon: Virgel Manifold, MD;  Location: [facility] ENDOSCOPY;  Service: Endoscopy;  Laterality: N/A;  ? tummy tuck  2001  ? ?Family History  ?Problem Relation Age of Onset  ? Healthy Mother   ? Hypertension Mother   ? Cancer Father   ?     non hogkins lymphoma  ? Atrial fibrillation Father   ? Heart disease Father   ? Hypertension Father   ? Arthritis Sister   ? Asthma Sister   ? Hypertension Sister   ? Healthy Daughter   ? Healthy Son   ? Alzheimer's disease Maternal Grandmother   ? Healthy  Sister   ? Arthritis Maternal Grandfather   ? Colon cancer Paternal Uncle 85  ? Breast cancer Neg Hx   ? ?Social History  ? ?Socioeconomic History  ? Marital status: Married  ?  Spouse name: Not on file  ? Number of children: Not on file  ? Years of education: Not on file  ? Highest education level: Not on file  ?Occupational History  ? Not on file  ?Tobacco Use  ? Smoking status: Never  ? Smokeless tobacco: Never  ?Vaping Use  ? Vaping Use: Never used  ?Substance and Sexual Activity  ? Alcohol use: Yes  ?  Alcohol/week: 0.0 standard drinks  ?  Comment: occasional  ? Drug use: No  ? Sexual activity: Not on file  ?Other Topics Concern  ? Not on file  ?Social History Narrative  ? Not on file  ? ?Social Determinants of Health  ? ?Financial Resource Strain: Not on file  ?Food Insecurity: Not on file  ?Transportation Needs: Not on file  ?Physical Activity: Not on file  ?Stress: Not on file  ?Social Connections: Not on file  ? ? ? ?Review of Systems  ?Constitutional:  Negative for appetite change and unexpected weight change.  ?HENT:  Negative for congestion and sinus pressure.   ?Respiratory:  Negative for cough, chest tightness and shortness of breath.   ?Cardiovascular:  Negative for chest pain and leg swelling.  ?     Intermittent increased heart rate as outlined.    ?Gastrointestinal:  Negative for abdominal pain, diarrhea, nausea and vomiting.  ?Genitourinary:  Negative for difficulty urinating and dysuria.  ?Musculoskeletal:  Negative for joint swelling and myalgias.  ?Skin:  Negative for color change and rash.  ?Neurological:  Negative for dizziness, light-headedness and headaches.  ?Psychiatric/Behavioral:  Negative for agitation and dysphoric mood.   ? ?   ?Objective:  ?  ? ?BP 130/82 (BP Location: Left Arm, Patient Position: Sitting, Cuff Size: Large)   Pulse (!) 147   Temp 98.3 ?F (36.8 ?C) (Oral)   Resp (!) 26   Ht '5\' 3"'$  (1.6 m)   Wt 208 lb 3.2 oz (94.4 kg)   SpO2 97%   BMI 36.88 kg/m?  ?Wt Readings  from Last 3 Encounters:  ?12/31/21 208 lb 3.2 oz (94.4 kg)  ?09/04/21 212 lb (96.2 kg)  ?06/19/21 215 lb 6.9 oz (97.7 kg)  ? ? ?Physical Exam ?Vitals reviewed.  ?Constitutional:   ?   General: She is not in acute distress. ?   Appearance: Normal appearance.  ?HENT:  ?   Head: Normocephalic and atraumatic.  ?   Right Ear: External ear normal.  ?   Left Ear: External ear normal.  ?Eyes:  ?   General: No scleral icterus.    ?   Right eye: No discharge.     ?   Left eye: No discharge.  ?   Conjunctiva/sclera: Conjunctivae normal.  ?Neck:  ?   Thyroid: No thyromegaly.  ?Cardiovascular:  ?   Rate and Rhythm: Normal rate and regular rhythm.  ?   Comments: Heart rate on my check - 90-100.  ?Pulmonary:  ?   Effort: No respiratory distress.  ?   Breath sounds: Normal breath sounds. No wheezing.  ?Abdominal:  ?   General: Bowel sounds are normal.  ?   Palpations: Abdomen is soft.  ?   Tenderness: There is no abdominal tenderness.  ?Musculoskeletal:     ?   General: No swelling or tenderness.  ?   Cervical back: Neck supple. No tenderness.  ?Lymphadenopathy:  ?   Cervical: No cervical adenopathy.  ?Skin: ?   Findings: No erythema or rash.  ?Neurological:  ?   Mental Status: She is alert.  ?Psychiatric:     ?   Mood and Affect: Mood normal.     ?   Behavior: Behavior normal.  ? ? ? ?Outpatient Encounter Medications as of 12/31/2021  ?Medication Sig  ? acetaminophen (TYLENOL) 500 MG tablet Take 500 mg by mouth every 6 (six) hours as needed.  ? ALPRAZolam (XANAX) 0.25 MG tablet TAKE 1 TABLET BY MOUTH ONCE DAILY AS NEEDED FOR ANXIETY  ? buPROPion (WELLBUTRIN SR) 150 MG 12 hr tablet Take 1 tablet (150 mg total) by mouth 2 (two) times daily.  ? Certolizumab Pegol 2 X 200 MG/ML PSKT Inject into the skin every 30 (thirty) days.  ? cetirizine (ZYRTEC) 10 MG tablet Take 10 mg by mouth daily.  ? COVID-19 mRNA bivalent vaccine, Pfizer, (PFIZER COVID-19 VAC BIVALENT) injection Inject into the muscle.  ? famotidine (PEPCID) 20 MG tablet Take  20 mg by mouth 2 (two) times daily.  ? fluticasone (FLONASE) 50 MCG/ACT nasal spray USE 2 SPRAYS IN [facility]  NOSTRIL ONCE DAILY  ? gabapentin (NEURONTIN) 300 MG capsule TAKE 3 CAPSULES BY MOUTH EVERY EVENING  ? hydroxychloroquine (PLAQUENIL) 200 MG tablet TAKE 1 TABLET BY MOUTH TWICE DAILY FOR INFLAMMATORY ARTHRITIS  ? Melatonin 10 MG TABS Take 10 mg by mouth at bedtime.  ? meloxicam (MOBIC) 7.5 MG tablet TAKE 1 TABLET BY MOUTH TWICE DAILY WITH MEALS  ? rosuvastatin (CRESTOR) 10 MG tablet TAKE 1 TABLET BY MOUTH DAILY  ? [DISCONTINUED] ferrous sulfate 325 (65 FE) MG tablet Take 1 tablet (325 mg total) by mouth daily with breakfast.  ? ?No facility-administered encounter medications on file as of 12/31/2021.  ?  ? ?Lab Results  ?Component Value Date  ? WBC 4.6 09/01/2021  ? HGB 13.3 09/01/2021  ? HCT 40.0 09/01/2021  ? PLT 260.0 09/01/2021  ? GLUCOSE 81 12/04/2021  ? CHOL 158 12/04/2021  ? TRIG 152.0 (H) 12/04/2021  ? HDL 55.80 12/04/2021  ? LDLDIRECT 87.0 04/03/2019  ? [place] 72 12/04/2021  ? ALT 14 12/04/2021  ? AST 16 12/04/2021  ? NA 141 12/04/2021  ? K 3.5 12/04/2021  ? CL 106 12/04/2021  ? CREATININE 0.89 12/04/2021  ? BUN 14 12/04/2021  ? CO2 29 12/04/2021  ? TSH 1.78 09/01/2021  ? ? ?MM 3D SCREEN BREAST W/IMPLANT BILATERAL ? ?Result Date: 08/31/2021 ?CLINICAL DATA:  Screening. EXAM: DIGITAL SCREENING BILATERAL MAMMOGRAM WITH IMPLANTS, CAD AND TOMOSYNTHESIS TECHNIQUE: Bilateral screening digital craniocaudal and mediolateral oblique mammograms were obtained. Bilateral screening digital breast tomosynthesis was performed. The images were evaluated with computer-aided detection. Standard and/or implant displaced views were performed. COMPARISON:  Previous exam(s). ACR Breast Density Category b: There are scattered areas of fibroglandular density. FINDINGS: The patient has retroglandular implants. There are no findings suspicious for malignancy. IMPRESSION: No mammographic evidence of malignancy. A result letter of  this screening mammogram will be mailed directly to the patient. RECOMMENDATION: Screening mammogram in one year. (Code:SM-B-01Y) BI-RADS CATEGORY  1:  Negative. Electronically Signed   By: Lillia Mountain M.D.   O

## 2022-01-01 ENCOUNTER — Encounter: Payer: Self-pay | Admitting: Internal Medicine

## 2022-01-01 DIAGNOSIS — M170 Bilateral primary osteoarthritis of knee: Secondary | ICD-10-CM | POA: Diagnosis not present

## 2022-01-03 ENCOUNTER — Encounter: Payer: Self-pay | Admitting: Internal Medicine

## 2022-01-03 NOTE — Assessment & Plan Note (Signed)
Continue crestor.  Low cholesterol diet and exercise. Follow lipid panel and liver function tests.   

## 2022-01-03 NOTE — Assessment & Plan Note (Signed)
Continues on wellbutrin.  Increased stress as outlined.  She is seeing a therapist.  Her husband is now seeing a therapist as well.  Does not feel needs any further intervention at this time.  Follow.  ?

## 2022-01-03 NOTE — Assessment & Plan Note (Signed)
Followed by ortho 

## 2022-01-03 NOTE — Assessment & Plan Note (Signed)
Apparent increased heart rate initially on presentation.  Rechecked by me 90-100.  Attempted EKG - unable to print given problems with EKG machine.  Reviewed monitor.  Heart rate - 80s.  No acute changes noted.  Will get her to return for EKG. Monitor pressures and pulse rate.  Adjust medication if needed.  May need further cardiac w/up.  ?

## 2022-01-03 NOTE — Assessment & Plan Note (Signed)
Follow cbc.  

## 2022-01-03 NOTE — Assessment & Plan Note (Signed)
Followed by Dr Jefm Bryant.  On cimzia and plaquenil.  Overall has done well on this medication.  Follow.  ?

## 2022-01-05 DIAGNOSIS — F411 Generalized anxiety disorder: Secondary | ICD-10-CM | POA: Diagnosis not present

## 2022-01-06 ENCOUNTER — Ambulatory Visit: Payer: 59 | Admitting: Internal Medicine

## 2022-01-06 ENCOUNTER — Encounter: Payer: Self-pay | Admitting: Internal Medicine

## 2022-01-06 VITALS — BP 122/88 | HR 77 | Temp 98.6°F | Ht 63.0 in | Wt 206.6 lb

## 2022-01-06 DIAGNOSIS — Z1211 Encounter for screening for malignant neoplasm of colon: Secondary | ICD-10-CM

## 2022-01-06 DIAGNOSIS — E7800 Pure hypercholesterolemia, unspecified: Secondary | ICD-10-CM | POA: Diagnosis not present

## 2022-01-06 DIAGNOSIS — M069 Rheumatoid arthritis, unspecified: Secondary | ICD-10-CM | POA: Diagnosis not present

## 2022-01-06 DIAGNOSIS — D75839 Thrombocytosis, unspecified: Secondary | ICD-10-CM | POA: Diagnosis not present

## 2022-01-06 DIAGNOSIS — R000 Tachycardia, unspecified: Secondary | ICD-10-CM | POA: Diagnosis not present

## 2022-01-06 DIAGNOSIS — F439 Reaction to severe stress, unspecified: Secondary | ICD-10-CM

## 2022-01-06 NOTE — Progress Notes (Signed)
[name], female   DOB: [birth year], 51 y.o.   MRN: [id number] ? ? ?Subjective:  ? ? [name], female    DOB: Apr 29, 1971, 51 y.o.   MRN: [id number] ? ?This visit occurred during the SARS-CoV-2 public health emergency.  Safety protocols were in place, including screening questions prior to the visit, additional usage of staff PPE, and extensive cleaning of exam room while observing appropriate contact time as indicated for disinfecting solutions.  ? ?Patient here for a scheduled follow up.  ? ?Chief Complaint  ?Patient presents with  ? Follow-up  ?  EKG  ? .  ? ?HPI ?Last visit, increased heart rate.  Unable to get EKG at that visit - machine malfunction.  Increased stress.  Discussed.  Seeing a therapist.  No chest pain.  Some intermittent increased heart rate.  (Reported 150 range last week).  No increased cough or congestion.  No acid reflux.  No abdominal pain.  Bowels moving.  Followed by rheumatology and ortho - knees.  Gelsyn injections.   ? ? ?Past Medical History:  ?Diagnosis Date  ? Allergy   ? Blood in stool   ? H/O  ? Complication of anesthesia   ? History of chicken pox   ? Hx: UTI (urinary tract infection)   ? Hyperlipidemia   ? PONV (postoperative nausea and vomiting)   ? ?Past Surgical History:  ?Procedure Laterality Date  ? AUGMENTATION MAMMAPLASTY  2001  ? CESAREAN SECTION  2007 and 2009  ? COLONOSCOPY WITH PROPOFOL N/A 06/19/2021  ? Procedure: COLONOSCOPY WITH PROPOFOL;  Surgeon: Virgel Manifold, MD;  Location: [facility] ENDOSCOPY;  Service: Endoscopy;  Laterality: N/A;  ? tummy tuck  2001  ? ?Family History  ?Problem Relation Age of Onset  ? Healthy Mother   ? Hypertension Mother   ? Cancer Father   ?     non hogkins lymphoma  ? Atrial fibrillation Father   ? Heart disease Father   ? Hypertension Father   ? Arthritis Sister   ? Asthma Sister   ? Hypertension Sister   ? Healthy Daughter   ? Healthy Son   ? Alzheimer's disease Maternal Grandmother   ? Healthy Sister    ? Arthritis Maternal Grandfather   ? Colon cancer Paternal Uncle 21  ? Breast cancer Neg Hx   ? ?Social History  ? ?Socioeconomic History  ? Marital status: Married  ?  Spouse name: Not on file  ? Number of children: Not on file  ? Years of education: Not on file  ? Highest education level: Not on file  ?Occupational History  ? Not on file  ?Tobacco Use  ? Smoking status: Never  ? Smokeless tobacco: Never  ?Vaping Use  ? Vaping Use: Never used  ?Substance and Sexual Activity  ? Alcohol use: Yes  ?  Alcohol/week: 0.0 standard drinks  ?  Comment: occasional  ? Drug use: No  ? Sexual activity: Not on file  ?Other Topics Concern  ? Not on file  ?Social History Narrative  ? Not on file  ? ?Social Determinants of Health  ? ?Financial Resource Strain: Not on file  ?Food Insecurity: Not on file  ?Transportation Needs: Not on file  ?Physical Activity: Not on file  ?Stress: Not on file  ?Social Connections: Not on file  ? ? ? ?Review of Systems  ?Constitutional:  Negative for appetite change and unexpected weight change.  ?HENT:  Negative for congestion and  sinus pressure.   ?Respiratory:  Negative for cough, chest tightness and shortness of breath.   ?Cardiovascular:  Negative for chest pain and leg swelling.  ?     Intermittent increased heart rate.   ?Gastrointestinal:  Negative for abdominal pain, diarrhea, nausea and vomiting.  ?Genitourinary:  Negative for difficulty urinating and dysuria.  ?Musculoskeletal:  Negative for joint swelling and myalgias.  ?Skin:  Negative for color change and rash.  ?Neurological:  Negative for dizziness, light-headedness and headaches.  ?Psychiatric/Behavioral:  Negative for agitation and dysphoric mood.   ?     Increased stress as outlined.   ? ?   ?Objective:  ?  ? ?BP 122/88 (BP Location: Left Arm, Patient Position: Sitting, Cuff Size: Large)   Pulse 77   Temp 98.6 ?F (37 ?C) (Oral)   Ht '5\' 3"'$  (1.6 m)   Wt 206 lb 9.6 oz (93.7 kg)   SpO2 95%   BMI 36.60 kg/m?  ?Wt Readings from  Last 3 Encounters:  ?01/06/22 206 lb 9.6 oz (93.7 kg)  ?12/31/21 208 lb 3.2 oz (94.4 kg)  ?09/04/21 212 lb (96.2 kg)  ? ? ?Physical Exam ?Vitals reviewed.  ?Constitutional:   ?   General: She is not in acute distress. ?   Appearance: Normal appearance.  ?HENT:  ?   Head: Normocephalic and atraumatic.  ?   Right Ear: External ear normal.  ?   Left Ear: External ear normal.  ?Eyes:  ?   General: No scleral icterus.    ?   Right eye: No discharge.     ?   Left eye: No discharge.  ?   Conjunctiva/sclera: Conjunctivae normal.  ?Neck:  ?   Thyroid: No thyromegaly.  ?Cardiovascular:  ?   Rate and Rhythm: Normal rate and regular rhythm.  ?Pulmonary:  ?   Effort: No respiratory distress.  ?   Breath sounds: Normal breath sounds. No wheezing.  ?Abdominal:  ?   General: Bowel sounds are normal.  ?   Palpations: Abdomen is soft.  ?   Tenderness: There is no abdominal tenderness.  ?Musculoskeletal:     ?   General: No swelling or tenderness.  ?   Cervical back: Neck supple. No tenderness.  ?Lymphadenopathy:  ?   Cervical: No cervical adenopathy.  ?Skin: ?   Findings: No erythema or rash.  ?Neurological:  ?   Mental Status: She is alert.  ?Psychiatric:     ?   Mood and Affect: Mood normal.     ?   Behavior: Behavior normal.  ? ? ? ?Outpatient Encounter Medications as of 01/06/2022  ?Medication Sig  ? acetaminophen (TYLENOL) 500 MG tablet Take 500 mg by mouth every 6 (six) hours as needed.  ? ALPRAZolam (XANAX) 0.25 MG tablet TAKE 1 TABLET BY MOUTH ONCE DAILY AS NEEDED FOR ANXIETY  ? buPROPion (WELLBUTRIN SR) 150 MG 12 hr tablet Take 1 tablet (150 mg total) by mouth 2 (two) times daily.  ? Certolizumab Pegol 2 X 200 MG/ML PSKT Inject into the skin every 30 (thirty) days.  ? cetirizine (ZYRTEC) 10 MG tablet Take 10 mg by mouth daily.  ? COVID-19 mRNA bivalent vaccine, Pfizer, (PFIZER COVID-19 VAC BIVALENT) injection Inject into the muscle.  ? famotidine (PEPCID) 20 MG tablet Take 20 mg by mouth 2 (two) times daily.  ? fluticasone  (FLONASE) 50 MCG/ACT nasal spray USE 2 SPRAYS IN EACH NOSTRIL ONCE DAILY  ? gabapentin (NEURONTIN) 300 MG capsule TAKE 3 CAPSULES BY  MOUTH EVERY EVENING  ? hydroxychloroquine (PLAQUENIL) 200 MG tablet TAKE 1 TABLET BY MOUTH TWICE DAILY FOR INFLAMMATORY ARTHRITIS  ? Melatonin 10 MG TABS Take 10 mg by mouth at bedtime.  ? meloxicam (MOBIC) 7.5 MG tablet TAKE 1 TABLET BY MOUTH TWICE DAILY WITH MEALS  ? rosuvastatin (CRESTOR) 10 MG tablet TAKE 1 TABLET BY MOUTH DAILY  ? ?No facility-administered encounter medications on file as of 01/06/2022.  ?  ? ?Lab Results  ?Component Value Date  ? WBC 4.6 09/01/2021  ? HGB 13.3 09/01/2021  ? HCT 40.0 09/01/2021  ? PLT 260.0 09/01/2021  ? GLUCOSE 81 12/04/2021  ? CHOL 158 12/04/2021  ? TRIG 152.0 (H) 12/04/2021  ? HDL 55.80 12/04/2021  ? LDLDIRECT 87.0 04/03/2019  ? [place] 72 12/04/2021  ? ALT 14 12/04/2021  ? AST 16 12/04/2021  ? NA 141 12/04/2021  ? K 3.5 12/04/2021  ? CL 106 12/04/2021  ? CREATININE 0.89 12/04/2021  ? BUN 14 12/04/2021  ? CO2 29 12/04/2021  ? TSH 1.78 09/01/2021  ? ? ?MM 3D SCREEN BREAST W/IMPLANT BILATERAL ? ?Result Date: 08/31/2021 ?CLINICAL DATA:  Screening. EXAM: DIGITAL SCREENING BILATERAL MAMMOGRAM WITH IMPLANTS, CAD AND TOMOSYNTHESIS TECHNIQUE: Bilateral screening digital craniocaudal and mediolateral oblique mammograms were obtained. Bilateral screening digital breast tomosynthesis was performed. The images were evaluated with computer-aided detection. Standard and/or implant displaced views were performed. COMPARISON:  Previous exam(s). ACR Breast Density Category b: There are scattered areas of fibroglandular density. FINDINGS: The patient has retroglandular implants. There are no findings suspicious for malignancy. IMPRESSION: No mammographic evidence of malignancy. A result letter of this screening mammogram will be mailed directly to the patient. RECOMMENDATION: Screening mammogram in one year. (Code:SM-B-01Y) BI-RADS CATEGORY  1:  Negative.  Electronically Signed   By: Lillia Mountain M.D.   On: 08/31/2021 11:22 ? ? ?   ?Assessment & Plan:  ? ?Problem List Items Addressed This Visit   ? ? Colon cancer screening  ?  Colonoscopy 06/19/21 - f/u colonoscopy in 3 y

## 2022-01-08 DIAGNOSIS — M170 Bilateral primary osteoarthritis of knee: Secondary | ICD-10-CM | POA: Diagnosis not present

## 2022-01-10 ENCOUNTER — Other Ambulatory Visit: Payer: Self-pay

## 2022-01-10 ENCOUNTER — Encounter: Payer: Self-pay | Admitting: Internal Medicine

## 2022-01-10 NOTE — Assessment & Plan Note (Addendum)
Continues on wellbutrin.  Increased stress as outlined.  She is seeing a therapist.  Does not feel needs any further intervention at this time.  Follow.  ?

## 2022-01-10 NOTE — Assessment & Plan Note (Signed)
Follow cbc.  

## 2022-01-10 NOTE — Assessment & Plan Note (Signed)
Seeing Dr Patel.   On cimzia and plaquenil.  Overall has done well on this medication.  Follow.  

## 2022-01-10 NOTE — Assessment & Plan Note (Signed)
Intermittent increased heart rate.  Reportedly 140-150 last week.  Discussed further w/up and evaluation.  EKG today SR with no acute ischemic changes. Ventricular rate 72.  Refer to cardiology for further evaluation and  Question of need for monitor, echo, etc.   ?

## 2022-01-10 NOTE — Assessment & Plan Note (Signed)
Continue crestor.  Low cholesterol diet and exercise. Follow lipid panel and liver function tests.   

## 2022-01-10 NOTE — Assessment & Plan Note (Signed)
Colonoscopy 06/19/21 - f/u colonoscopy in 3 years  

## 2022-01-11 ENCOUNTER — Other Ambulatory Visit: Payer: Self-pay

## 2022-01-11 MED ORDER — HYDROXYCHLOROQUINE SULFATE 200 MG PO TABS
ORAL_TABLET | ORAL | 1 refills | Status: DC
  Filled 2022-01-11: qty 180, 90d supply, fill #0
  Filled 2022-04-11: qty 180, 90d supply, fill #1

## 2022-01-12 DIAGNOSIS — F411 Generalized anxiety disorder: Secondary | ICD-10-CM | POA: Diagnosis not present

## 2022-01-19 ENCOUNTER — Ambulatory Visit (INDEPENDENT_AMBULATORY_CARE_PROVIDER_SITE_OTHER): Payer: 59 | Admitting: *Deleted

## 2022-01-19 DIAGNOSIS — E538 Deficiency of other specified B group vitamins: Secondary | ICD-10-CM

## 2022-01-19 DIAGNOSIS — F411 Generalized anxiety disorder: Secondary | ICD-10-CM | POA: Diagnosis not present

## 2022-01-19 MED ORDER — CYANOCOBALAMIN 1000 MCG/ML IJ SOLN
1000.0000 ug | Freq: Once | INTRAMUSCULAR | Status: AC
  Administered 2022-01-19: 1000 ug via INTRAMUSCULAR

## 2022-01-19 NOTE — Progress Notes (Signed)
Patient presented for B 12 injection to left deltoid, patient voiced no concerns nor showed any signs of distress during injection. 

## 2022-01-20 ENCOUNTER — Telehealth: Payer: Self-pay | Admitting: Internal Medicine

## 2022-01-20 NOTE — Telephone Encounter (Signed)
Patient has a lab appt 01/27/2022, there are no orders in. ?

## 2022-01-21 NOTE — Telephone Encounter (Signed)
Appt cancelled - lm for pt to cb ?

## 2022-01-21 NOTE — Telephone Encounter (Signed)
Please call and notify pt that she is scheduled for a fasting lab appt 01/27/22 .  She does not need to keep this appt.  She just had fasting labs in March. Please cancel and notify pt - not needed.  ?

## 2022-01-26 DIAGNOSIS — M0609 Rheumatoid arthritis without rheumatoid factor, multiple sites: Secondary | ICD-10-CM | POA: Diagnosis not present

## 2022-01-26 DIAGNOSIS — F411 Generalized anxiety disorder: Secondary | ICD-10-CM | POA: Diagnosis not present

## 2022-01-27 ENCOUNTER — Other Ambulatory Visit: Payer: 59

## 2022-01-29 ENCOUNTER — Ambulatory Visit (INDEPENDENT_AMBULATORY_CARE_PROVIDER_SITE_OTHER): Payer: 59 | Admitting: Internal Medicine

## 2022-01-29 ENCOUNTER — Other Ambulatory Visit (HOSPITAL_COMMUNITY)
Admission: RE | Admit: 2022-01-29 | Discharge: 2022-01-29 | Disposition: A | Discharge status: Home / Self Care | Payer: 59 | Source: Ambulatory Visit | Attending: Internal Medicine | Admitting: Internal Medicine

## 2022-01-29 ENCOUNTER — Encounter: Payer: Self-pay | Admitting: Internal Medicine

## 2022-01-29 VITALS — BP 112/82 | HR 78 | Temp 98.9°F | Ht 62.0 in | Wt 203.8 lb

## 2022-01-29 DIAGNOSIS — Z0000 Encounter for general adult medical examination without abnormal findings: Secondary | ICD-10-CM

## 2022-01-29 DIAGNOSIS — D473 Essential (hemorrhagic) thrombocythemia: Secondary | ICD-10-CM | POA: Diagnosis not present

## 2022-01-29 DIAGNOSIS — M069 Rheumatoid arthritis, unspecified: Secondary | ICD-10-CM

## 2022-01-29 DIAGNOSIS — F439 Reaction to severe stress, unspecified: Secondary | ICD-10-CM

## 2022-01-29 DIAGNOSIS — D649 Anemia, unspecified: Secondary | ICD-10-CM | POA: Diagnosis not present

## 2022-01-29 DIAGNOSIS — R000 Tachycardia, unspecified: Secondary | ICD-10-CM

## 2022-01-29 DIAGNOSIS — E7800 Pure hypercholesterolemia, unspecified: Secondary | ICD-10-CM | POA: Diagnosis not present

## 2022-01-29 NOTE — Assessment & Plan Note (Addendum)
Physical today 01/29/22.  PAP 01/29/22.  Mammogram 08/31/21 - Birads I. Colonoscopy 06/23/21 - recommended f/u colonoscopy in 3 years.  ?

## 2022-01-29 NOTE — Progress Notes (Signed)
[name], female   DOB: 07-Nov-1970, 51 y.o.   MRN: [id number]   Subjective:    [name], female    DOB: 11-24-1970, 51 y.o.   MRN: [id number]   Patient here for her physical exam.   Chief Complaint  Patient presents with   Annual Exam   .   HPI Followed by rheumatology and ortho for her knees.  Also planning to see ortho for her thumb/hand.  Sees Dr Posey Pronto for her RA.  On cimzia and plaquenil.  Previously had reported intermittent episodes of increased heart rate.  Has appt to see cardiology end of this month.  No increased heart rate or palpitations currently.  Breathing stable.  No acid reflux reported.  No abdominal pain.  Bowels moving.  LMP last week.  Still with increased stress.  Appears to be doing better.    Past Medical History:  Diagnosis Date   Allergy    Blood in stool    H/O   Complication of anesthesia    History of chicken pox    Hx: UTI (urinary tract infection)    Hyperlipidemia    PONV (postoperative nausea and vomiting)    Past Surgical History:  Procedure Laterality Date   AUGMENTATION MAMMAPLASTY  2001   CESAREAN SECTION  2007 and 2009   COLONOSCOPY WITH PROPOFOL N/A 06/19/2021   Procedure: COLONOSCOPY WITH PROPOFOL;  Surgeon: Virgel Manifold, MD;  Location: [facility] ENDOSCOPY;  Service: Endoscopy;  Laterality: N/A;   tummy tuck  2001   Family History  Problem Relation Age of Onset   Healthy Mother    Hypertension Mother    Cancer Father        non hogkins lymphoma   Atrial fibrillation Father    Heart disease Father    Hypertension Father    Arthritis Sister    Asthma Sister    Hypertension Sister    Healthy Daughter    Healthy Son    Alzheimer's disease Maternal Grandmother    Healthy Sister    Arthritis Maternal Grandfather    Colon cancer Paternal Uncle 43   Breast cancer Neg Hx    Social History   Socioeconomic History   Marital status: Married    Spouse name: Not on file   Number of children:  Not on file   Years of education: Not on file   Highest education level: Not on file  Occupational History   Not on file  Tobacco Use   Smoking status: Never   Smokeless tobacco: Never  Vaping Use   Vaping Use: Never used  Substance and Sexual Activity   Alcohol use: Yes    Alcohol/week: 0.0 standard drinks    Comment: occasional   Drug use: No   Sexual activity: Not on file  Other Topics Concern   Not on file  Social History Narrative   Not on file   Social Determinants of Health   Financial Resource Strain: Not on file  Food Insecurity: Not on file  Transportation Needs: Not on file  Physical Activity: Not on file  Stress: Not on file  Social Connections: Not on file     Review of Systems  Constitutional:  Negative for fatigue and unexpected weight change.  HENT:  Negative for congestion, sinus pressure and sore throat.   Eyes:  Negative for pain and visual disturbance.  Respiratory:  Negative for cough, chest tightness and shortness of breath.   Cardiovascular:  Negative for  chest pain and palpitations.  Gastrointestinal:  Negative for abdominal pain, constipation and diarrhea.  Genitourinary:  Negative for difficulty urinating and frequency.  Musculoskeletal:  Negative for back pain and joint swelling.  Skin:  Negative for color change and rash.  Neurological:  Negative for dizziness and headaches.  Hematological:  Negative for adenopathy. Does not bruise/bleed easily.  Psychiatric/Behavioral:  Negative for decreased concentration and dysphoric mood.       Objective:     BP 112/82 (BP Location: Left Arm, Patient Position: Sitting)   Pulse 78   Temp 98.9 F (37.2 C) (Oral)   Ht '5\' 2"'$  (1.575 m)   Wt 203 lb 12.8 oz (92.4 kg)   SpO2 97%   BMI 37.28 kg/m  Wt Readings from Last 3 Encounters:  01/29/22 203 lb 12.8 oz (92.4 kg)  01/06/22 206 lb 9.6 oz (93.7 kg)  12/31/21 208 lb 3.2 oz (94.4 kg)    Physical Exam Vitals reviewed.  Constitutional:       General: She is not in acute distress.    Appearance: Normal appearance. She is well-developed.  HENT:     Head: Normocephalic and atraumatic.     Right Ear: External ear normal.     Left Ear: External ear normal.  Eyes:     General: No scleral icterus.       Right eye: No discharge.        Left eye: No discharge.     Conjunctiva/sclera: Conjunctivae normal.  Neck:     Thyroid: No thyromegaly.  Cardiovascular:     Rate and Rhythm: Normal rate and regular rhythm.  Pulmonary:     Effort: No tachypnea, accessory muscle usage or respiratory distress.     Breath sounds: Normal breath sounds. No decreased breath sounds or wheezing.  Chest:  Breasts:    Right: No inverted nipple, mass, nipple discharge or tenderness (no axillary adenopathy).     Left: No inverted nipple, mass, nipple discharge or tenderness (no axilarry adenopathy).  Abdominal:     General: Bowel sounds are normal.     Palpations: Abdomen is soft.     Tenderness: There is no abdominal tenderness.  Musculoskeletal:        General: No swelling or tenderness.     Cervical back: Neck supple.  Lymphadenopathy:     Cervical: No cervical adenopathy.  Skin:    Findings: No erythema or rash.  Neurological:     Mental Status: She is alert and oriented to person, place, and time.  Psychiatric:        Mood and Affect: Mood normal.        Behavior: Behavior normal.     Outpatient Encounter Medications as of 01/29/2022  Medication Sig   acetaminophen (TYLENOL) 500 MG tablet Take 500 mg by mouth every 6 (six) hours as needed.   buPROPion (WELLBUTRIN SR) 150 MG 12 hr tablet Take 1 tablet (150 mg total) by mouth 2 (two) times daily.   Certolizumab Pegol 2 X 200 MG/ML PSKT Inject into the skin every 30 (thirty) days.   cetirizine (ZYRTEC) 10 MG tablet Take 10 mg by mouth daily.   COVID-19 mRNA bivalent vaccine, Pfizer, (PFIZER COVID-19 VAC BIVALENT) injection Inject into the muscle.   famotidine (PEPCID) 20 MG tablet Take 20 mg  by mouth 2 (two) times daily.   fluticasone (FLONASE) 50 MCG/ACT nasal spray USE 2 SPRAYS IN EACH NOSTRIL ONCE DAILY   gabapentin (NEURONTIN) 300 MG capsule TAKE 3 CAPSULES BY MOUTH  EVERY EVENING   hydroxychloroquine (PLAQUENIL) 200 MG tablet TAKE 1 TABLET BY MOUTH TWICE DAILY FOR INFLAMMATORY ARTHRITIS   Melatonin 10 MG TABS Take 10 mg by mouth at bedtime.   meloxicam (MOBIC) 7.5 MG tablet TAKE 1 TABLET BY MOUTH TWICE DAILY WITH MEALS   rosuvastatin (CRESTOR) 10 MG tablet TAKE 1 TABLET BY MOUTH DAILY   [DISCONTINUED] ALPRAZolam (XANAX) 0.25 MG tablet TAKE 1 TABLET BY MOUTH ONCE DAILY AS NEEDED FOR ANXIETY   No facility-administered encounter medications on file as of 01/29/2022.     Lab Results  Component Value Date   WBC 4.6 09/01/2021   HGB 13.3 09/01/2021   HCT 40.0 09/01/2021   PLT 260.0 09/01/2021   GLUCOSE 81 12/04/2021   CHOL 158 12/04/2021   TRIG 152.0 (H) 12/04/2021   HDL 55.80 12/04/2021   LDLDIRECT 87.0 04/03/2019   LDLCALC 72 12/04/2021   ALT 14 12/04/2021   AST 16 12/04/2021   NA 141 12/04/2021   K 3.5 12/04/2021   CL 106 12/04/2021   CREATININE 0.89 12/04/2021   BUN 14 12/04/2021   CO2 29 12/04/2021   TSH 1.78 09/01/2021    MM 3D SCREEN BREAST W/IMPLANT BILATERAL  Result Date: 08/31/2021 CLINICAL DATA:  Screening. EXAM: DIGITAL SCREENING BILATERAL MAMMOGRAM WITH IMPLANTS, CAD AND TOMOSYNTHESIS TECHNIQUE: Bilateral screening digital craniocaudal and mediolateral oblique mammograms were obtained. Bilateral screening digital breast tomosynthesis was performed. The images were evaluated with computer-aided detection. Standard and/or implant displaced views were performed. COMPARISON:  Previous exam(s). ACR Breast Density Category b: There are scattered areas of fibroglandular density. FINDINGS: The patient has retroglandular implants. There are no findings suspicious for malignancy. IMPRESSION: No mammographic evidence of malignancy. A result letter of this screening  mammogram will be mailed directly to the patient. RECOMMENDATION: Screening mammogram in one year. (Code:SM-B-01Y) BI-RADS CATEGORY  1:  Negative. Electronically Signed   By: Lillia Mountain M.D.   On: 08/31/2021 11:22      Assessment & Plan:   Problem List Items Addressed This Visit     Anemia    Follow cbc.        Essential (hemorrhagic) thrombocythemia ([place])    Follow cbc.        Hypercholesterolemia    Continue crestor.  Low cholesterol diet and exercise.  Follow lipid panel and liver function tests.         Relevant Orders   Hepatic function panel   Basic metabolic panel   Lipid panel   Rheumatoid arthritis ([place])    Seeing Dr Posey Pronto.   On cimzia and plaquenil.  Overall has done well on this medication.  Follow.        Routine physical examination    Physical today 01/29/22.  PAP 01/29/22.  Mammogram 08/31/21 - Birads I. Colonoscopy 06/23/21 - recommended f/u colonoscopy in 3 years.        Relevant Orders   Cytology - PAP( [place]) (Completed)   Stress    Continues on wellbutrin.  Increased stress as outlined.  She is seeing a therapist.  Does not feel needs any further intervention at this time.  Follow.        Tachycardia    See last note.  EKG as outlined.  Overall stable currently.  Has f/u planned with cardiology for further evaluation given persistent intermittent increased heart rate.         Other Visit Diagnoses     Routine general medical examination at a health care facility    -  Primary        Einar Pheasant, MD

## 2022-01-31 ENCOUNTER — Other Ambulatory Visit: Payer: Self-pay | Admitting: Family

## 2022-01-31 ENCOUNTER — Other Ambulatory Visit: Payer: Self-pay

## 2022-01-31 MED FILL — Gabapentin Cap 300 MG: ORAL | 30 days supply | Qty: 90 | Fill #1 | Status: AC

## 2022-02-01 ENCOUNTER — Other Ambulatory Visit: Payer: Self-pay

## 2022-02-01 ENCOUNTER — Other Ambulatory Visit: Payer: Self-pay | Admitting: Family

## 2022-02-02 ENCOUNTER — Encounter: Payer: Self-pay | Admitting: Internal Medicine

## 2022-02-02 ENCOUNTER — Other Ambulatory Visit: Payer: Self-pay

## 2022-02-02 DIAGNOSIS — M0609 Rheumatoid arthritis without rheumatoid factor, multiple sites: Secondary | ICD-10-CM | POA: Diagnosis not present

## 2022-02-02 DIAGNOSIS — F411 Generalized anxiety disorder: Secondary | ICD-10-CM | POA: Diagnosis not present

## 2022-02-02 DIAGNOSIS — M79644 Pain in right finger(s): Secondary | ICD-10-CM | POA: Diagnosis not present

## 2022-02-02 DIAGNOSIS — M1811 Unilateral primary osteoarthritis of first carpometacarpal joint, right hand: Secondary | ICD-10-CM | POA: Diagnosis not present

## 2022-02-02 DIAGNOSIS — M1380 Other specified arthritis, unspecified site: Secondary | ICD-10-CM | POA: Diagnosis not present

## 2022-02-03 ENCOUNTER — Other Ambulatory Visit: Payer: Self-pay

## 2022-02-03 LAB — CYTOLOGY - PAP
Comment: NEGATIVE
Diagnosis: NEGATIVE
Diagnosis: REACTIVE
High risk HPV: NEGATIVE

## 2022-02-03 MED ORDER — ALPRAZOLAM 0.25 MG PO TABS
ORAL_TABLET | ORAL | 0 refills | Status: DC
  Filled 2022-02-03: qty 30, 30d supply, fill #0

## 2022-02-07 ENCOUNTER — Encounter: Payer: Self-pay | Admitting: Internal Medicine

## 2022-02-07 NOTE — Assessment & Plan Note (Signed)
Seeing Dr Patel.   On cimzia and plaquenil.  Overall has done well on this medication.  Follow.  

## 2022-02-07 NOTE — Assessment & Plan Note (Signed)
Continue crestor.  Low cholesterol diet and exercise. Follow lipid panel and liver function tests.   

## 2022-02-07 NOTE — Assessment & Plan Note (Signed)
Follow cbc.  

## 2022-02-07 NOTE — Assessment & Plan Note (Signed)
See last note.  EKG as outlined.  Overall stable currently.  Has f/u planned with cardiology for further evaluation given persistent intermittent increased heart rate.

## 2022-02-07 NOTE — Assessment & Plan Note (Signed)
Continues on wellbutrin.  Increased stress as outlined.  She is seeing a therapist.  Does not feel needs any further intervention at this time.  Follow.

## 2022-02-16 ENCOUNTER — Ambulatory Visit (INDEPENDENT_AMBULATORY_CARE_PROVIDER_SITE_OTHER): Payer: 59

## 2022-02-16 ENCOUNTER — Ambulatory Visit (INDEPENDENT_AMBULATORY_CARE_PROVIDER_SITE_OTHER): Payer: 59 | Admitting: Cardiology

## 2022-02-16 ENCOUNTER — Encounter: Payer: Self-pay | Admitting: Cardiology

## 2022-02-16 ENCOUNTER — Other Ambulatory Visit: Payer: Self-pay

## 2022-02-16 VITALS — BP 118/75 | HR 77 | Ht 63.0 in | Wt 201.2 lb

## 2022-02-16 DIAGNOSIS — F411 Generalized anxiety disorder: Secondary | ICD-10-CM | POA: Diagnosis not present

## 2022-02-16 DIAGNOSIS — R002 Palpitations: Secondary | ICD-10-CM | POA: Diagnosis not present

## 2022-02-16 DIAGNOSIS — E782 Mixed hyperlipidemia: Secondary | ICD-10-CM

## 2022-02-16 NOTE — Progress Notes (Signed)
Cardiology Office Note:    Date:  02/16/2022   ID:  Tamara Shah, DOB 1971/03/11, MRN [id number]  PCP:  Einar Pheasant, MD   [facility] Providers Cardiologist:  None     Referring MD: Einar Pheasant, MD   Chief Complaint  Patient presents with   New Patient (Initial Visit)    Consult for tachycardia / palpitations    Tamara Shah is a 51 y.o. female who is being seen today for the evaluation of palpitations at the request of Einar Pheasant, MD.   History of Present Illness:    Tamara Shah is a 51 y.o. female with a hx of hyperlipidemia, rheumatoid arthritis, who presents due to palpitations.  Patient states having on and off palpitations over the past 6 months.  Symptoms usually last couple of minutes.  Denies dizziness or syncope.  Saw her primary care provider for scheduled visit last month where palpitation was noted lasting couple of minutes.  Her pulse was about 140 bpm, EKG obtained showed sinus rhythm.  Symptoms usually occur about twice a week.   States having a lot of stress in her life and also feels anxious sometimes.  Denies smoking, family history of A-fib in her father.  No history of heart attacks.  Past Medical History:  Diagnosis Date   Allergy    Blood in stool    H/O   Complication of anesthesia    History of chicken pox    Hx: UTI (urinary tract infection)    Hyperlipidemia    PONV (postoperative nausea and vomiting)     Past Surgical History:  Procedure Laterality Date   AUGMENTATION MAMMAPLASTY  2001   CESAREAN SECTION  2007 and 2009   COLONOSCOPY WITH PROPOFOL N/A 06/19/2021   Procedure: COLONOSCOPY WITH PROPOFOL;  Surgeon: Virgel Manifold, MD;  Location: [facility] ENDOSCOPY;  Service: Endoscopy;  Laterality: N/A;   tummy tuck  2001    Current Medications: Current Meds  Medication Sig   acetaminophen (TYLENOL) 500 MG tablet Take 500 mg by mouth every 6 (six) hours as needed.   ALPRAZolam (XANAX) 0.25 MG tablet TAKE 1 TABLET  BY MOUTH ONCE DAILY AS NEEDED FOR ANXIETY   buPROPion (WELLBUTRIN SR) 150 MG 12 hr tablet Take 1 tablet (150 mg total) by mouth 2 (two) times daily.   Certolizumab Pegol 2 X 200 MG/ML PSKT Inject into the skin every 30 (thirty) days.   cetirizine (ZYRTEC) 10 MG tablet Take 10 mg by mouth daily.   COVID-19 mRNA bivalent vaccine, Pfizer, (PFIZER COVID-19 VAC BIVALENT) injection Inject into the muscle.   Cyanocobalamin (VITAMIN B 12 PO) Inject 1,000 mcg into the skin every 30 (thirty) days.   famotidine (PEPCID) 20 MG tablet Take 20 mg by mouth 2 (two) times daily.   ferrous sulfate 325 (65 FE) MG tablet Take 325 mg by mouth daily with breakfast.   fluticasone (FLONASE) 50 MCG/ACT nasal spray USE 2 SPRAYS IN EACH NOSTRIL ONCE DAILY   gabapentin (NEURONTIN) 300 MG capsule TAKE 3 CAPSULES BY MOUTH EVERY EVENING   hydroxychloroquine (PLAQUENIL) 200 MG tablet TAKE 1 TABLET BY MOUTH TWICE DAILY FOR INFLAMMATORY ARTHRITIS   Melatonin 10 MG TABS Take 10 mg by mouth at bedtime.   meloxicam (MOBIC) 7.5 MG tablet TAKE 1 TABLET BY MOUTH TWICE DAILY WITH MEALS   rosuvastatin (CRESTOR) 10 MG tablet TAKE 1 TABLET BY MOUTH DAILY     Allergies:   Patient has no known allergies.   Social History  Socioeconomic History   Marital status: Married    Spouse name: Not on file   Number of children: Not on file   Years of education: Not on file   Highest education level: Not on file  Occupational History   Not on file  Tobacco Use   Smoking status: Never   Smokeless tobacco: Never  Vaping Use   Vaping Use: Never used  Substance and Sexual Activity   Alcohol use: Yes    Alcohol/week: 0.0 standard drinks    Comment: occasional   Drug use: No   Sexual activity: Not on file  Other Topics Concern   Not on file  Social History Narrative   Not on file   Social Determinants of Health   Financial Resource Strain: Not on file  Food Insecurity: Not on file  Transportation Needs: Not on file  Physical  Activity: Not on file  Stress: Not on file  Social Connections: Not on file     Family History: The patient's family history includes Alzheimer's disease in her maternal grandmother; Arthritis in her maternal grandfather and sister; Asthma in her sister; Atrial fibrillation in her father; Cancer in her father; Colon cancer (age of onset: 74) in her paternal uncle; Healthy in her daughter, mother, sister, and son; Heart disease in her father; Hypertension in her father, mother, and sister. There is no history of Breast cancer.  ROS:   Please see the history of present illness.     All other systems reviewed and are negative.  EKGs/Labs/Other Studies Reviewed:    The following studies were reviewed today:   EKG:  EKG is  ordered today.  The ekg ordered today demonstrates normal sinus rhythm,  Recent Labs: 09/01/2021: Hemoglobin 13.3; Platelets 260.0; TSH 1.78 12/04/2021: ALT 14; BUN 14; Creatinine, Ser 0.89; Potassium 3.5; Sodium 141  Recent Lipid Panel    Component Value Date/Time   CHOL 158 12/04/2021 0904   TRIG 152.0 (H) 12/04/2021 0904   HDL 55.80 12/04/2021 0904   CHOLHDL 3 12/04/2021 0904   VLDL 30.4 12/04/2021 0904   LDLCALC 72 12/04/2021 0904   LDLDIRECT 87.0 04/03/2019 1051     Risk Assessment/Calculations:         Physical Exam:    VS:  BP 118/75 (BP Location: Left Arm, Patient Position: Sitting, Cuff Size: Normal)   Pulse 77   Ht '5\' 3"'$  (1.6 m)   Wt 201 lb 3.2 oz (91.3 kg)   SpO2 98%   BMI 35.64 kg/m     Wt Readings from Last 3 Encounters:  02/16/22 201 lb 3.2 oz (91.3 kg)  01/29/22 203 lb 12.8 oz (92.4 kg)  01/06/22 206 lb 9.6 oz (93.7 kg)     GEN:  Well nourished, well developed in no acute distress HEENT: Normal CARDIAC: RRR, no murmurs, rubs, gallops RESPIRATORY:  Clear to auscultation without rales, wheezing or rhonchi  ABDOMEN: Soft, non-tender, non-distended MUSCULOSKELETAL:  No edema; No deformity  SKIN: Warm and dry NEUROLOGIC:  Alert and  oriented x 3 PSYCHIATRIC:  Normal affect   ASSESSMENT:    1. Palpitations   2. Mixed hyperlipidemia    PLAN:    In order of problems listed above:  Palpitations, placed 2-week cardiac monitor to evaluate any significant arrhythmias.  EKG today showed sinus rhythm, heart rate 77. Hyperlipidemia, continue Crestor.  Follow-up after cardiac monitor.      Medication Adjustments/Labs and Tests Ordered: Current medicines are reviewed at length with the patient today.  Concerns regarding  medicines are outlined above.  Orders Placed This Encounter  Procedures   LONG TERM MONITOR (3-14 DAYS)   EKG 12-Lead   No orders of the defined types were placed in this encounter.   Patient Instructions  Medication Instructions:   Your physician recommends that you continue on your current medications as directed. Please refer to the Current Medication list given to you today.  *If you need a refill on your cardiac medications before your next appointment, please call your pharmacy*   Lab Work:  None ordered   Testing/Procedures:   Your physician has recommended that you wear a Zio XT monitor for 2 weeks. This will be mailed to your home address in 4-5 business days.   This monitor is a medical device that records the heart's electrical activity. Doctors most often use these monitors to diagnose arrhythmias. Arrhythmias are problems with the speed or rhythm of the heartbeat. The monitor is a small device applied to your chest. You can wear one while you do your normal daily activities. While wearing this monitor if you have any symptoms to push the button and record what you felt. Once you have worn this monitor for the period of time provider prescribed (Usually 14 days), you will return the monitor device in the postage paid box. Once it is returned they will download the data collected and provide [place] with a report which the provider will then review and we will call you with those results.  Important tips:  Avoid showering during the first 24 hours of wearing the monitor. Avoid excessive sweating to help maximize wear time. Do not submerge the device, no hot tubs, and no swimming pools. Keep any lotions or oils away from the patch. After 24 hours you may shower with the patch on. Take brief showers with your back facing the shower head.  Do not remove patch once it has been placed because that will interrupt data and decrease adhesive wear time. Push the button when you have any symptoms and write down what you were feeling. Once you have completed wearing your monitor, remove and place into box which has postage paid and place in your outgoing mailbox.  If for some reason you have misplaced your box then call our office and we can provide another box and/or mail it off for you.    Follow-Up: At [facility], you and your health needs are our priority.  As part of our continuing mission to provide you with exceptional heart care, we have created designated Provider Care Teams.  These Care Teams include your primary Cardiologist (physician) and Advanced Practice Providers (APPs -  Physician Assistants and Nurse Practitioners) who all work together to provide you with the care you need, when you need it.  We recommend signing up for the patient portal called "MyChart".  Sign up information is provided on this After Visit Summary.  MyChart is used to connect with patients for Virtual Visits (Telemedicine).  Patients are able to view lab/test results, encounter notes, upcoming appointments, etc.  Non-urgent messages can be sent to your provider as well.   To learn more about what you can do with MyChart, go to [email or web address].    Your next appointment:   6 week(s)  The format for your next appointment:   In Person  Provider:   You may see Kate Sable, MD or one of the following Advanced Practice Providers on your designated Care Team:   Murray Hodgkins,  NP Christell Faith, PA-C [place]  Kathlen Mody, PA-C    Other Instructions   Important Information About Sugar         Signed, Kate Sable, MD  02/16/2022 9:31 AM    [place]

## 2022-02-16 NOTE — Patient Instructions (Signed)
Medication Instructions:   Your physician recommends that you continue on your current medications as directed. Please refer to the Current Medication list given to you today.  *If you need a refill on your cardiac medications before your next appointment, please call your pharmacy*   Lab Work:  None ordered   Testing/Procedures:   Your physician has recommended that you wear a Zio XT monitor for 2 weeks. This will be mailed to your home address in 4-5 business days.   This monitor is a medical device that records the heart's electrical activity. Doctors most often use these monitors to diagnose arrhythmias. Arrhythmias are problems with the speed or rhythm of the heartbeat. The monitor is a small device applied to your chest. You can wear one while you do your normal daily activities. While wearing this monitor if you have any symptoms to push the button and record what you felt. Once you have worn this monitor for the period of time provider prescribed (Usually 14 days), you will return the monitor device in the postage paid box. Once it is returned they will download the data collected and provide [place] with a report which the provider will then review and we will call you with those results. Important tips:  Avoid showering during the first 24 hours of wearing the monitor. Avoid excessive sweating to help maximize wear time. Do not submerge the device, no hot tubs, and no swimming pools. Keep any lotions or oils away from the patch. After 24 hours you may shower with the patch on. Take brief showers with your back facing the shower head.  Do not remove patch once it has been placed because that will interrupt data and decrease adhesive wear time. Push the button when you have any symptoms and write down what you were feeling. Once you have completed wearing your monitor, remove and place into box which has postage paid and place in your outgoing mailbox.  If for some reason you have  misplaced your box then call our office and we can provide another box and/or mail it off for you.    Follow-Up: At [facility], you and your health needs are our priority.  As part of our continuing mission to provide you with exceptional heart care, we have created designated Provider Care Teams.  These Care Teams include your primary Cardiologist (physician) and Advanced Practice Providers (APPs -  Physician Assistants and Nurse Practitioners) who all work together to provide you with the care you need, when you need it.  We recommend signing up for the patient portal called "MyChart".  Sign up information is provided on this After Visit Summary.  MyChart is used to connect with patients for Virtual Visits (Telemedicine).  Patients are able to view lab/test results, encounter notes, upcoming appointments, etc.  Non-urgent messages can be sent to your provider as well.   To learn more about what you can do with MyChart, go to [email or web address].    Your next appointment:   6 week(s)  The format for your next appointment:   In Person  Provider:   You may see Kate Sable, MD or one of the following Advanced Practice Providers on your designated Care Team:   Murray Hodgkins, NP Christell Faith, PA-C [place] Kathlen Mody, [place]    Other Instructions   Important Information About Sugar

## 2022-02-17 ENCOUNTER — Other Ambulatory Visit: Payer: Self-pay | Admitting: Internal Medicine

## 2022-02-17 ENCOUNTER — Other Ambulatory Visit: Payer: Self-pay

## 2022-02-17 MED ORDER — FLUTICASONE PROPIONATE 50 MCG/ACT NA SUSP
2.0000 | Freq: Every day | NASAL | 2 refills | Status: DC
  Filled 2022-02-17: qty 16, 30d supply, fill #0
  Filled 2022-03-16: qty 16, 30d supply, fill #1
  Filled 2022-05-02: qty 16, 30d supply, fill #2

## 2022-02-22 DIAGNOSIS — R002 Palpitations: Secondary | ICD-10-CM | POA: Diagnosis not present

## 2022-02-23 ENCOUNTER — Ambulatory Visit (INDEPENDENT_AMBULATORY_CARE_PROVIDER_SITE_OTHER): Payer: 59 | Admitting: *Deleted

## 2022-02-23 ENCOUNTER — Telehealth: Payer: Self-pay | Admitting: *Deleted

## 2022-02-23 DIAGNOSIS — F411 Generalized anxiety disorder: Secondary | ICD-10-CM | POA: Diagnosis not present

## 2022-02-23 DIAGNOSIS — E538 Deficiency of other specified B group vitamins: Secondary | ICD-10-CM

## 2022-02-23 DIAGNOSIS — M0609 Rheumatoid arthritis without rheumatoid factor, multiple sites: Secondary | ICD-10-CM | POA: Diagnosis not present

## 2022-02-23 MED ORDER — CYANOCOBALAMIN 1000 MCG/ML IJ SOLN
1000.0000 ug | Freq: Once | INTRAMUSCULAR | Status: AC
  Administered 2022-02-23: 1000 ug via INTRAMUSCULAR

## 2022-02-23 NOTE — Telephone Encounter (Signed)
Patient in for Nurse visit ask BP to be checked attained BP in left arm 138/88 pulse 73 patient stated BP earlier at Rheumatology was noted at 140/80, patient had no symptoms that she was just monitoring and that PCP was aware she was seeing cardiology has 2 week monitor on attached well.

## 2022-02-23 NOTE — Telephone Encounter (Signed)
Noted.  If persistent elevation in blood pressure, can schedule appt to discuss medication adjustment.

## 2022-02-23 NOTE — Progress Notes (Signed)
Patient presented for B 12 injection to left deltoid, patient voiced no concerns nor showed any signs of distress during injection. 

## 2022-03-02 DIAGNOSIS — F411 Generalized anxiety disorder: Secondary | ICD-10-CM | POA: Diagnosis not present

## 2022-03-08 ENCOUNTER — Other Ambulatory Visit: Payer: Self-pay

## 2022-03-08 ENCOUNTER — Other Ambulatory Visit: Payer: Self-pay | Admitting: Internal Medicine

## 2022-03-08 MED ORDER — GABAPENTIN 300 MG PO CAPS
ORAL_CAPSULE | ORAL | 1 refills | Status: DC
  Filled 2022-03-08: qty 90, 30d supply, fill #0
  Filled 2022-04-04: qty 90, 30d supply, fill #1

## 2022-03-08 MED ORDER — ROSUVASTATIN CALCIUM 10 MG PO TABS
ORAL_TABLET | Freq: Every day | ORAL | 2 refills | Status: DC
Start: 2022-03-08 — End: 2022-11-04
  Filled 2022-03-08: qty 90, 90d supply, fill #0
  Filled 2022-06-06: qty 90, 90d supply, fill #1
  Filled 2022-09-06: qty 90, 90d supply, fill #2

## 2022-03-09 DIAGNOSIS — F411 Generalized anxiety disorder: Secondary | ICD-10-CM | POA: Diagnosis not present

## 2022-03-12 DIAGNOSIS — R002 Palpitations: Secondary | ICD-10-CM | POA: Diagnosis not present

## 2022-03-16 ENCOUNTER — Other Ambulatory Visit: Payer: Self-pay

## 2022-03-16 ENCOUNTER — Telehealth: Payer: Self-pay

## 2022-03-16 DIAGNOSIS — F411 Generalized anxiety disorder: Secondary | ICD-10-CM | POA: Diagnosis not present

## 2022-03-16 MED ORDER — METOPROLOL SUCCINATE ER 25 MG PO TB24
25.0000 mg | ORAL_TABLET | Freq: Every day | ORAL | 3 refills | Status: DC
Start: 2022-03-16 — End: 2022-04-08
  Filled 2022-03-16: qty 30, 30d supply, fill #0
  Filled 2022-04-04 – 2022-04-05 (×2): qty 30, 30d supply, fill #1

## 2022-03-25 ENCOUNTER — Ambulatory Visit (INDEPENDENT_AMBULATORY_CARE_PROVIDER_SITE_OTHER): Payer: 59

## 2022-03-25 DIAGNOSIS — E538 Deficiency of other specified B group vitamins: Secondary | ICD-10-CM

## 2022-03-25 DIAGNOSIS — M0609 Rheumatoid arthritis without rheumatoid factor, multiple sites: Secondary | ICD-10-CM | POA: Diagnosis not present

## 2022-03-25 MED ORDER — CYANOCOBALAMIN 1000 MCG/ML IJ SOLN
1000.0000 ug | Freq: Once | INTRAMUSCULAR | Status: AC
  Administered 2022-03-25: 1000 ug via INTRAMUSCULAR

## 2022-03-25 NOTE — Progress Notes (Addendum)
[name] presents today for injection per MD orders. B12 injection administered IM in left Upper Arm. Administration without incident. Patient tolerated well.   Gae Bon, cma

## 2022-03-30 DIAGNOSIS — F411 Generalized anxiety disorder: Secondary | ICD-10-CM | POA: Diagnosis not present

## 2022-04-04 ENCOUNTER — Other Ambulatory Visit: Payer: Self-pay

## 2022-04-04 ENCOUNTER — Other Ambulatory Visit: Payer: Self-pay | Admitting: Internal Medicine

## 2022-04-05 ENCOUNTER — Other Ambulatory Visit: Payer: Self-pay

## 2022-04-05 MED ORDER — ALPRAZOLAM 0.25 MG PO TABS
ORAL_TABLET | ORAL | 0 refills | Status: DC
  Filled 2022-04-05: qty 30, 30d supply, fill #0

## 2022-04-06 DIAGNOSIS — F411 Generalized anxiety disorder: Secondary | ICD-10-CM | POA: Diagnosis not present

## 2022-04-08 ENCOUNTER — Other Ambulatory Visit: Payer: Self-pay

## 2022-04-08 ENCOUNTER — Encounter: Payer: Self-pay | Admitting: Internal Medicine

## 2022-04-08 ENCOUNTER — Encounter: Payer: Self-pay | Admitting: Cardiology

## 2022-04-08 ENCOUNTER — Ambulatory Visit (INDEPENDENT_AMBULATORY_CARE_PROVIDER_SITE_OTHER): Payer: 59 | Admitting: Cardiology

## 2022-04-08 VITALS — BP 120/62 | HR 60 | Ht 63.0 in | Wt 200.2 lb

## 2022-04-08 DIAGNOSIS — I471 Supraventricular tachycardia: Secondary | ICD-10-CM

## 2022-04-08 DIAGNOSIS — E782 Mixed hyperlipidemia: Secondary | ICD-10-CM | POA: Diagnosis not present

## 2022-04-08 MED ORDER — METOPROLOL SUCCINATE ER 25 MG PO TB24
25.0000 mg | ORAL_TABLET | Freq: Every day | ORAL | 3 refills | Status: DC
  Filled 2022-04-08: qty 90, 90d supply, fill #0
  Filled 2022-07-04: qty 90, 90d supply, fill #1
  Filled 2022-10-11: qty 90, 90d supply, fill #2
  Filled 2023-01-19: qty 90, 90d supply, fill #3

## 2022-04-08 NOTE — Patient Instructions (Signed)
Medication Instructions:   Your physician recommends that you continue on your current medications as directed. Please refer to the Current Medication list given to you today.   *If you need a refill on your cardiac medications before your next appointment, please call your pharmacy*  Follow-Up: At [facility], you and your health needs are our priority.  As part of our continuing mission to provide you with exceptional heart care, we have created designated Provider Care Teams.  These Care Teams include your primary Cardiologist (physician) and Advanced Practice Providers (APPs -  Physician Assistants and Nurse Practitioners) who all work together to provide you with the care you need, when you need it.  We recommend signing up for the patient portal called "MyChart".  Sign up information is provided on this After Visit Summary.  MyChart is used to connect with patients for Virtual Visits (Telemedicine).  Patients are able to view lab/test results, encounter notes, upcoming appointments, etc.  Non-urgent messages can be sent to your provider as well.   To learn more about what you can do with MyChart, go to [email or web address].    Your next appointment:   1 year(s)  The format for your next appointment:   In Person  Provider:   Brian Agbor-Etang, MD    Other Instructions   Important Information About Sugar       

## 2022-04-08 NOTE — Progress Notes (Signed)
Cardiology Office Note:    Date:  04/08/2022   ID:  Tamara Shah, DOB [birth year], MRN [id number]  PCP:  Einar Pheasant, MD   [facility] Providers Cardiologist:  None     Referring MD: Einar Pheasant, MD   Chief Complaint  Patient presents with   Other    6 wk f/u zio. Meds reviewed verbally with pt.    History of Present Illness:    Tamara Shah is a 51 y.o. female with a hx of hyperlipidemia, rheumatoid arthritis, who presents for follow-up.  Previously seen due to palpitations.  Cardiac monitor was placed to evaluate any significant arrhythmias.  Monitor did show occasional paroxysmal SVTs, Toprol-XL 25 mg daily was recommended.  She states feeling much better since starting Toprol-XL, has heart about 1 episodes since starting.  Overall feeling much better.  Denies any new concerns at this time.   Past Medical History:  Diagnosis Date   Allergy    Blood in stool    H/O   Complication of anesthesia    History of chicken pox    Hx: UTI (urinary tract infection)    Hyperlipidemia    PONV (postoperative nausea and vomiting)     Past Surgical History:  Procedure Laterality Date   AUGMENTATION MAMMAPLASTY  2001   CESAREAN SECTION  2007 and 2009   COLONOSCOPY WITH PROPOFOL N/A 06/19/2021   Procedure: COLONOSCOPY WITH PROPOFOL;  Surgeon: Virgel Manifold, MD;  Location: [facility] ENDOSCOPY;  Service: Endoscopy;  Laterality: N/A;   tummy tuck  2001    Current Medications: Current Meds  Medication Sig   acetaminophen (TYLENOL) 500 MG tablet Take 500 mg by mouth every 6 (six) hours as needed.   ALPRAZolam (XANAX) 0.25 MG tablet TAKE 1 TABLET BY MOUTH ONCE DAILY AS NEEDED FOR ANXIETY   buPROPion (WELLBUTRIN SR) 150 MG 12 hr tablet Take 1 tablet (150 mg total) by mouth 2 (two) times daily.   Certolizumab Pegol 2 X 200 MG/ML PSKT Inject into the skin every 30 (thirty) days.   cetirizine (ZYRTEC) 10 MG tablet Take 10 mg by mouth daily.   COVID-19 mRNA bivalent  vaccine, Pfizer, (PFIZER COVID-19 VAC BIVALENT) injection Inject into the muscle.   Cyanocobalamin (B-12 IJ) Inject as directed every 30 (thirty) days.   famotidine (PEPCID) 20 MG tablet Take 20 mg by mouth 2 (two) times daily.   ferrous sulfate 325 (65 FE) MG tablet Take 325 mg by mouth daily with breakfast.   fluticasone (FLONASE) 50 MCG/ACT nasal spray USE 2 SPRAYS IN EACH NOSTRIL ONCE DAILY   gabapentin (NEURONTIN) 300 MG capsule TAKE 3 CAPSULES BY MOUTH EVERY EVENING   hydroxychloroquine (PLAQUENIL) 200 MG tablet TAKE 1 TABLET BY MOUTH TWICE DAILY FOR INFLAMMATORY ARTHRITIS   Melatonin 10 MG TABS Take 10 mg by mouth at bedtime.   meloxicam (MOBIC) 7.5 MG tablet TAKE 1 TABLET BY MOUTH TWICE DAILY WITH MEALS   rosuvastatin (CRESTOR) 10 MG tablet TAKE 1 TABLET BY MOUTH DAILY   [DISCONTINUED] metoprolol succinate (TOPROL XL) 25 MG 24 hr tablet Take 1 tablet (25 mg total) by mouth daily.     Allergies:   Patient has no known allergies.   Social History   Socioeconomic History   Marital status: Married    Spouse name: Not on file   Number of children: Not on file   Years of education: Not on file   Highest education level: Not on file  Occupational History   Not on  file  Tobacco Use   Smoking status: Never   Smokeless tobacco: Never  Vaping Use   Vaping Use: Never used  Substance and Sexual Activity   Alcohol use: Yes    Alcohol/week: 0.0 standard drinks of alcohol    Comment: occasional   Drug use: No   Sexual activity: Not on file  Other Topics Concern   Not on file  Social History Narrative   Not on file   Social Determinants of Health   Financial Resource Strain: Not on file  Food Insecurity: Not on file  Transportation Needs: Not on file  Physical Activity: Not on file  Stress: Not on file  Social Connections: Not on file     Family History: The patient's family history includes Alzheimer's disease in her maternal grandmother; Arthritis in her maternal  grandfather and sister; Asthma in her sister; Atrial fibrillation in her father; Cancer in her father; Colon cancer (age of onset: 29) in her paternal uncle; Healthy in her daughter, mother, sister, and son; Heart disease in her father; Hypertension in her father, mother, and sister. There is no history of Breast cancer.  ROS:   Please see the history of present illness.     All other systems reviewed and are negative.  EKGs/Labs/Other Studies Reviewed:    The following studies were reviewed today:   EKG:  EKG is  ordered today.  The ekg ordered today demonstrates normal sinus rhythm,  Recent Labs: 09/01/2021: Hemoglobin 13.3; Platelets 260.0; TSH 1.78 12/04/2021: ALT 14; BUN 14; Creatinine, Ser 0.89; Potassium 3.5; Sodium 141  Recent Lipid Panel    Component Value Date/Time   CHOL 158 12/04/2021 0904   TRIG 152.0 (H) 12/04/2021 0904   HDL 55.80 12/04/2021 0904   CHOLHDL 3 12/04/2021 0904   VLDL 30.4 12/04/2021 0904   LDLCALC 72 12/04/2021 0904   LDLDIRECT 87.0 04/03/2019 1051     Risk Assessment/Calculations:         Physical Exam:    VS:  BP 120/62 (BP Location: Left Arm, Patient Position: Sitting, Cuff Size: Normal)   Pulse 60   Ht '5\' 3"'$  (1.6 m)   Wt 200 lb 4 oz (90.8 kg)   SpO2 98%   BMI 35.47 kg/m     Wt Readings from Last 3 Encounters:  04/08/22 200 lb 4 oz (90.8 kg)  02/16/22 201 lb 3.2 oz (91.3 kg)  01/29/22 203 lb 12.8 oz (92.4 kg)     GEN:  Well nourished, well developed in no acute distress HEENT: Normal CARDIAC: RRR, no murmurs, rubs, gallops RESPIRATORY:  Clear to auscultation without rales, wheezing or rhonchi  ABDOMEN: Soft, non-tender, non-distended MUSCULOSKELETAL:  No edema; No deformity  SKIN: Warm and dry NEUROLOGIC:  Alert and oriented x 3 PSYCHIATRIC:  Normal affect   ASSESSMENT:    1. Paroxysmal SVT (supraventricular tachycardia) ([place])   2. Mixed hyperlipidemia     PLAN:    In order of problems listed above:  Palpitations,  cardiac monitor showed occasional paroxysmal SVT, rare PACs.  Symptoms improved with starting Toprol-XL, continue Toprol-XL 25 mg daily. Hyperlipidemia, continue Crestor.  Follow-up yearly.      Medication Adjustments/Labs and Tests Ordered: Current medicines are reviewed at length with the patient today.  Concerns regarding medicines are outlined above.  Orders Placed This Encounter  Procedures   EKG 12-Lead   Meds ordered this encounter  Medications   metoprolol succinate (TOPROL XL) 25 MG 24 hr tablet    Sig: Take 1  tablet (25 mg total) by mouth daily.    Dispense:  90 tablet    Refill:  3    Does not need refill at this time. Next refill please fill for 90 days.    Patient Instructions  Medication Instructions:   Your physician recommends that you continue on your current medications as directed. Please refer to the Current Medication list given to you today.  *If you need a refill on your cardiac medications before your next appointment, please call your pharmacy*   Follow-Up: At [facility], you and your health needs are our priority.  As part of our continuing mission to provide you with exceptional heart care, we have created designated Provider Care Teams.  These Care Teams include your primary Cardiologist (physician) and Advanced Practice Providers (APPs -  Physician Assistants and Nurse Practitioners) who all work together to provide you with the care you need, when you need it.  We recommend signing up for the patient portal called "MyChart".  Sign up information is provided on this After Visit Summary.  MyChart is used to connect with patients for Virtual Visits (Telemedicine).  Patients are able to view lab/test results, encounter notes, upcoming appointments, etc.  Non-urgent messages can be sent to your provider as well.   To learn more about what you can do with MyChart, go to [email or web address].    Your next appointment:   1 year(s)  The format for  your next appointment:   In Person  Provider:   Kate Sable, MD    Other Instructions   Important Information About Sugar          Signed, Kate Sable, MD  04/08/2022 11:22 AM    [place]

## 2022-04-09 ENCOUNTER — Other Ambulatory Visit: Payer: Self-pay

## 2022-04-11 ENCOUNTER — Other Ambulatory Visit: Payer: Self-pay

## 2022-04-12 ENCOUNTER — Other Ambulatory Visit: Payer: Self-pay

## 2022-04-12 MED ORDER — MELOXICAM 7.5 MG PO TABS
7.5000 mg | ORAL_TABLET | Freq: Two times a day (BID) | ORAL | 3 refills | Status: DC
  Filled 2022-04-12: qty 60, 30d supply, fill #0
  Filled 2022-05-16: qty 60, 30d supply, fill #1
  Filled 2022-06-21: qty 60, 30d supply, fill #2
  Filled 2022-07-12: qty 60, 30d supply, fill #3

## 2022-04-13 DIAGNOSIS — F411 Generalized anxiety disorder: Secondary | ICD-10-CM | POA: Diagnosis not present

## 2022-04-16 ENCOUNTER — Telehealth: Payer: Self-pay

## 2022-04-16 NOTE — Telephone Encounter (Signed)
Patient states she would like to have her first shingles shot at her next visit with Dr. Einar Pheasant.

## 2022-04-16 NOTE — Telephone Encounter (Signed)
Pt advised better to go to pharmacy for shingles vax - covered by insurance, no admin fee. Pt agreeable to pharm injection, and thanked [place] for letting her know it would be better for her there.

## 2022-04-26 DIAGNOSIS — F411 Generalized anxiety disorder: Secondary | ICD-10-CM | POA: Diagnosis not present

## 2022-04-27 DIAGNOSIS — M0609 Rheumatoid arthritis without rheumatoid factor, multiple sites: Secondary | ICD-10-CM | POA: Diagnosis not present

## 2022-04-27 DIAGNOSIS — Z7960 Long term (current) use of unspecified immunomodulators and immunosuppressants: Secondary | ICD-10-CM | POA: Diagnosis not present

## 2022-04-27 DIAGNOSIS — M159 Polyosteoarthritis, unspecified: Secondary | ICD-10-CM | POA: Diagnosis not present

## 2022-04-29 ENCOUNTER — Other Ambulatory Visit: Payer: 59

## 2022-04-29 ENCOUNTER — Ambulatory Visit (INDEPENDENT_AMBULATORY_CARE_PROVIDER_SITE_OTHER): Payer: 59

## 2022-04-29 DIAGNOSIS — E538 Deficiency of other specified B group vitamins: Secondary | ICD-10-CM

## 2022-04-29 MED ORDER — CYANOCOBALAMIN 1000 MCG/ML IJ SOLN
1000.0000 ug | Freq: Once | INTRAMUSCULAR | Status: AC
  Administered 2022-04-29: 1000 ug via INTRAMUSCULAR

## 2022-04-29 NOTE — Progress Notes (Signed)
Patient presented for B 12 injection to left deltoid, patient voiced no concerns nor showed any signs of distress during injection. 

## 2022-05-02 ENCOUNTER — Other Ambulatory Visit: Payer: Self-pay | Admitting: Internal Medicine

## 2022-05-02 ENCOUNTER — Other Ambulatory Visit: Payer: Self-pay

## 2022-05-02 MED ORDER — GABAPENTIN 300 MG PO CAPS
ORAL_CAPSULE | ORAL | 1 refills | Status: DC
  Filled 2022-05-02: qty 90, 30d supply, fill #0
  Filled 2022-05-31 – 2022-06-06 (×2): qty 90, 30d supply, fill #1

## 2022-05-03 ENCOUNTER — Other Ambulatory Visit: Payer: Self-pay

## 2022-05-03 DIAGNOSIS — F411 Generalized anxiety disorder: Secondary | ICD-10-CM | POA: Diagnosis not present

## 2022-05-05 ENCOUNTER — Ambulatory Visit: Payer: 59 | Admitting: Internal Medicine

## 2022-05-10 DIAGNOSIS — F411 Generalized anxiety disorder: Secondary | ICD-10-CM | POA: Diagnosis not present

## 2022-05-16 ENCOUNTER — Other Ambulatory Visit: Payer: Self-pay

## 2022-05-16 ENCOUNTER — Other Ambulatory Visit: Payer: Self-pay | Admitting: Internal Medicine

## 2022-05-16 MED ORDER — BUPROPION HCL ER (SR) 150 MG PO TB12
150.0000 mg | ORAL_TABLET | Freq: Two times a day (BID) | ORAL | 1 refills | Status: DC
  Filled 2022-05-16: qty 180, 90d supply, fill #0
  Filled 2022-08-15: qty 180, 90d supply, fill #1

## 2022-05-17 ENCOUNTER — Other Ambulatory Visit: Payer: Self-pay

## 2022-05-17 DIAGNOSIS — F411 Generalized anxiety disorder: Secondary | ICD-10-CM | POA: Diagnosis not present

## 2022-05-23 ENCOUNTER — Other Ambulatory Visit: Payer: Self-pay | Admitting: Family

## 2022-05-25 ENCOUNTER — Other Ambulatory Visit: Payer: Self-pay

## 2022-05-25 ENCOUNTER — Other Ambulatory Visit: Payer: Self-pay | Admitting: Family

## 2022-05-25 DIAGNOSIS — F411 Generalized anxiety disorder: Secondary | ICD-10-CM | POA: Diagnosis not present

## 2022-05-25 DIAGNOSIS — M0609 Rheumatoid arthritis without rheumatoid factor, multiple sites: Secondary | ICD-10-CM | POA: Diagnosis not present

## 2022-05-27 ENCOUNTER — Encounter: Payer: Self-pay | Admitting: Internal Medicine

## 2022-05-27 ENCOUNTER — Other Ambulatory Visit: Payer: Self-pay

## 2022-05-27 MED FILL — Alprazolam Tab 0.25 MG: ORAL | 30 days supply | Qty: 30 | Fill #0 | Status: AC

## 2022-05-27 NOTE — Telephone Encounter (Signed)
Rx ok'd for alprazolam #30 with no refills.  

## 2022-05-31 ENCOUNTER — Ambulatory Visit (INDEPENDENT_AMBULATORY_CARE_PROVIDER_SITE_OTHER): Payer: 59

## 2022-05-31 ENCOUNTER — Other Ambulatory Visit: Payer: Self-pay

## 2022-05-31 DIAGNOSIS — F411 Generalized anxiety disorder: Secondary | ICD-10-CM | POA: Diagnosis not present

## 2022-05-31 DIAGNOSIS — E538 Deficiency of other specified B group vitamins: Secondary | ICD-10-CM | POA: Diagnosis not present

## 2022-05-31 MED ORDER — CYANOCOBALAMIN 1000 MCG/ML IJ SOLN
1000.0000 ug | Freq: Once | INTRAMUSCULAR | Status: AC
  Administered 2022-05-31: 1000 ug via INTRAMUSCULAR

## 2022-05-31 NOTE — Progress Notes (Addendum)
Patient presented for B 12 injection to left deltoid, patient voiced no concerns nor showed any signs of distress during injection. 

## 2022-06-06 ENCOUNTER — Other Ambulatory Visit: Payer: Self-pay | Admitting: Internal Medicine

## 2022-06-06 ENCOUNTER — Other Ambulatory Visit: Payer: Self-pay

## 2022-06-07 ENCOUNTER — Other Ambulatory Visit: Payer: Self-pay

## 2022-06-07 DIAGNOSIS — F411 Generalized anxiety disorder: Secondary | ICD-10-CM | POA: Diagnosis not present

## 2022-06-07 MED FILL — Fluticasone Propionate Nasal Susp 50 MCG/ACT: NASAL | 30 days supply | Qty: 16 | Fill #0 | Status: AC

## 2022-06-08 ENCOUNTER — Other Ambulatory Visit: Payer: Self-pay

## 2022-06-18 ENCOUNTER — Other Ambulatory Visit (INDEPENDENT_AMBULATORY_CARE_PROVIDER_SITE_OTHER): Payer: 59

## 2022-06-18 DIAGNOSIS — E7800 Pure hypercholesterolemia, unspecified: Secondary | ICD-10-CM

## 2022-06-18 LAB — BASIC METABOLIC PANEL
BUN: 13 mg/dL (ref 6–23)
CO2: 27 mEq/L (ref 19–32)
Calcium: 8.8 mg/dL (ref 8.4–10.5)
Chloride: 107 mEq/L (ref 96–112)
Creatinine, Ser: 0.84 mg/dL (ref 0.40–1.20)
GFR: 80.53 mL/min (ref >60.00)
Glucose, Bld: 82 mg/dL (ref 70–99)
Potassium: 4 mEq/L (ref 3.5–5.1)
Sodium: 142 mEq/L (ref 135–145)

## 2022-06-18 LAB — HEPATIC FUNCTION PANEL
ALT: 13 U/L (ref 0–35)
AST: 14 U/L (ref 0–37)
Albumin: 4.2 g/dL (ref 3.5–5.2)
Alkaline Phosphatase: 51 U/L (ref 39–117)
Bilirubin, Direct: 0.1 mg/dL (ref 0.0–0.3)
Total Bilirubin: 0.7 mg/dL (ref 0.2–1.2)
Total Protein: 6 g/dL (ref 6.0–8.3)

## 2022-06-18 LAB — LIPID PANEL
Cholesterol: 147 mg/dL (ref 0–200)
HDL: 56 mg/dL (ref >39.00)
LDL Cholesterol: 60 mg/dL (ref 0–99)
NonHDL: 90.67
Total CHOL/HDL Ratio: 3
Triglycerides: 153 mg/dL — ABNORMAL HIGH (ref 0.0–149.0)
VLDL: 30.6 mg/dL (ref 0.0–40.0)

## 2022-06-21 ENCOUNTER — Other Ambulatory Visit: Payer: Self-pay

## 2022-06-21 DIAGNOSIS — F411 Generalized anxiety disorder: Secondary | ICD-10-CM | POA: Diagnosis not present

## 2022-06-22 DIAGNOSIS — M0609 Rheumatoid arthritis without rheumatoid factor, multiple sites: Secondary | ICD-10-CM | POA: Diagnosis not present

## 2022-06-23 ENCOUNTER — Ambulatory Visit (INDEPENDENT_AMBULATORY_CARE_PROVIDER_SITE_OTHER): Payer: 59 | Admitting: Internal Medicine

## 2022-06-23 DIAGNOSIS — E7800 Pure hypercholesterolemia, unspecified: Secondary | ICD-10-CM

## 2022-06-24 ENCOUNTER — Encounter: Payer: Self-pay | Admitting: Internal Medicine

## 2022-06-24 NOTE — Progress Notes (Signed)
Appt was canceled (changed).  Pt was not seen.

## 2022-06-29 DIAGNOSIS — F411 Generalized anxiety disorder: Secondary | ICD-10-CM | POA: Diagnosis not present

## 2022-06-30 ENCOUNTER — Ambulatory Visit: Payer: 59

## 2022-07-02 ENCOUNTER — Encounter: Payer: Self-pay | Admitting: Internal Medicine

## 2022-07-02 ENCOUNTER — Ambulatory Visit: Payer: 59 | Admitting: Internal Medicine

## 2022-07-02 VITALS — BP 134/72 | HR 73 | Temp 99.3°F | Ht 63.0 in | Wt 202.6 lb

## 2022-07-02 DIAGNOSIS — Z23 Encounter for immunization: Secondary | ICD-10-CM | POA: Diagnosis not present

## 2022-07-02 DIAGNOSIS — E7800 Pure hypercholesterolemia, unspecified: Secondary | ICD-10-CM

## 2022-07-02 DIAGNOSIS — I471 Supraventricular tachycardia, unspecified: Secondary | ICD-10-CM | POA: Diagnosis not present

## 2022-07-02 DIAGNOSIS — M069 Rheumatoid arthritis, unspecified: Secondary | ICD-10-CM | POA: Diagnosis not present

## 2022-07-02 DIAGNOSIS — F439 Reaction to severe stress, unspecified: Secondary | ICD-10-CM

## 2022-07-02 DIAGNOSIS — E538 Deficiency of other specified B group vitamins: Secondary | ICD-10-CM

## 2022-07-02 DIAGNOSIS — Z1211 Encounter for screening for malignant neoplasm of colon: Secondary | ICD-10-CM

## 2022-07-02 MED ORDER — CYANOCOBALAMIN 1000 MCG/ML IJ SOLN
1000.0000 ug | Freq: Once | INTRAMUSCULAR | Status: AC
  Administered 2022-07-02: 1000 ug via INTRAMUSCULAR

## 2022-07-02 NOTE — Progress Notes (Unsigned)
[name], female   DOB: 1970/11/11, 51 y.o.   MRN: [id number]   Subjective:    [name], female    DOB: Oct 02, 1970, 51 y.o.   MRN: [id number]   Patient here for  Chief Complaint  Patient presents with   Follow-up    3 month follow up B12 and Flu shot   .   HPI Follow up regarding her blood pressure, cholesterol and incresed stress.  Sees rheumatology for RA. On plaquenil and cimzia. Still with increased stress. Discussed.  Family stress.  On wellbutrin.  Feels is helping, but feels may need something more.  Requiring 1/2 xanax daily now. Discussed psychiatry referral.  Metoprolol - has helped palpitations.  No chest pain or sob reported.  No increased cough or congestion.  No abdominal pain.  Bowels moving.    Past Medical History:  Diagnosis Date   Allergy    Blood in stool    H/O   Complication of anesthesia    History of chicken pox    Hx: UTI (urinary tract infection)    Hyperlipidemia    PONV (postoperative nausea and vomiting)    Past Surgical History:  Procedure Laterality Date   AUGMENTATION MAMMAPLASTY  2001   CESAREAN SECTION  2007 and 2009   COLONOSCOPY WITH PROPOFOL N/A 06/19/2021   Procedure: COLONOSCOPY WITH PROPOFOL;  Surgeon: Virgel Manifold, MD;  Location: [facility] ENDOSCOPY;  Service: Endoscopy;  Laterality: N/A;   tummy tuck  2001   Family History  Problem Relation Age of Onset   Healthy Mother    Hypertension Mother    Cancer Father        non hogkins lymphoma   Atrial fibrillation Father    Heart disease Father    Hypertension Father    Arthritis Sister    Asthma Sister    Hypertension Sister    Healthy Daughter    Healthy Son    Alzheimer's disease Maternal Grandmother    Healthy Sister    Arthritis Maternal Grandfather    Colon cancer Paternal Uncle 94   Breast cancer Neg Hx    Social History   Socioeconomic History   Marital status: Married    Spouse name: Not on file   Number of children: Not on  file   Years of education: Not on file   Highest education level: Not on file  Occupational History   Not on file  Tobacco Use   Smoking status: Never   Smokeless tobacco: Never  Vaping Use   Vaping Use: Never used  Substance and Sexual Activity   Alcohol use: Yes    Alcohol/week: 0.0 standard drinks of alcohol    Comment: occasional   Drug use: No   Sexual activity: Not on file  Other Topics Concern   Not on file  Social History Narrative   Not on file   Social Determinants of Health   Financial Resource Strain: Not on file  Food Insecurity: Not on file  Transportation Needs: Not on file  Physical Activity: Not on file  Stress: Not on file  Social Connections: Not on file     Review of Systems  Constitutional:  Negative for appetite change and unexpected weight change.  HENT:  Negative for congestion and sinus pressure.   Respiratory:  Negative for cough, chest tightness and shortness of breath.   Cardiovascular:  Negative for chest pain, palpitations and leg swelling.  Gastrointestinal:  Negative for abdominal pain, diarrhea,  nausea and vomiting.  Genitourinary:  Negative for difficulty urinating and dysuria.  Musculoskeletal:  Negative for myalgias.       Seeing rheumatology - joint pain.   Skin:  Negative for color change and rash.  Neurological:  Negative for dizziness and headaches.  Psychiatric/Behavioral:  Negative for agitation and dysphoric mood.        Increased stress as outlined.         Objective:     BP 134/72 (BP Location: Left Arm, Patient Position: Sitting, Cuff Size: Normal)   Pulse 73   Temp 99.3 F (37.4 C) (Oral)   Ht '5\' 3"'$  (1.6 m)   Wt 202 lb 9.6 oz (91.9 kg)   SpO2 95%   BMI 35.89 kg/m  Wt Readings from Last 3 Encounters:  07/02/22 202 lb 9.6 oz (91.9 kg)  04/08/22 200 lb 4 oz (90.8 kg)  02/16/22 201 lb 3.2 oz (91.3 kg)    Physical Exam Vitals reviewed.  Constitutional:      General: She is not in acute distress.     Appearance: Normal appearance.  HENT:     Head: Normocephalic and atraumatic.     Right Ear: External ear normal.     Left Ear: External ear normal.  Eyes:     General: No scleral icterus.       Right eye: No discharge.        Left eye: No discharge.     Conjunctiva/sclera: Conjunctivae normal.  Neck:     Thyroid: No thyromegaly.  Cardiovascular:     Rate and Rhythm: Normal rate and regular rhythm.  Pulmonary:     Effort: No respiratory distress.     Breath sounds: Normal breath sounds. No wheezing.  Abdominal:     General: Bowel sounds are normal.     Palpations: Abdomen is soft.     Tenderness: There is no abdominal tenderness.  Musculoskeletal:        General: No swelling or tenderness.     Cervical back: Neck supple. No tenderness.  Lymphadenopathy:     Cervical: No cervical adenopathy.  Skin:    Findings: No erythema or rash.  Neurological:     Mental Status: She is alert.  Psychiatric:        Mood and Affect: Mood normal.        Behavior: Behavior normal.      Outpatient Encounter Medications as of 07/02/2022  Medication Sig   acetaminophen (TYLENOL) 500 MG tablet Take 500 mg by mouth every 6 (six) hours as needed.   ALPRAZolam (XANAX) 0.25 MG tablet TAKE 1 TABLET BY MOUTH ONCE DAILY AS NEEDED FOR ANXIETY   buPROPion (WELLBUTRIN SR) 150 MG 12 hr tablet Take 1 tablet (150 mg total) by mouth 2 (two) times daily.   Certolizumab Pegol 2 X 200 MG/ML PSKT Inject into the skin every 30 (thirty) days.   cetirizine (ZYRTEC) 10 MG tablet Take 10 mg by mouth daily.   COVID-19 mRNA bivalent vaccine, Pfizer, (PFIZER COVID-19 VAC BIVALENT) injection Inject into the muscle.   Cyanocobalamin (B-12 IJ) Inject as directed every 30 (thirty) days.   famotidine (PEPCID) 20 MG tablet Take 20 mg by mouth 2 (two) times daily.   ferrous sulfate 325 (65 FE) MG tablet Take 325 mg by mouth daily with breakfast.   fluticasone (FLONASE) 50 MCG/ACT nasal spray USE 2 SPRAYS IN EACH NOSTRIL ONCE  DAILY   gabapentin (NEURONTIN) 300 MG capsule TAKE 3 CAPSULES BY MOUTH EVERY EVENING  hydroxychloroquine (PLAQUENIL) 200 MG tablet TAKE 1 TABLET BY MOUTH TWICE DAILY FOR INFLAMMATORY ARTHRITIS   Melatonin 10 MG TABS Take 10 mg by mouth at bedtime.   meloxicam (MOBIC) 7.5 MG tablet TAKE 1 TABLET BY MOUTH TWICE DAILY WITH MEALS   metoprolol succinate (TOPROL XL) 25 MG 24 hr tablet Take 1 tablet (25 mg total) by mouth daily.   rosuvastatin (CRESTOR) 10 MG tablet TAKE 1 TABLET BY MOUTH DAILY   [EXPIRED] cyanocobalamin (VITAMIN B12) injection 1,000 mcg    No facility-administered encounter medications on file as of 07/02/2022.     Lab Results  Component Value Date   WBC 4.6 09/01/2021   HGB 13.3 09/01/2021   HCT 40.0 09/01/2021   PLT 260.0 09/01/2021   GLUCOSE 82 06/18/2022   CHOL 147 06/18/2022   TRIG 153.0 (H) 06/18/2022   HDL 56.00 06/18/2022   LDLDIRECT 87.0 04/03/2019   LDLCALC 60 06/18/2022   ALT 13 06/18/2022   AST 14 06/18/2022   NA 142 06/18/2022   K 4.0 06/18/2022   CL 107 06/18/2022   CREATININE 0.84 06/18/2022   BUN 13 06/18/2022   CO2 27 06/18/2022   TSH 1.78 09/01/2021       Assessment & Plan:   Problem List Items Addressed This Visit     Hypercholesterolemia    Continue crestor.  Low cholesterol diet and exercise.  Follow lipid panel and liver function tests.        Relevant Orders   Lipid panel   Paroxysmal SVT (supraventricular tachycardia)    On metoprolol.  Helping.        Relevant Orders   TSH   Rheumatoid arthritis ([place])    Seeing Dr Posey Pronto.   On cimzia and plaquenil.  Overall has done well on this medication.  Follow.       Relevant Orders   CBC with Differential/Platelet   Hepatic function panel   Basic metabolic panel   Screening for colon cancer    Colonoscopy 06/2021 - recommended f/u in 3 years.       Stress - Primary    Increased stress.  On wellbutrin.  Requiring 1/2 xanax daily now.  Discussed.  Feels needs something more at  this time.  Discussed further treatment and evaluation.  Agreeable to referral to psychiatry.       Relevant Orders   Ambulatory referral to Psychiatry   Other Visit Diagnoses     B12 deficiency       Relevant Medications   cyanocobalamin (VITAMIN B12) injection 1,000 mcg (Completed)   Need for immunization against influenza       Relevant Orders   Flu Vaccine QUAD 3moIM (Fluarix, Fluzone & Alfiuria Quad PF) (Completed)        CEinar Pheasant MD

## 2022-07-03 ENCOUNTER — Encounter: Payer: Self-pay | Admitting: Internal Medicine

## 2022-07-03 NOTE — Assessment & Plan Note (Signed)
Continue crestor.  Low cholesterol diet and exercise. Follow lipid panel and liver function tests.   

## 2022-07-03 NOTE — Assessment & Plan Note (Signed)
On metoprolol.  Helping.

## 2022-07-03 NOTE — Assessment & Plan Note (Signed)
Seeing Dr Posey Pronto.   On cimzia and plaquenil.  Overall has done well on this medication.  Follow.

## 2022-07-03 NOTE — Assessment & Plan Note (Signed)
Increased stress.  On wellbutrin.  Requiring 1/2 xanax daily now.  Discussed.  Feels needs something more at this time.  Discussed further treatment and evaluation.  Agreeable to referral to psychiatry.

## 2022-07-03 NOTE — Assessment & Plan Note (Signed)
Colonoscopy 06/2021 - recommended f/u in 3 years.  

## 2022-07-04 ENCOUNTER — Other Ambulatory Visit: Payer: Self-pay | Admitting: Internal Medicine

## 2022-07-04 ENCOUNTER — Other Ambulatory Visit: Payer: Self-pay

## 2022-07-04 ENCOUNTER — Other Ambulatory Visit: Payer: Self-pay | Admitting: Family

## 2022-07-04 MED FILL — Fluticasone Propionate Nasal Susp 50 MCG/ACT: NASAL | 30 days supply | Qty: 16 | Fill #1 | Status: AC

## 2022-07-05 ENCOUNTER — Other Ambulatory Visit: Payer: Self-pay

## 2022-07-05 MED FILL — Gabapentin Cap 300 MG: ORAL | 30 days supply | Qty: 90 | Fill #0 | Status: AC

## 2022-07-09 DIAGNOSIS — Z79899 Other long term (current) drug therapy: Secondary | ICD-10-CM | POA: Diagnosis not present

## 2022-07-11 ENCOUNTER — Other Ambulatory Visit: Payer: Self-pay

## 2022-07-12 ENCOUNTER — Other Ambulatory Visit: Payer: Self-pay

## 2022-07-13 ENCOUNTER — Other Ambulatory Visit: Payer: Self-pay

## 2022-07-13 DIAGNOSIS — F411 Generalized anxiety disorder: Secondary | ICD-10-CM | POA: Diagnosis not present

## 2022-07-13 MED ORDER — HYDROXYCHLOROQUINE SULFATE 200 MG PO TABS
ORAL_TABLET | ORAL | 1 refills | Status: DC
  Filled 2022-07-13: qty 180, 90d supply, fill #0
  Filled 2022-10-11: qty 180, 90d supply, fill #1

## 2022-07-14 ENCOUNTER — Other Ambulatory Visit: Payer: Self-pay

## 2022-07-15 ENCOUNTER — Other Ambulatory Visit: Payer: Self-pay

## 2022-07-18 ENCOUNTER — Other Ambulatory Visit: Payer: Self-pay | Admitting: Family

## 2022-07-20 ENCOUNTER — Other Ambulatory Visit: Payer: Self-pay

## 2022-07-20 ENCOUNTER — Other Ambulatory Visit: Payer: Self-pay | Admitting: Internal Medicine

## 2022-07-20 DIAGNOSIS — F411 Generalized anxiety disorder: Secondary | ICD-10-CM | POA: Diagnosis not present

## 2022-07-20 DIAGNOSIS — M0609 Rheumatoid arthritis without rheumatoid factor, multiple sites: Secondary | ICD-10-CM | POA: Diagnosis not present

## 2022-07-21 ENCOUNTER — Other Ambulatory Visit: Payer: Self-pay

## 2022-07-21 MED FILL — Alprazolam Tab 0.25 MG: ORAL | 30 days supply | Qty: 30 | Fill #0 | Status: AC

## 2022-07-21 NOTE — Telephone Encounter (Signed)
Rx ok'd for xanax #30 with no refills.  PDMP reviewed.

## 2022-07-27 DIAGNOSIS — Z8669 Personal history of other diseases of the nervous system and sense organs: Secondary | ICD-10-CM | POA: Diagnosis not present

## 2022-07-27 DIAGNOSIS — M0560 Rheumatoid arthritis of unspecified site with involvement of other organs and systems: Secondary | ICD-10-CM | POA: Diagnosis not present

## 2022-07-27 DIAGNOSIS — Z79899 Other long term (current) drug therapy: Secondary | ICD-10-CM | POA: Diagnosis not present

## 2022-07-27 DIAGNOSIS — Z135 Encounter for screening for eye and ear disorders: Secondary | ICD-10-CM | POA: Diagnosis not present

## 2022-07-27 DIAGNOSIS — H5213 Myopia, bilateral: Secondary | ICD-10-CM | POA: Diagnosis not present

## 2022-07-27 DIAGNOSIS — H43392 Other vitreous opacities, left eye: Secondary | ICD-10-CM | POA: Diagnosis not present

## 2022-07-28 ENCOUNTER — Other Ambulatory Visit: Payer: Self-pay

## 2022-07-28 MED ORDER — COVID-19 MRNA VAC-TRIS(PFIZER) 30 MCG/0.3ML IM SUSY
PREFILLED_SYRINGE | INTRAMUSCULAR | 0 refills | Status: DC
  Filled 2022-07-30: qty 0.3, 1d supply, fill #0

## 2022-07-30 ENCOUNTER — Other Ambulatory Visit: Payer: Self-pay

## 2022-08-01 MED FILL — Gabapentin Cap 300 MG: ORAL | 30 days supply | Qty: 90 | Fill #1 | Status: AC

## 2022-08-02 ENCOUNTER — Other Ambulatory Visit: Payer: Self-pay

## 2022-08-02 DIAGNOSIS — M170 Bilateral primary osteoarthritis of knee: Secondary | ICD-10-CM | POA: Diagnosis not present

## 2022-08-03 DIAGNOSIS — F411 Generalized anxiety disorder: Secondary | ICD-10-CM | POA: Diagnosis not present

## 2022-08-05 ENCOUNTER — Other Ambulatory Visit: Payer: Self-pay

## 2022-08-05 DIAGNOSIS — F419 Anxiety disorder, unspecified: Secondary | ICD-10-CM | POA: Diagnosis not present

## 2022-08-05 DIAGNOSIS — F5105 Insomnia due to other mental disorder: Secondary | ICD-10-CM | POA: Diagnosis not present

## 2022-08-05 DIAGNOSIS — F320 Major depressive disorder, single episode, mild: Secondary | ICD-10-CM | POA: Diagnosis not present

## 2022-08-05 MED ORDER — SERTRALINE HCL 50 MG PO TABS
50.0000 mg | ORAL_TABLET | Freq: Every day | ORAL | 0 refills | Status: DC
  Filled 2022-08-05: qty 30, 30d supply, fill #0

## 2022-08-09 DIAGNOSIS — M170 Bilateral primary osteoarthritis of knee: Secondary | ICD-10-CM | POA: Diagnosis not present

## 2022-08-15 ENCOUNTER — Other Ambulatory Visit: Payer: Self-pay

## 2022-08-16 ENCOUNTER — Other Ambulatory Visit: Payer: Self-pay

## 2022-08-16 DIAGNOSIS — M170 Bilateral primary osteoarthritis of knee: Secondary | ICD-10-CM | POA: Diagnosis not present

## 2022-08-16 MED ORDER — MELOXICAM 7.5 MG PO TABS
7.5000 mg | ORAL_TABLET | Freq: Two times a day (BID) | ORAL | 3 refills | Status: DC
  Filled 2022-08-16: qty 60, 30d supply, fill #0
  Filled 2022-09-12: qty 60, 30d supply, fill #1
  Filled 2022-10-11: qty 60, 30d supply, fill #2
  Filled 2022-11-09: qty 60, 30d supply, fill #3

## 2022-08-17 DIAGNOSIS — F411 Generalized anxiety disorder: Secondary | ICD-10-CM | POA: Diagnosis not present

## 2022-08-17 DIAGNOSIS — M0609 Rheumatoid arthritis without rheumatoid factor, multiple sites: Secondary | ICD-10-CM | POA: Diagnosis not present

## 2022-08-20 ENCOUNTER — Other Ambulatory Visit: Payer: Self-pay

## 2022-08-20 DIAGNOSIS — F5105 Insomnia due to other mental disorder: Secondary | ICD-10-CM | POA: Diagnosis not present

## 2022-08-20 DIAGNOSIS — F320 Major depressive disorder, single episode, mild: Secondary | ICD-10-CM | POA: Diagnosis not present

## 2022-08-20 DIAGNOSIS — F419 Anxiety disorder, unspecified: Secondary | ICD-10-CM | POA: Diagnosis not present

## 2022-08-20 MED ORDER — BUPROPION HCL ER (SR) 150 MG PO TB12
ORAL_TABLET | ORAL | 0 refills | Status: DC
  Filled 2022-08-20: qty 180, 90d supply, fill #0

## 2022-08-24 DIAGNOSIS — F411 Generalized anxiety disorder: Secondary | ICD-10-CM | POA: Diagnosis not present

## 2022-08-25 ENCOUNTER — Other Ambulatory Visit: Payer: Self-pay | Admitting: Surgery

## 2022-08-30 ENCOUNTER — Other Ambulatory Visit: Payer: Self-pay | Admitting: Internal Medicine

## 2022-08-30 ENCOUNTER — Other Ambulatory Visit: Payer: Self-pay

## 2022-08-30 MED FILL — Fluticasone Propionate Nasal Susp 50 MCG/ACT: NASAL | 30 days supply | Qty: 16 | Fill #2 | Status: AC

## 2022-08-31 DIAGNOSIS — Z79899 Other long term (current) drug therapy: Secondary | ICD-10-CM | POA: Diagnosis not present

## 2022-08-31 DIAGNOSIS — M0609 Rheumatoid arthritis without rheumatoid factor, multiple sites: Secondary | ICD-10-CM | POA: Diagnosis not present

## 2022-08-31 DIAGNOSIS — M159 Polyosteoarthritis, unspecified: Secondary | ICD-10-CM | POA: Diagnosis not present

## 2022-09-02 ENCOUNTER — Ambulatory Visit (INDEPENDENT_AMBULATORY_CARE_PROVIDER_SITE_OTHER): Payer: 59

## 2022-09-02 DIAGNOSIS — E538 Deficiency of other specified B group vitamins: Secondary | ICD-10-CM

## 2022-09-02 MED ORDER — CYANOCOBALAMIN 1000 MCG/ML IJ SOLN
1000.0000 ug | Freq: Once | INTRAMUSCULAR | Status: AC
  Administered 2022-09-02: 1000 ug via INTRAMUSCULAR

## 2022-09-02 NOTE — Progress Notes (Signed)
Pt presented for their vitamin B12 injection. Pt was identified through two identifiers. Pt tolerated shot well in their left  deltoid.  

## 2022-09-03 ENCOUNTER — Other Ambulatory Visit: Payer: Self-pay

## 2022-09-03 ENCOUNTER — Inpatient Hospital Stay: Admission: RE | Admit: 2022-09-03 | Payer: 59 | Source: Ambulatory Visit

## 2022-09-03 ENCOUNTER — Encounter: Payer: Self-pay | Admitting: Internal Medicine

## 2022-09-03 MED ORDER — GABAPENTIN 300 MG PO CAPS
ORAL_CAPSULE | ORAL | 1 refills | Status: DC
  Filled 2022-09-03: qty 90, 30d supply, fill #0
  Filled 2022-10-05: qty 90, 30d supply, fill #1

## 2022-09-06 ENCOUNTER — Other Ambulatory Visit: Payer: Self-pay

## 2022-09-07 DIAGNOSIS — F411 Generalized anxiety disorder: Secondary | ICD-10-CM | POA: Diagnosis not present

## 2022-09-08 ENCOUNTER — Ambulatory Visit: Admit: 2022-09-08 | Payer: 59 | Admitting: Surgery

## 2022-09-08 SURGERY — CARPOMETACARPAL (CMC) FUSION OF THUMB
Anesthesia: Choice | Site: Thumb | Laterality: Right

## 2022-09-12 ENCOUNTER — Other Ambulatory Visit: Payer: Self-pay | Admitting: Internal Medicine

## 2022-09-13 ENCOUNTER — Other Ambulatory Visit: Payer: Self-pay

## 2022-09-13 MED ORDER — FERROUS SULFATE 325 (65 FE) MG PO TABS
325.0000 mg | ORAL_TABLET | Freq: Every day | ORAL | 1 refills | Status: AC
  Filled 2022-09-13: qty 30, 30d supply, fill #0

## 2022-09-13 MED ORDER — CETIRIZINE HCL 10 MG PO TABS
10.0000 mg | ORAL_TABLET | Freq: Every day | ORAL | 1 refills | Status: AC
  Filled 2022-09-13 – 2022-11-09 (×2): qty 30, 30d supply, fill #0

## 2022-09-16 DIAGNOSIS — M0609 Rheumatoid arthritis without rheumatoid factor, multiple sites: Secondary | ICD-10-CM | POA: Diagnosis not present

## 2022-10-05 ENCOUNTER — Other Ambulatory Visit: Payer: Self-pay

## 2022-10-05 ENCOUNTER — Other Ambulatory Visit: Payer: Self-pay | Admitting: Internal Medicine

## 2022-10-05 DIAGNOSIS — F411 Generalized anxiety disorder: Secondary | ICD-10-CM | POA: Diagnosis not present

## 2022-10-05 MED FILL — Fluticasone Propionate Nasal Susp 50 MCG/ACT: NASAL | 30 days supply | Qty: 16 | Fill #0 | Status: AC

## 2022-10-07 ENCOUNTER — Other Ambulatory Visit: Payer: Self-pay | Admitting: Internal Medicine

## 2022-10-07 ENCOUNTER — Ambulatory Visit (INDEPENDENT_AMBULATORY_CARE_PROVIDER_SITE_OTHER): Payer: 59

## 2022-10-07 DIAGNOSIS — Z1231 Encounter for screening mammogram for malignant neoplasm of breast: Secondary | ICD-10-CM

## 2022-10-07 DIAGNOSIS — E538 Deficiency of other specified B group vitamins: Secondary | ICD-10-CM | POA: Diagnosis not present

## 2022-10-07 MED ORDER — CYANOCOBALAMIN 1000 MCG/ML IJ SOLN
1000.0000 ug | Freq: Once | INTRAMUSCULAR | Status: AC
  Administered 2022-10-07: 1000 ug via INTRAMUSCULAR

## 2022-10-07 NOTE — Progress Notes (Signed)
Patient presented for B 12 injection to left deltoid, patient voiced no concerns nor showed any signs of distress during injection. 

## 2022-10-08 ENCOUNTER — Other Ambulatory Visit: Payer: Self-pay

## 2022-10-11 ENCOUNTER — Other Ambulatory Visit: Payer: Self-pay

## 2022-10-11 MED FILL — Fluticasone Propionate Nasal Susp 50 MCG/ACT: NASAL | 30 days supply | Qty: 16 | Fill #1 | Status: CN

## 2022-10-12 DIAGNOSIS — F411 Generalized anxiety disorder: Secondary | ICD-10-CM | POA: Diagnosis not present

## 2022-10-13 ENCOUNTER — Other Ambulatory Visit: Payer: Self-pay

## 2022-10-13 ENCOUNTER — Encounter: Payer: Self-pay | Admitting: Internal Medicine

## 2022-10-13 DIAGNOSIS — R300 Dysuria: Secondary | ICD-10-CM

## 2022-10-13 NOTE — Telephone Encounter (Signed)
See if she can come in and leave a urine sample - prior to her visit Friday.  Keep [place] posted.  If any acute change, will need to be seen.

## 2022-10-14 ENCOUNTER — Other Ambulatory Visit: Payer: 59

## 2022-10-14 ENCOUNTER — Other Ambulatory Visit (INDEPENDENT_AMBULATORY_CARE_PROVIDER_SITE_OTHER): Payer: 59

## 2022-10-14 DIAGNOSIS — R300 Dysuria: Secondary | ICD-10-CM | POA: Diagnosis not present

## 2022-10-14 LAB — URINALYSIS, ROUTINE W REFLEX MICROSCOPIC
Bilirubin Urine: NEGATIVE
Hgb urine dipstick: NEGATIVE
Ketones, ur: NEGATIVE
Leukocytes,Ua: NEGATIVE
Nitrite: NEGATIVE
Specific Gravity, Urine: >=1.03 — AB (ref 1.000–1.030)
Total Protein, Urine: NEGATIVE
Urine Glucose: NEGATIVE
Urobilinogen, UA: 0.2 (ref 0.0–1.0)
pH: 6 (ref 5.0–8.0)

## 2022-10-14 NOTE — Telephone Encounter (Signed)
She is scheduled for a lab appointment today and I ordered a UA with micro and a culture. Do we need anything else?

## 2022-10-14 NOTE — Telephone Encounter (Signed)
Urine collected at lab appt

## 2022-10-14 NOTE — Telephone Encounter (Signed)
She has future labs in the system, but I assume she would have eaten when comes in for urine.  We can do these tomorrow if needed.  I would like to hold on treating until I see the urine and examine her, if she is ok to wait until tomorrow am.

## 2022-10-15 ENCOUNTER — Other Ambulatory Visit: Payer: Self-pay

## 2022-10-15 ENCOUNTER — Encounter: Payer: Self-pay | Admitting: Internal Medicine

## 2022-10-15 ENCOUNTER — Other Ambulatory Visit (HOSPITAL_COMMUNITY)
Admission: RE | Admit: 2022-10-15 | Discharge: 2022-10-15 | Disposition: A | Discharge status: Home / Self Care | Payer: 59 | Source: Ambulatory Visit | Attending: Internal Medicine | Admitting: Internal Medicine

## 2022-10-15 ENCOUNTER — Ambulatory Visit (INDEPENDENT_AMBULATORY_CARE_PROVIDER_SITE_OTHER): Payer: 59 | Admitting: Internal Medicine

## 2022-10-15 VITALS — BP 106/70 | HR 73 | Temp 97.6°F | Resp 16 | Ht 62.0 in | Wt 202.8 lb

## 2022-10-15 DIAGNOSIS — N760 Acute vaginitis: Secondary | ICD-10-CM | POA: Diagnosis not present

## 2022-10-15 DIAGNOSIS — M069 Rheumatoid arthritis, unspecified: Secondary | ICD-10-CM

## 2022-10-15 MED ORDER — FLUCONAZOLE 150 MG PO TABS
ORAL_TABLET | ORAL | 0 refills | Status: DC
  Filled 2022-10-15: qty 2, 4d supply, fill #0

## 2022-10-15 NOTE — Assessment & Plan Note (Signed)
Given symptoms improved with nystatin and now returning, will treat with diflucan as directed.  KOH/wet prep obtained.  Urinalysis clear.  Discussed results.  Will send for culture.  Call with update.

## 2022-10-15 NOTE — Assessment & Plan Note (Signed)
Seeing Dr Posey Pronto.   On cimzia and plaquenil.  Overall has done well on this medication.  Follow. Held cimzia yesterday given concern regarding infection.  Follow.

## 2022-10-15 NOTE — Progress Notes (Signed)
Subjective:    [name], female    DOB: [birth year], 52 y.o.   MRN: [id number]  Patient here for  Chief Complaint  Patient presents with   Vaginitis    HPI Work in appt - vaginal irritation. Sees Dr Posey Pronto - f/u RA - on cimzia. Symptoms started approximately one week ago.  Reports noticing vaginal irritation and dryness.  No vaginal bleeding.  Started using nystatin.  Noted improvement initially and then noticed some increased pressure and irritation.  Symptoms have improved.  No abdominal pain.  Bowels stable.  Intermittent flares, but stable.    Past Medical History:  Diagnosis Date   Allergy    Blood in stool    H/O   Complication of anesthesia    History of chicken pox    Hx: UTI (urinary tract infection)    Hyperlipidemia    PONV (postoperative nausea and vomiting)    Past Surgical History:  Procedure Laterality Date   AUGMENTATION MAMMAPLASTY  2001   CESAREAN SECTION  2007 and 2009   COLONOSCOPY WITH PROPOFOL N/A 06/19/2021   Procedure: COLONOSCOPY WITH PROPOFOL;  Surgeon: Virgel Manifold, MD;  Location: [facility] ENDOSCOPY;  Service: Endoscopy;  Laterality: N/A;   tummy tuck  2001   Family History  Problem Relation Age of Onset   Healthy Mother    Hypertension Mother    Cancer Father        non hogkins lymphoma   Atrial fibrillation Father    Heart disease Father    Hypertension Father    Arthritis Sister    Asthma Sister    Hypertension Sister    Healthy Daughter    Healthy Son    Alzheimer's disease Maternal Grandmother    Healthy Sister    Arthritis Maternal Grandfather    Colon cancer Paternal Uncle 78   Breast cancer Neg Hx    Social History   Socioeconomic History   Marital status: Married    Spouse name: Not on file   Number of children: Not on file   Years of education: Not on file   Highest education level: Not on file  Occupational History   Not on file  Tobacco Use   Smoking status: Never   Smokeless tobacco: Never   Vaping Use   Vaping Use: Never used  Substance and Sexual Activity   Alcohol use: Yes    Alcohol/week: 0.0 standard drinks of alcohol    Comment: occasional   Drug use: No   Sexual activity: Not on file  Other Topics Concern   Not on file  Social History Narrative   Not on file   Social Determinants of Health   Financial Resource Strain: Not on file  Food Insecurity: Not on file  Transportation Needs: Not on file  Physical Activity: Not on file  Stress: Not on file  Social Connections: Not on file     Review of Systems  Constitutional:  Negative for appetite change and fever.  HENT:  Negative for congestion and sinus pressure.   Respiratory:  Negative for cough, chest tightness and shortness of breath.   Cardiovascular:  Negative for chest pain, palpitations and leg swelling.  Gastrointestinal:  Negative for abdominal pain, diarrhea, nausea and vomiting.  Genitourinary:  Negative for difficulty urinating.       Some burning when urination - urine touching tissue.  No increased vaginal discharge.   Musculoskeletal:  Negative for joint swelling and myalgias.  Skin:  Negative for  color change and rash.  Neurological:  Negative for dizziness and headaches.  Psychiatric/Behavioral:  Negative for agitation and dysphoric mood.        Objective:     BP 106/70   Pulse 73   Temp 97.6 F (36.4 C)   Resp 16   Ht '5\' 2"'$  (1.575 m)   Wt 202 lb 12.8 oz (92 kg)   SpO2 97%   BMI 37.09 kg/m  Wt Readings from Last 3 Encounters:  10/15/22 202 lb 12.8 oz (92 kg)  07/02/22 202 lb 9.6 oz (91.9 kg)  04/08/22 200 lb 4 oz (90.8 kg)    Physical Exam Vitals reviewed.  Constitutional:      General: She is not in acute distress.    Appearance: Normal appearance.  HENT:     Head: Normocephalic and atraumatic.     Right Ear: External ear normal.     Left Ear: External ear normal.  Eyes:     General: No scleral icterus.       Right eye: No discharge.        Left eye: No  discharge.     Conjunctiva/sclera: Conjunctivae normal.  Neck:     Thyroid: No thyromegaly.  Cardiovascular:     Rate and Rhythm: Normal rate and regular rhythm.  Pulmonary:     Effort: No respiratory distress.     Breath sounds: Normal breath sounds. No wheezing.  Abdominal:     General: Bowel sounds are normal.     Palpations: Abdomen is soft.     Tenderness: There is no abdominal tenderness.  Genitourinary:    Comments: Normal external genitalia.  Vaginal vault without lesions.  Cervix identified.  Some discharge.  KOH/wet prep obtained. Could not appreciate any adnexal masses or tenderness.   Musculoskeletal:        General: No swelling or tenderness.     Cervical back: Neck supple. No tenderness.  Lymphadenopathy:     Cervical: No cervical adenopathy.  Skin:    Findings: No erythema or rash.  Neurological:     Mental Status: She is alert.  Psychiatric:        Mood and Affect: Mood normal.        Behavior: Behavior normal.      Outpatient Encounter Medications as of 10/15/2022  Medication Sig   fluconazole (DIFLUCAN) 150 MG tablet Take one tablet x 1 and then may repeat x 1 in three days if persistent symptoms.   acetaminophen (TYLENOL) 500 MG tablet Take 500 mg by mouth every 6 (six) hours as needed.   ALPRAZolam (XANAX) 0.25 MG tablet TAKE 1 TABLET BY MOUTH ONCE DAILY AS NEEDED FOR ANXIETY   buPROPion (WELLBUTRIN SR) 150 MG 12 hr tablet Take 1 tablet by mouth twice daily at 8:24MP and 5:36RW   Certolizumab Pegol 2 X 200 MG/ML PSKT Inject into the skin every 30 (thirty) days.   cetirizine (ZYRTEC) 10 MG tablet Take 1 tablet (10 mg total) by mouth daily.   Cyanocobalamin (B-12 IJ) Inject as directed every 30 (thirty) days.   famotidine (PEPCID) 20 MG tablet Take 20 mg by mouth 2 (two) times daily.   ferrous sulfate 325 (65 FE) MG tablet Take 1 tablet (325 mg total) by mouth daily with breakfast.   fluticasone (FLONASE) 50 MCG/ACT nasal spray USE 2 SPRAYS IN EACH NOSTRIL  ONCE DAILY   gabapentin (NEURONTIN) 300 MG capsule TAKE 3 CAPSULES BY MOUTH EVERY EVENING   hydroxychloroquine (PLAQUENIL) 200 MG tablet TAKE  1 TABLET BY MOUTH TWICE DAILY FOR INFLAMMATORY ARTHRITIS   Melatonin 10 MG TABS Take 10 mg by mouth at bedtime.   meloxicam (MOBIC) 7.5 MG tablet TAKE 1 TABLET BY MOUTH TWICE DAILY WITH MEALS   metoprolol succinate (TOPROL XL) 25 MG 24 hr tablet Take 1 tablet (25 mg total) by mouth daily.   rosuvastatin (CRESTOR) 10 MG tablet TAKE 1 TABLET BY MOUTH DAILY   [DISCONTINUED] COVID-19 mRNA bivalent vaccine, Pfizer, (PFIZER COVID-19 VAC BIVALENT) injection Inject into the muscle.   [DISCONTINUED] COVID-19 mRNA vaccine 2023-2024 (COMIRNATY) syringe Inject into the muscle.   [DISCONTINUED] hydroxychloroquine (PLAQUENIL) 200 MG tablet TAKE 1 TABLET BY MOUTH TWICE DAILY FOR INFLAMMATORY ARTHRITIS   [DISCONTINUED] sertraline (ZOLOFT) 50 MG tablet Take 1 tablet (50 mg total) by mouth daily with breakfast.   No facility-administered encounter medications on file as of 10/15/2022.     Lab Results  Component Value Date   WBC 4.6 09/01/2021   HGB 13.3 09/01/2021   HCT 40.0 09/01/2021   PLT 260.0 09/01/2021   GLUCOSE 82 06/18/2022   CHOL 147 06/18/2022   TRIG 153.0 (H) 06/18/2022   HDL 56.00 06/18/2022   LDLDIRECT 87.0 04/03/2019   LDLCALC 60 06/18/2022   ALT 13 06/18/2022   AST 14 06/18/2022   NA 142 06/18/2022   K 4.0 06/18/2022   CL 107 06/18/2022   CREATININE 0.84 06/18/2022   BUN 13 06/18/2022   CO2 27 06/18/2022   TSH 1.78 09/01/2021    No results found.     Assessment & Plan:  Acute vaginitis Assessment & Plan: Given symptoms improved with nystatin and now returning, will treat with diflucan as directed.  KOH/wet prep obtained.  Urinalysis clear.  Discussed results.  Will send for culture.  Call with update.   Orders: -     Cervicovaginal ancillary only  Rheumatoid arthritis, involving unspecified site, unspecified whether rheumatoid  factor present [facility]) Assessment & Plan: Seeing Dr Posey Pronto.   On cimzia and plaquenil.  Overall has done well on this medication.  Follow. Held cimzia yesterday given concern regarding infection.  Follow.    Other orders -     Fluconazole; Take one tablet x 1 and then may repeat x 1 in three days if persistent symptoms.  Dispense: 2 tablet; Refill: 0     Einar Pheasant, MD

## 2022-10-16 LAB — URINE CULTURE
MICRO NUMBER:: 14473204
SPECIMEN QUALITY:: ADEQUATE

## 2022-10-18 ENCOUNTER — Other Ambulatory Visit: Payer: Self-pay

## 2022-10-18 LAB — CERVICOVAGINAL ANCILLARY ONLY
Bacterial Vaginitis (gardnerella): POSITIVE — AB
Candida Glabrata: NEGATIVE
Candida Vaginitis: NEGATIVE
Comment: NEGATIVE
Comment: NEGATIVE
Comment: NEGATIVE

## 2022-10-18 MED ORDER — METRONIDAZOLE 500 MG PO TABS
500.0000 mg | ORAL_TABLET | Freq: Two times a day (BID) | ORAL | 0 refills | Status: DC
  Filled 2022-10-18: qty 14, 7d supply, fill #0

## 2022-10-20 ENCOUNTER — Encounter: Payer: Self-pay | Admitting: Internal Medicine

## 2022-10-21 ENCOUNTER — Other Ambulatory Visit: Payer: Self-pay

## 2022-10-21 MED ORDER — METRONIDAZOLE 0.75 % VA GEL
1.0000 | Freq: Two times a day (BID) | VAGINAL | 0 refills | Status: AC
  Filled 2022-10-21: qty 70, 5d supply, fill #0

## 2022-10-21 NOTE — Telephone Encounter (Signed)
Metrogel sent to [facility]  LM for pt to cb to advise and check status

## 2022-10-21 NOTE — Telephone Encounter (Signed)
Stop oral metronidazole.  Ok to send in metrogel vaginal one applicator bid x 5 days.  Need to confirm doing ok.  Let me know if I need to send in rx

## 2022-10-21 NOTE — Telephone Encounter (Signed)
Pt advised metrogel sent Stated is feeling much better since not taking the Flagyl. No issues today.  Will pick up metrogel tonight

## 2022-10-21 NOTE — Telephone Encounter (Signed)
Pt returns Marissa call, transferred.

## 2022-10-22 ENCOUNTER — Other Ambulatory Visit: Payer: Self-pay

## 2022-10-22 DIAGNOSIS — F419 Anxiety disorder, unspecified: Secondary | ICD-10-CM | POA: Diagnosis not present

## 2022-10-22 DIAGNOSIS — F320 Major depressive disorder, single episode, mild: Secondary | ICD-10-CM | POA: Diagnosis not present

## 2022-10-22 DIAGNOSIS — F5105 Insomnia due to other mental disorder: Secondary | ICD-10-CM | POA: Diagnosis not present

## 2022-10-22 MED ORDER — BUPROPION HCL ER (SR) 150 MG PO TB12
150.0000 mg | ORAL_TABLET | Freq: Two times a day (BID) | ORAL | 0 refills | Status: DC
  Filled 2022-10-22: qty 180, 90d supply, fill #0

## 2022-10-26 DIAGNOSIS — F411 Generalized anxiety disorder: Secondary | ICD-10-CM | POA: Diagnosis not present

## 2022-10-27 ENCOUNTER — Ambulatory Visit
Admission: RE | Admit: 2022-10-27 | Discharge: 2022-10-27 | Disposition: A | Discharge status: Home / Self Care | Payer: 59 | Source: Ambulatory Visit | Attending: Internal Medicine | Admitting: Internal Medicine

## 2022-10-27 DIAGNOSIS — Z1231 Encounter for screening mammogram for malignant neoplasm of breast: Secondary | ICD-10-CM | POA: Diagnosis not present

## 2022-10-29 ENCOUNTER — Other Ambulatory Visit (INDEPENDENT_AMBULATORY_CARE_PROVIDER_SITE_OTHER): Payer: 59

## 2022-10-29 ENCOUNTER — Encounter: Payer: Self-pay | Admitting: Internal Medicine

## 2022-10-29 DIAGNOSIS — E7800 Pure hypercholesterolemia, unspecified: Secondary | ICD-10-CM | POA: Diagnosis not present

## 2022-10-29 DIAGNOSIS — I471 Supraventricular tachycardia, unspecified: Secondary | ICD-10-CM

## 2022-10-29 DIAGNOSIS — M069 Rheumatoid arthritis, unspecified: Secondary | ICD-10-CM | POA: Diagnosis not present

## 2022-10-29 LAB — LIPID PANEL
Cholesterol: 121 mg/dL (ref 0–200)
HDL: 42.2 mg/dL (ref >39.00)
LDL Cholesterol: 60 mg/dL (ref 0–99)
NonHDL: 78.62
Total CHOL/HDL Ratio: 3
Triglycerides: 91 mg/dL (ref 0.0–149.0)
VLDL: 18.2 mg/dL (ref 0.0–40.0)

## 2022-10-29 LAB — HEPATIC FUNCTION PANEL
ALT: 14 U/L (ref 0–35)
AST: 15 U/L (ref 0–37)
Albumin: 3.9 g/dL (ref 3.5–5.2)
Alkaline Phosphatase: 59 U/L (ref 39–117)
Bilirubin, Direct: 0.1 mg/dL (ref 0.0–0.3)
Total Bilirubin: 0.5 mg/dL (ref 0.2–1.2)
Total Protein: 5.5 g/dL — ABNORMAL LOW (ref 6.0–8.3)

## 2022-10-29 LAB — CBC WITH DIFFERENTIAL/PLATELET
Basophils Absolute: 0 10*3/uL (ref 0.0–0.1)
Basophils Relative: 0.3 % (ref 0.0–3.0)
Eosinophils Absolute: 0.2 10*3/uL (ref 0.0–0.7)
Eosinophils Relative: 2.6 % (ref 0.0–5.0)
HCT: 39.4 % (ref 36.0–46.0)
Hemoglobin: 13.2 g/dL (ref 12.0–15.0)
Lymphocytes Relative: 28.1 % (ref 12.0–46.0)
Lymphs Abs: 1.8 10*3/uL (ref 0.7–4.0)
MCHC: 33.6 g/dL (ref 30.0–36.0)
MCV: 89.4 fl (ref 78.0–100.0)
Monocytes Absolute: 0.5 10*3/uL (ref 0.1–1.0)
Monocytes Relative: 7.8 % (ref 3.0–12.0)
Neutro Abs: 4 10*3/uL (ref 1.4–7.7)
Neutrophils Relative %: 61.2 % (ref 43.0–77.0)
Platelets: 271 10*3/uL (ref 150.0–400.0)
RBC: 4.41 Mil/uL (ref 3.87–5.11)
RDW: 11.8 % (ref 11.5–15.5)
WBC: 6.5 10*3/uL (ref 4.0–10.5)

## 2022-10-29 LAB — BASIC METABOLIC PANEL
BUN: 16 mg/dL (ref 6–23)
CO2: 28 mEq/L (ref 19–32)
Calcium: 8.6 mg/dL (ref 8.4–10.5)
Chloride: 105 mEq/L (ref 96–112)
Creatinine, Ser: 0.76 mg/dL (ref 0.40–1.20)
GFR: 90.58 mL/min (ref >60.00)
Glucose, Bld: 84 mg/dL (ref 70–99)
Potassium: 3.9 mEq/L (ref 3.5–5.1)
Sodium: 142 mEq/L (ref 135–145)

## 2022-10-29 LAB — TSH: TSH: 2.23 u[IU]/mL (ref 0.35–5.50)

## 2022-10-29 NOTE — Telephone Encounter (Signed)
Patient states that GI symptoms seem to have subsided since completing medication. Will continue to monitor at home and has f/u appt next week.

## 2022-10-29 NOTE — Telephone Encounter (Signed)
Thank her for letting us know.  Regarding her GI issues, please clarify symptoms.  Abdominal pain? Diarrhea?  Do recommend bland foods and advance diet slowly.  Can take probiotic daily.  If acute symptoms, needs to be evaluated.

## 2022-11-01 ENCOUNTER — Other Ambulatory Visit: Payer: Self-pay | Admitting: Internal Medicine

## 2022-11-02 ENCOUNTER — Other Ambulatory Visit: Payer: Self-pay

## 2022-11-02 MED ORDER — GABAPENTIN 300 MG PO CAPS
900.0000 mg | ORAL_CAPSULE | Freq: Every evening | ORAL | 1 refills | Status: DC
  Filled 2022-11-02: qty 90, 30d supply, fill #0
  Filled 2022-11-30: qty 90, 30d supply, fill #1

## 2022-11-04 ENCOUNTER — Encounter: Payer: Self-pay | Admitting: Internal Medicine

## 2022-11-04 ENCOUNTER — Other Ambulatory Visit: Payer: Self-pay

## 2022-11-04 ENCOUNTER — Ambulatory Visit: Payer: 59 | Admitting: Internal Medicine

## 2022-11-04 VITALS — BP 128/74 | HR 69 | Temp 98.0°F | Resp 16 | Ht 62.0 in | Wt 205.2 lb

## 2022-11-04 DIAGNOSIS — F439 Reaction to severe stress, unspecified: Secondary | ICD-10-CM | POA: Diagnosis not present

## 2022-11-04 DIAGNOSIS — M069 Rheumatoid arthritis, unspecified: Secondary | ICD-10-CM | POA: Diagnosis not present

## 2022-11-04 DIAGNOSIS — D473 Essential (hemorrhagic) thrombocythemia: Secondary | ICD-10-CM | POA: Diagnosis not present

## 2022-11-04 DIAGNOSIS — E7800 Pure hypercholesterolemia, unspecified: Secondary | ICD-10-CM | POA: Diagnosis not present

## 2022-11-04 DIAGNOSIS — D649 Anemia, unspecified: Secondary | ICD-10-CM

## 2022-11-04 DIAGNOSIS — G479 Sleep disorder, unspecified: Secondary | ICD-10-CM | POA: Diagnosis not present

## 2022-11-04 DIAGNOSIS — Z1211 Encounter for screening for malignant neoplasm of colon: Secondary | ICD-10-CM | POA: Diagnosis not present

## 2022-11-04 DIAGNOSIS — N898 Other specified noninflammatory disorders of vagina: Secondary | ICD-10-CM

## 2022-11-04 DIAGNOSIS — I471 Supraventricular tachycardia, unspecified: Secondary | ICD-10-CM | POA: Diagnosis not present

## 2022-11-04 MED ORDER — ROSUVASTATIN CALCIUM 10 MG PO TABS
10.0000 mg | ORAL_TABLET | Freq: Every day | ORAL | 2 refills | Status: DC
  Filled 2022-11-04: qty 90, 90d supply, fill #0
  Filled 2023-03-01: qty 90, 90d supply, fill #1
  Filled 2023-06-01: qty 90, 90d supply, fill #2

## 2022-11-04 NOTE — Progress Notes (Signed)
Subjective:    [name], female    DOB: [birth year], 51 y.o.   MRN: [id number]  Patient here for  Chief Complaint  Patient presents with   Medical Management of Chronic Issues    HPI Here to follow up regarding increased stress and cholesterol.  Recently treated for vaginal irritation.  Had GI issues with the medication.  I had initially prescribed metrogel for treatment of G.Vag.  she preferred oral medication instead of the gel.  Was changed to flagyl.  Had intolerance to flagyl and was changed back to metrogel.  Still felt had intolerance to metrogel.  Off now.  Bowels doing better.  Vaginal symptoms improved.  Breathing stable.  No increased cough or congestion.  Does report tingling - bilateral feet.  On gabapentin.  Wants to monitor.   Sees Dr Posey Pronto - f/u RA - on cimzia. Has been off due to above issues.     Past Medical History:  Diagnosis Date   Allergy    Blood in stool    H/O   Complication of anesthesia    History of chicken pox    Hx: UTI (urinary tract infection)    Hyperlipidemia    PONV (postoperative nausea and vomiting)    Past Surgical History:  Procedure Laterality Date   AUGMENTATION MAMMAPLASTY  2001   CESAREAN SECTION  2007 and 2009   COLONOSCOPY WITH PROPOFOL N/A 06/19/2021   Procedure: COLONOSCOPY WITH PROPOFOL;  Surgeon: Virgel Manifold, MD;  Location: [facility] ENDOSCOPY;  Service: Endoscopy;  Laterality: N/A;   tummy tuck  2001   Family History  Problem Relation Age of Onset   Healthy Mother    Hypertension Mother    Cancer Father        non hogkins lymphoma   Atrial fibrillation Father    Heart disease Father    Hypertension Father    Arthritis Sister    Asthma Sister    Hypertension Sister    Healthy Daughter    Healthy Son    Alzheimer's disease Maternal Grandmother    Healthy Sister    Arthritis Maternal Grandfather    Colon cancer Paternal Uncle 81   Breast cancer Neg Hx    Social History   Socioeconomic History    Marital status: Married    Spouse name: Not on file   Number of children: Not on file   Years of education: Not on file   Highest education level: Not on file  Occupational History   Not on file  Tobacco Use   Smoking status: Never   Smokeless tobacco: Never  Vaping Use   Vaping Use: Never used  Substance and Sexual Activity   Alcohol use: Yes    Alcohol/week: 0.0 standard drinks of alcohol    Comment: occasional   Drug use: No   Sexual activity: Not on file  Other Topics Concern   Not on file  Social History Narrative   Not on file   Social Determinants of Health   Financial Resource Strain: Not on file  Food Insecurity: Not on file  Transportation Needs: Not on file  Physical Activity: Not on file  Stress: Not on file  Social Connections: Not on file     Review of Systems  Constitutional:  Negative for appetite change and unexpected weight change.  HENT:  Negative for congestion and sinus pressure.   Respiratory:  Negative for cough, chest tightness and shortness of breath.   Cardiovascular:  Negative  for chest pain and palpitations.  Gastrointestinal:  Negative for abdominal pain, diarrhea, nausea and vomiting.       GI symptoms improved.   Genitourinary:  Negative for difficulty urinating and dysuria.  Musculoskeletal:  Negative for joint swelling and myalgias.  Skin:  Negative for color change and rash.  Neurological:  Negative for dizziness and headaches.  Psychiatric/Behavioral:  Negative for agitation and dysphoric mood.        Objective:     BP 128/74   Pulse 69   Temp 98 F (36.7 C)   Resp 16   Ht '5\' 2"'$  (1.575 m)   Wt 205 lb 3.2 oz (93.1 kg)   SpO2 98%   BMI 37.53 kg/m  Wt Readings from Last 3 Encounters:  11/04/22 205 lb 3.2 oz (93.1 kg)  10/15/22 202 lb 12.8 oz (92 kg)  07/02/22 202 lb 9.6 oz (91.9 kg)    Physical Exam Vitals reviewed.  Constitutional:      General: She is not in acute distress.    Appearance: Normal appearance.   HENT:     Head: Normocephalic and atraumatic.     Right Ear: External ear normal.     Left Ear: External ear normal.  Eyes:     General: No scleral icterus.       Right eye: No discharge.        Left eye: No discharge.     Conjunctiva/sclera: Conjunctivae normal.  Neck:     Thyroid: No thyromegaly.  Cardiovascular:     Rate and Rhythm: Normal rate and regular rhythm.  Pulmonary:     Effort: No respiratory distress.     Breath sounds: Normal breath sounds. No wheezing.  Abdominal:     General: Bowel sounds are normal.     Palpations: Abdomen is soft.     Tenderness: There is no abdominal tenderness.  Musculoskeletal:        General: No swelling or tenderness.     Cervical back: Neck supple. No tenderness.  Lymphadenopathy:     Cervical: No cervical adenopathy.  Skin:    Findings: No erythema or rash.  Neurological:     Mental Status: She is alert.  Psychiatric:        Mood and Affect: Mood normal.        Behavior: Behavior normal.      Outpatient Encounter Medications as of 11/04/2022  Medication Sig   acetaminophen (TYLENOL) 500 MG tablet Take 500 mg by mouth every 6 (six) hours as needed.   ALPRAZolam (XANAX) 0.25 MG tablet TAKE 1 TABLET BY MOUTH ONCE DAILY AS NEEDED FOR ANXIETY   buPROPion (WELLBUTRIN SR) 150 MG 12 hr tablet Take 1 tablet by mouth twice daily at [id number] and [id number]   Certolizumab Pegol 2 X 200 MG/ML PSKT Inject into the skin every 30 (thirty) days.   cetirizine (ZYRTEC) 10 MG tablet Take 1 tablet (10 mg total) by mouth daily.   Cyanocobalamin (B-12 IJ) Inject as directed every 30 (thirty) days.   famotidine (PEPCID) 20 MG tablet Take 20 mg by mouth 2 (two) times daily.   ferrous sulfate 325 (65 FE) MG tablet Take 1 tablet (325 mg total) by mouth daily with breakfast.   fluticasone (FLONASE) 50 MCG/ACT nasal spray USE 2 SPRAYS IN EACH NOSTRIL ONCE DAILY   gabapentin (NEURONTIN) 300 MG capsule Take 3 capsules (900 mg total) by mouth every evening.    hydroxychloroquine (PLAQUENIL) 200 MG tablet TAKE 1 TABLET BY MOUTH TWICE  DAILY FOR INFLAMMATORY ARTHRITIS   Melatonin 10 MG TABS Take 10 mg by mouth at bedtime.   meloxicam (MOBIC) 7.5 MG tablet TAKE 1 TABLET BY MOUTH TWICE DAILY WITH MEALS   metoprolol succinate (TOPROL XL) 25 MG 24 hr tablet Take 1 tablet (25 mg total) by mouth daily.   rosuvastatin (CRESTOR) 10 MG tablet Take 1 tablet (10 mg total) by mouth daily.   [DISCONTINUED] buPROPion (WELLBUTRIN SR) 150 MG 12 hr tablet Take 1 tablet (150 mg total) by mouth 2 (two) times daily at 8am and 1pm.   [DISCONTINUED] fluconazole (DIFLUCAN) 150 MG tablet Take one tablet x 1 and then may repeat x 1 in three days if persistent symptoms.   [DISCONTINUED] rosuvastatin (CRESTOR) 10 MG tablet TAKE 1 TABLET BY MOUTH DAILY   No facility-administered encounter medications on file as of 11/04/2022.     Lab Results  Component Value Date   WBC 6.5 10/29/2022   HGB 13.2 10/29/2022   HCT 39.4 10/29/2022   PLT 271.0 10/29/2022   GLUCOSE 84 10/29/2022   CHOL 121 10/29/2022   TRIG 91.0 10/29/2022   HDL 42.20 10/29/2022   LDLDIRECT 87.0 04/03/2019   LDLCALC 60 10/29/2022   ALT 14 10/29/2022   AST 15 10/29/2022   NA 142 10/29/2022   K 3.9 10/29/2022   CL 105 10/29/2022   CREATININE 0.76 10/29/2022   BUN 16 10/29/2022   CO2 28 10/29/2022   TSH 2.23 10/29/2022    MM 3D SCREEN BREAST W/IMPLANT BILATERAL  Result Date: 10/28/2022 CLINICAL DATA:  Screening. EXAM: DIGITAL SCREENING BILATERAL MAMMOGRAM WITH IMPLANTS, CAD AND TOMOSYNTHESIS TECHNIQUE: Bilateral screening digital craniocaudal and mediolateral oblique mammograms were obtained. Bilateral screening digital breast tomosynthesis was performed. The images were evaluated with computer-aided detection. Standard and/or implant displaced views were performed. COMPARISON:  Previous exam(s). ACR Breast Density Category b: There are scattered areas of fibroglandular density. FINDINGS: The patient has  retroglandular implants. There are no findings suspicious for malignancy. IMPRESSION: No mammographic evidence of malignancy. A result letter of this screening mammogram will be mailed directly to the patient. RECOMMENDATION: Screening mammogram in one year. (Code:SM-B-01Y) BI-RADS CATEGORY  1: Negative. Electronically Signed   By: Fidela Salisbury M.D.   On: 10/28/2022 14:01       Assessment & Plan:  Hypercholesterolemia Assessment & Plan: Continue crestor.  Low cholesterol diet and exercise.  Follow lipid panel and liver function tests.   Lab Results  Component Value Date   CHOL 121 10/29/2022   HDL 42.20 10/29/2022   LDLCALC 60 10/29/2022   LDLDIRECT 87.0 04/03/2019   TRIG 91.0 10/29/2022   CHOLHDL 3 10/29/2022     Orders: -     Lipid panel; Future -     Hepatic function panel; Future -     Basic metabolic panel; Future  Anemia, unspecified type Assessment & Plan: Follow cbc.    Colon cancer screening Assessment & Plan: Colonoscopy 06/19/21 - f/u colonoscopy in 3 years    Difficulty sleeping Assessment & Plan: Continue gabapentin.    Essential (hemorrhagic) thrombocythemia ([place]) Assessment & Plan: Follow cbc.    Paroxysmal SVT (supraventricular tachycardia) Assessment & Plan: On metoprolol. Stable.    Rheumatoid arthritis, involving unspecified site, unspecified whether rheumatoid factor present [facility]) Assessment & Plan: Seeing Dr Posey Pronto.   On cimzia and plaquenil.  Overall has done well on this medication.  Follow. Held cimzia recently given infection. Continue f/u with rheumatology.    Screening for colon cancer Assessment &  Plan: Colonoscopy 06/2021 - recommended f/u in 3 years.    Stress Assessment & Plan: Continue wellbutrin.  Continue f/u with Dr Nicolasa Ducking.    Vaginal irritation Assessment & Plan: Improved.  Follow.  Off abx currently.    Other orders -     Rosuvastatin Calcium; Take 1 tablet (10 mg total) by mouth daily.  Dispense: 90 tablet;  Refill: 2     Einar Pheasant, MD

## 2022-11-04 NOTE — Assessment & Plan Note (Signed)
Continue crestor.  Low cholesterol diet and exercise.  Follow lipid panel and liver function tests.   Lab Results  Component Value Date   CHOL 121 10/29/2022   HDL 42.20 10/29/2022   LDLCALC 60 10/29/2022   LDLDIRECT 87.0 04/03/2019   TRIG 91.0 10/29/2022   CHOLHDL 3 10/29/2022

## 2022-11-08 ENCOUNTER — Ambulatory Visit (INDEPENDENT_AMBULATORY_CARE_PROVIDER_SITE_OTHER): Payer: 59

## 2022-11-08 DIAGNOSIS — E538 Deficiency of other specified B group vitamins: Secondary | ICD-10-CM

## 2022-11-08 MED ORDER — CYANOCOBALAMIN 1000 MCG/ML IJ SOLN
1000.0000 ug | Freq: Once | INTRAMUSCULAR | Status: AC
  Administered 2022-11-08: 1000 ug via INTRAMUSCULAR

## 2022-11-08 NOTE — Progress Notes (Signed)
Pt presented for their vitamin B12 injection. Pt was identified through two identifiers. Pt tolerated shot well in their left  deltoid.  

## 2022-11-09 ENCOUNTER — Other Ambulatory Visit: Payer: Self-pay

## 2022-11-09 DIAGNOSIS — F411 Generalized anxiety disorder: Secondary | ICD-10-CM | POA: Diagnosis not present

## 2022-11-09 MED FILL — Fluticasone Propionate Nasal Susp 50 MCG/ACT: NASAL | 30 days supply | Qty: 16 | Fill #1 | Status: AC

## 2022-11-11 DIAGNOSIS — M0609 Rheumatoid arthritis without rheumatoid factor, multiple sites: Secondary | ICD-10-CM | POA: Diagnosis not present

## 2022-11-14 ENCOUNTER — Encounter: Payer: Self-pay | Admitting: Internal Medicine

## 2022-11-14 NOTE — Assessment & Plan Note (Signed)
Colonoscopy 06/19/21 - f/u colonoscopy in 3 years

## 2022-11-14 NOTE — Assessment & Plan Note (Signed)
Follow cbc.  

## 2022-11-14 NOTE — Assessment & Plan Note (Signed)
Improved.  Follow.  Off abx currently.

## 2022-11-14 NOTE — Assessment & Plan Note (Signed)
Seeing Dr Posey Pronto.   On cimzia and plaquenil.  Overall has done well on this medication.  Follow. Held cimzia recently given infection. Continue f/u with rheumatology.

## 2022-11-14 NOTE — Assessment & Plan Note (Signed)
Continue gabapentin.

## 2022-11-14 NOTE — Assessment & Plan Note (Signed)
On metoprolol. Stable.

## 2022-11-14 NOTE — Assessment & Plan Note (Signed)
Continue wellbutrin.  Continue f/u with Dr Nicolasa Ducking.

## 2022-11-14 NOTE — Assessment & Plan Note (Signed)
Colonoscopy 06/2021 - recommended f/u in 3 years.

## 2022-11-30 DIAGNOSIS — F411 Generalized anxiety disorder: Secondary | ICD-10-CM | POA: Diagnosis not present

## 2022-12-07 DIAGNOSIS — F411 Generalized anxiety disorder: Secondary | ICD-10-CM | POA: Diagnosis not present

## 2022-12-09 DIAGNOSIS — M0609 Rheumatoid arthritis without rheumatoid factor, multiple sites: Secondary | ICD-10-CM | POA: Diagnosis not present

## 2022-12-13 ENCOUNTER — Ambulatory Visit (INDEPENDENT_AMBULATORY_CARE_PROVIDER_SITE_OTHER): Payer: 59

## 2022-12-13 ENCOUNTER — Other Ambulatory Visit: Payer: Self-pay

## 2022-12-13 DIAGNOSIS — E538 Deficiency of other specified B group vitamins: Secondary | ICD-10-CM

## 2022-12-13 MED ORDER — MELOXICAM 7.5 MG PO TABS
7.5000 mg | ORAL_TABLET | Freq: Two times a day (BID) | ORAL | 3 refills | Status: DC
  Filled 2022-12-13: qty 60, 30d supply, fill #0
  Filled 2023-01-11: qty 60, 30d supply, fill #1
  Filled 2023-02-21: qty 60, 30d supply, fill #2
  Filled 2023-03-08 – 2023-03-09 (×3): qty 60, 30d supply, fill #3

## 2022-12-13 MED ORDER — CYANOCOBALAMIN 1000 MCG/ML IJ SOLN
1000.0000 ug | Freq: Once | INTRAMUSCULAR | Status: AC
  Administered 2022-12-13: 1000 ug via INTRAMUSCULAR

## 2022-12-13 NOTE — Progress Notes (Addendum)
Patient presented for B 12 injection to left deltoid, patient voiced no concerns nor showed any signs of distress during injection. Suleima Ohlendorf,cma  

## 2022-12-14 DIAGNOSIS — F411 Generalized anxiety disorder: Secondary | ICD-10-CM | POA: Diagnosis not present

## 2022-12-22 ENCOUNTER — Other Ambulatory Visit: Payer: 59

## 2022-12-22 DIAGNOSIS — F4322 Adjustment disorder with anxiety: Secondary | ICD-10-CM | POA: Diagnosis not present

## 2022-12-22 DIAGNOSIS — F320 Major depressive disorder, single episode, mild: Secondary | ICD-10-CM | POA: Diagnosis not present

## 2022-12-24 ENCOUNTER — Other Ambulatory Visit: Payer: Self-pay

## 2022-12-24 DIAGNOSIS — F320 Major depressive disorder, single episode, mild: Secondary | ICD-10-CM | POA: Diagnosis not present

## 2022-12-24 MED ORDER — DULOXETINE HCL 30 MG PO CPEP
ORAL_CAPSULE | ORAL | 0 refills | Status: DC
  Filled 2022-12-24: qty 30, 15d supply, fill #0

## 2022-12-27 ENCOUNTER — Encounter: Payer: Self-pay | Admitting: Internal Medicine

## 2022-12-28 ENCOUNTER — Other Ambulatory Visit: Payer: Self-pay

## 2022-12-28 ENCOUNTER — Other Ambulatory Visit: Payer: Self-pay | Admitting: Internal Medicine

## 2022-12-28 DIAGNOSIS — F411 Generalized anxiety disorder: Secondary | ICD-10-CM | POA: Diagnosis not present

## 2022-12-28 MED FILL — Fluticasone Propionate Nasal Susp 50 MCG/ACT: NASAL | 30 days supply | Qty: 16 | Fill #2 | Status: AC

## 2022-12-30 ENCOUNTER — Other Ambulatory Visit: Payer: Self-pay

## 2022-12-30 DIAGNOSIS — F320 Major depressive disorder, single episode, mild: Secondary | ICD-10-CM | POA: Diagnosis not present

## 2022-12-30 MED ORDER — GABAPENTIN 300 MG PO CAPS
900.0000 mg | ORAL_CAPSULE | Freq: Every evening | ORAL | 1 refills | Status: DC
  Filled 2022-12-30: qty 90, 30d supply, fill #0
  Filled 2023-02-01: qty 90, 30d supply, fill #1

## 2022-12-31 ENCOUNTER — Other Ambulatory Visit: Payer: Self-pay

## 2022-12-31 DIAGNOSIS — Z79899 Other long term (current) drug therapy: Secondary | ICD-10-CM | POA: Diagnosis not present

## 2022-12-31 DIAGNOSIS — M0609 Rheumatoid arthritis without rheumatoid factor, multiple sites: Secondary | ICD-10-CM | POA: Diagnosis not present

## 2022-12-31 DIAGNOSIS — M159 Polyosteoarthritis, unspecified: Secondary | ICD-10-CM | POA: Diagnosis not present

## 2022-12-31 MED ORDER — HYDROXYCHLOROQUINE SULFATE 200 MG PO TABS
200.0000 mg | ORAL_TABLET | Freq: Two times a day (BID) | ORAL | 1 refills | Status: DC
  Filled 2022-12-31: qty 180, 90d supply, fill #0
  Filled 2023-04-07: qty 180, 90d supply, fill #1

## 2023-01-04 DIAGNOSIS — F411 Generalized anxiety disorder: Secondary | ICD-10-CM | POA: Diagnosis not present

## 2023-01-05 DIAGNOSIS — F320 Major depressive disorder, single episode, mild: Secondary | ICD-10-CM | POA: Diagnosis not present

## 2023-01-06 DIAGNOSIS — M0609 Rheumatoid arthritis without rheumatoid factor, multiple sites: Secondary | ICD-10-CM | POA: Diagnosis not present

## 2023-01-11 DIAGNOSIS — F411 Generalized anxiety disorder: Secondary | ICD-10-CM | POA: Diagnosis not present

## 2023-01-17 ENCOUNTER — Other Ambulatory Visit: Payer: Self-pay

## 2023-01-17 ENCOUNTER — Ambulatory Visit (INDEPENDENT_AMBULATORY_CARE_PROVIDER_SITE_OTHER): Payer: 59

## 2023-01-17 DIAGNOSIS — E538 Deficiency of other specified B group vitamins: Secondary | ICD-10-CM | POA: Diagnosis not present

## 2023-01-17 DIAGNOSIS — F5105 Insomnia due to other mental disorder: Secondary | ICD-10-CM | POA: Diagnosis not present

## 2023-01-17 DIAGNOSIS — F419 Anxiety disorder, unspecified: Secondary | ICD-10-CM | POA: Diagnosis not present

## 2023-01-17 DIAGNOSIS — F320 Major depressive disorder, single episode, mild: Secondary | ICD-10-CM | POA: Diagnosis not present

## 2023-01-17 MED ORDER — CYANOCOBALAMIN 1000 MCG/ML IJ SOLN
1000.0000 ug | Freq: Once | INTRAMUSCULAR | Status: AC
Start: 2023-01-17 — End: 2023-01-17
  Administered 2023-01-17: 1000 ug via INTRAMUSCULAR

## 2023-01-17 MED ORDER — BUPROPION HCL ER (SR) 150 MG PO TB12
ORAL_TABLET | ORAL | 0 refills | Status: DC
  Filled 2023-01-17: qty 180, 90d supply, fill #0

## 2023-01-17 NOTE — Progress Notes (Signed)
Patient arrived for a B12 injection and it was administered into her Right deltoid. Patient tolerated the injection well and did not show any signs of distress or voice any concerns.

## 2023-01-19 DIAGNOSIS — F320 Major depressive disorder, single episode, mild: Secondary | ICD-10-CM | POA: Diagnosis not present

## 2023-01-24 DIAGNOSIS — G8929 Other chronic pain: Secondary | ICD-10-CM | POA: Diagnosis not present

## 2023-01-24 DIAGNOSIS — M0609 Rheumatoid arthritis without rheumatoid factor, multiple sites: Secondary | ICD-10-CM | POA: Diagnosis not present

## 2023-01-24 DIAGNOSIS — M170 Bilateral primary osteoarthritis of knee: Secondary | ICD-10-CM | POA: Diagnosis not present

## 2023-01-24 DIAGNOSIS — M79644 Pain in right finger(s): Secondary | ICD-10-CM | POA: Diagnosis not present

## 2023-01-24 DIAGNOSIS — M1811 Unilateral primary osteoarthritis of first carpometacarpal joint, right hand: Secondary | ICD-10-CM | POA: Diagnosis not present

## 2023-01-25 DIAGNOSIS — F411 Generalized anxiety disorder: Secondary | ICD-10-CM | POA: Diagnosis not present

## 2023-01-25 DIAGNOSIS — F320 Major depressive disorder, single episode, mild: Secondary | ICD-10-CM | POA: Diagnosis not present

## 2023-02-01 ENCOUNTER — Other Ambulatory Visit: Payer: Self-pay

## 2023-02-01 ENCOUNTER — Other Ambulatory Visit: Payer: Self-pay | Admitting: Internal Medicine

## 2023-02-01 MED ORDER — FLUTICASONE PROPIONATE 50 MCG/ACT NA SUSP
2.0000 | Freq: Every day | NASAL | 2 refills | Status: DC
  Filled 2023-02-01: qty 16, 30d supply, fill #0
  Filled 2023-03-01: qty 16, 30d supply, fill #1
  Filled 2023-04-07: qty 16, 30d supply, fill #2

## 2023-02-03 DIAGNOSIS — M0609 Rheumatoid arthritis without rheumatoid factor, multiple sites: Secondary | ICD-10-CM | POA: Diagnosis not present

## 2023-02-04 ENCOUNTER — Other Ambulatory Visit: Payer: 59

## 2023-02-09 ENCOUNTER — Encounter: Payer: 59 | Admitting: Internal Medicine

## 2023-02-17 ENCOUNTER — Ambulatory Visit: Payer: 59

## 2023-02-18 ENCOUNTER — Other Ambulatory Visit (INDEPENDENT_AMBULATORY_CARE_PROVIDER_SITE_OTHER): Payer: 59

## 2023-02-18 ENCOUNTER — Encounter: Payer: Self-pay | Admitting: Internal Medicine

## 2023-02-18 ENCOUNTER — Other Ambulatory Visit: Payer: Self-pay

## 2023-02-18 DIAGNOSIS — F5105 Insomnia due to other mental disorder: Secondary | ICD-10-CM | POA: Diagnosis not present

## 2023-02-18 DIAGNOSIS — E7800 Pure hypercholesterolemia, unspecified: Secondary | ICD-10-CM | POA: Diagnosis not present

## 2023-02-18 DIAGNOSIS — F320 Major depressive disorder, single episode, mild: Secondary | ICD-10-CM | POA: Diagnosis not present

## 2023-02-18 DIAGNOSIS — F419 Anxiety disorder, unspecified: Secondary | ICD-10-CM | POA: Diagnosis not present

## 2023-02-18 LAB — LIPID PANEL
Cholesterol: 143 mg/dL (ref 0–200)
HDL: 48.3 mg/dL (ref >39.00)
LDL Cholesterol: 74 mg/dL (ref 0–99)
NonHDL: 94.92
Total CHOL/HDL Ratio: 3
Triglycerides: 105 mg/dL (ref 0.0–149.0)
VLDL: 21 mg/dL (ref 0.0–40.0)

## 2023-02-18 LAB — HEPATIC FUNCTION PANEL
ALT: 19 U/L (ref 0–35)
AST: 18 U/L (ref 0–37)
Albumin: 3.9 g/dL (ref 3.5–5.2)
Alkaline Phosphatase: 57 U/L (ref 39–117)
Bilirubin, Direct: 0 mg/dL (ref 0.0–0.3)
Total Bilirubin: 0.3 mg/dL (ref 0.2–1.2)
Total Protein: 6.1 g/dL (ref 6.0–8.3)

## 2023-02-18 LAB — BASIC METABOLIC PANEL
BUN: 17 mg/dL (ref 6–23)
CO2: 28 mEq/L (ref 19–32)
Calcium: 8.9 mg/dL (ref 8.4–10.5)
Chloride: 108 mEq/L (ref 96–112)
Creatinine, Ser: 0.95 mg/dL (ref 0.40–1.20)
GFR: 69.15 mL/min (ref >60.00)
Glucose, Bld: 87 mg/dL (ref 70–99)
Potassium: 3.9 mEq/L (ref 3.5–5.1)
Sodium: 144 mEq/L (ref 135–145)

## 2023-02-18 MED ORDER — BUPROPION HCL ER (SR) 150 MG PO TB12
150.0000 mg | ORAL_TABLET | Freq: Two times a day (BID) | ORAL | 0 refills | Status: DC
  Filled 2023-02-18 – 2023-05-08 (×2): qty 180, 90d supply, fill #0

## 2023-02-22 ENCOUNTER — Encounter: Payer: 59 | Admitting: Internal Medicine

## 2023-02-23 DIAGNOSIS — M170 Bilateral primary osteoarthritis of knee: Secondary | ICD-10-CM | POA: Diagnosis not present

## 2023-03-01 ENCOUNTER — Other Ambulatory Visit: Payer: Self-pay

## 2023-03-01 ENCOUNTER — Other Ambulatory Visit: Payer: Self-pay | Admitting: Internal Medicine

## 2023-03-02 DIAGNOSIS — M170 Bilateral primary osteoarthritis of knee: Secondary | ICD-10-CM | POA: Diagnosis not present

## 2023-03-03 ENCOUNTER — Other Ambulatory Visit: Payer: Self-pay

## 2023-03-03 DIAGNOSIS — M0609 Rheumatoid arthritis without rheumatoid factor, multiple sites: Secondary | ICD-10-CM | POA: Diagnosis not present

## 2023-03-03 MED FILL — Gabapentin Cap 300 MG: ORAL | 30 days supply | Qty: 90 | Fill #0 | Status: AC

## 2023-03-07 ENCOUNTER — Ambulatory Visit (INDEPENDENT_AMBULATORY_CARE_PROVIDER_SITE_OTHER): Payer: 59 | Admitting: Internal Medicine

## 2023-03-07 ENCOUNTER — Encounter: Payer: Self-pay | Admitting: Internal Medicine

## 2023-03-07 VITALS — BP 114/74 | HR 64 | Temp 99.0°F | Ht 62.0 in | Wt 203.8 lb

## 2023-03-07 DIAGNOSIS — N926 Irregular menstruation, unspecified: Secondary | ICD-10-CM

## 2023-03-07 DIAGNOSIS — M069 Rheumatoid arthritis, unspecified: Secondary | ICD-10-CM | POA: Diagnosis not present

## 2023-03-07 DIAGNOSIS — E538 Deficiency of other specified B group vitamins: Secondary | ICD-10-CM

## 2023-03-07 DIAGNOSIS — D649 Anemia, unspecified: Secondary | ICD-10-CM

## 2023-03-07 DIAGNOSIS — I471 Supraventricular tachycardia, unspecified: Secondary | ICD-10-CM | POA: Diagnosis not present

## 2023-03-07 DIAGNOSIS — F439 Reaction to severe stress, unspecified: Secondary | ICD-10-CM | POA: Diagnosis not present

## 2023-03-07 DIAGNOSIS — M25569 Pain in unspecified knee: Secondary | ICD-10-CM

## 2023-03-07 DIAGNOSIS — Z1211 Encounter for screening for malignant neoplasm of colon: Secondary | ICD-10-CM | POA: Diagnosis not present

## 2023-03-07 DIAGNOSIS — Z0000 Encounter for general adult medical examination without abnormal findings: Secondary | ICD-10-CM

## 2023-03-07 DIAGNOSIS — E7800 Pure hypercholesterolemia, unspecified: Secondary | ICD-10-CM

## 2023-03-07 MED ORDER — CYANOCOBALAMIN 1000 MCG/ML IJ SOLN
1000.0000 ug | Freq: Once | INTRAMUSCULAR | Status: AC
Start: 2023-03-07 — End: 2023-07-20
  Administered 2023-07-20: 1000 ug via INTRAMUSCULAR

## 2023-03-07 NOTE — Progress Notes (Signed)
Subjective:    [name], female    DOB: [birth year], 52 y.o.   MRN: [id number]  Patient here for  Chief Complaint  Patient presents with   Annual Exam    HPI Here for her physical exam. Seeing ortho for bilateal knee pain. Orthovisc injection 03/02/23 (second). Planning for third - this week. Seeing Dr Allena Katz 12/31/22 - follow up RA. Taking plaquenil and receiving cimzia infusions. Is able to be more active.  Overall doing better regarding increased stress.  Seeing psychiatry.  Has noticed (starting a few months ago) - varying temperature sensitivity, some facial hair.  LMP 12/31/22 - prior 11/16/22.  Not as heavy.  Breathing stable.  No increased cough or congestion.  No abdominal pain reported.     Past Medical History:  Diagnosis Date   Allergy    Anemia    Anxiety    Arthritis    Blood in stool    H/O   Complication of anesthesia    Depression    GERD (gastroesophageal reflux disease)    History of chicken pox    Hx: UTI (urinary tract infection)    Hyperlipidemia    PONV (postoperative nausea and vomiting)    Past Surgical History:  Procedure Laterality Date   AUGMENTATION MAMMAPLASTY  09/21/1999   CESAREAN SECTION  2007 and 2009   COLONOSCOPY WITH PROPOFOL N/A 06/19/2021   Procedure: COLONOSCOPY WITH PROPOFOL;  Surgeon: Pasty Spillers, MD;  Location: [facility] ENDOSCOPY;  Service: Endoscopy;  Laterality: N/A;   COSMETIC SURGERY     tummy tuck  09/21/1999   Family History  Problem Relation Age of Onset   Healthy Mother    Hypertension Mother    Miscarriages / [place] Mother    Cancer Father        non hogkins lymphoma   Atrial fibrillation Father    Heart disease Father    Hypertension Father    Depression Father    Arthritis Sister    Asthma Sister    Hypertension Sister    Diabetes Sister    Healthy Daughter    Healthy Son    Alzheimer's disease Maternal Grandmother    Healthy Sister    Arthritis Maternal Grandfather    Colon  cancer Paternal Uncle 92   Breast cancer Neg Hx    Social History   Socioeconomic History   Marital status: Married    Spouse name: Not on file   Number of children: Not on file   Years of education: Not on file   Highest education level: Not on file  Occupational History   Not on file  Tobacco Use   Smoking status: Never   Smokeless tobacco: Never  Vaping Use   Vaping Use: Never used  Substance and Sexual Activity   Alcohol use: Yes    Alcohol/week: 5.0 standard drinks of alcohol    Types: 5 Cans of beer per week    Comment: occasional   Drug use: No   Sexual activity: Yes    Birth control/protection: None    Comment: Husband had a vasectomy  Other Topics Concern   Not on file  Social History Narrative   Not on file   Social Determinants of Health   Financial Resource Strain: Not on file  Food Insecurity: Not on file  Transportation Needs: Not on file  Physical Activity: Not on file  Stress: Not on file  Social Connections: Not on file     Review of  Systems  Constitutional:  Negative for appetite change and unexpected weight change.  HENT:  Negative for congestion, sinus pressure and sore throat.   Eyes:  Negative for pain and visual disturbance.  Respiratory:  Negative for cough, chest tightness and shortness of breath.   Cardiovascular:  Negative for chest pain, palpitations and leg swelling.  Gastrointestinal:  Negative for abdominal pain, diarrhea, nausea and vomiting.  Genitourinary:  Negative for difficulty urinating and dysuria.  Musculoskeletal:  Negative for myalgias.       Knee - joint issues as outlined.    Skin:  Negative for color change and rash.  Neurological:  Negative for dizziness and headaches.  Hematological:  Negative for adenopathy. Does not bruise/bleed easily.  Psychiatric/Behavioral:  Negative for agitation and dysphoric mood.        Objective:     BP 114/74   Pulse 64   Temp 99 F (37.2 C) (Oral)   Ht 5\' 2"  (1.575 m)   Wt  203 lb 12.8 oz (92.4 kg)   SpO2 96%   BMI 37.28 kg/m  Wt Readings from Last 3 Encounters:  03/07/23 203 lb 12.8 oz (92.4 kg)  11/04/22 205 lb 3.2 oz (93.1 kg)  10/15/22 202 lb 12.8 oz (92 kg)    Physical Exam Vitals reviewed.  Constitutional:      General: She is not in acute distress.    Appearance: Normal appearance. She is well-developed.  HENT:     Head: Normocephalic and atraumatic.     Right Ear: External ear normal.     Left Ear: External ear normal.  Eyes:     General: No scleral icterus.       Right eye: No discharge.        Left eye: No discharge.     Conjunctiva/sclera: Conjunctivae normal.  Neck:     Thyroid: No thyromegaly.  Cardiovascular:     Rate and Rhythm: Normal rate and regular rhythm.  Pulmonary:     Effort: No tachypnea, accessory muscle usage or respiratory distress.     Breath sounds: Normal breath sounds. No decreased breath sounds or wheezing.  Chest:  Breasts:    Right: No inverted nipple, mass, nipple discharge or tenderness (no axillary adenopathy).     Left: No inverted nipple, mass, nipple discharge or tenderness (no axilarry adenopathy).  Abdominal:     General: Bowel sounds are normal.     Palpations: Abdomen is soft.     Tenderness: There is no abdominal tenderness.  Musculoskeletal:        General: No swelling or tenderness.     Cervical back: Neck supple.  Lymphadenopathy:     Cervical: No cervical adenopathy.  Skin:    Findings: No erythema or rash.  Neurological:     Mental Status: She is alert and oriented to person, place, and time.  Psychiatric:        Mood and Affect: Mood normal.        Behavior: Behavior normal.      Outpatient Encounter Medications as of 03/07/2023  Medication Sig   acetaminophen (TYLENOL) 500 MG tablet Take 500 mg by mouth every 6 (six) hours as needed.   buPROPion (WELLBUTRIN SR) 150 MG 12 hr tablet Take 1 tablet (150 mg total) by mouth 2 (two) times daily at 8am and 1pm.   Certolizumab Pegol 2 X  200 MG/ML PSKT Inject into the skin every 30 (thirty) days.   cetirizine (ZYRTEC) 10 MG tablet Take 1 tablet (10  mg total) by mouth daily.   famotidine (PEPCID) 20 MG tablet Take 20 mg by mouth 2 (two) times daily.   ferrous sulfate 325 (65 FE) MG tablet Take 1 tablet (325 mg total) by mouth daily with breakfast.   fluticasone (FLONASE) 50 MCG/ACT nasal spray Place 2 sprays into both nostrils daily.   gabapentin (NEURONTIN) 300 MG capsule Take 3 capsules (900 mg total) by mouth every evening.   hydroxychloroquine (PLAQUENIL) 200 MG tablet Take 1 tablet (200 mg total) by mouth 2 (two) times daily for inflammatory arthritis   Melatonin 10 MG TABS Take 10 mg by mouth at bedtime.   meloxicam (MOBIC) 7.5 MG tablet Take 1 tablet (7.5 mg total) by mouth 2 (two) times daily with a meal.   metoprolol succinate (TOPROL XL) 25 MG 24 hr tablet Take 1 tablet (25 mg total) by mouth daily.   rosuvastatin (CRESTOR) 10 MG tablet Take 1 tablet (10 mg total) by mouth daily.   [DISCONTINUED] ALPRAZolam (XANAX) 0.25 MG tablet TAKE 1 TABLET BY MOUTH ONCE DAILY AS NEEDED FOR ANXIETY   [DISCONTINUED] buPROPion (WELLBUTRIN SR) 150 MG 12 hr tablet Take 1 tablet by mouth twice daily at 8:00am and 1:00pm   [DISCONTINUED] buPROPion (WELLBUTRIN SR) 150 MG 12 hr tablet Take 1 twice daily at 8am and 1pm (Patient not taking: Reported on 03/07/2023)   [DISCONTINUED] DULoxetine (CYMBALTA) 30 MG capsule Take 1 capsule (30 mg total) by mouth daily for 5 days, THEN 2 capsules (60 mg total) daily with breakfast.   Facility-Administered Encounter Medications as of 03/07/2023  Medication   cyanocobalamin (VITAMIN B12) injection 1,000 mcg     Lab Results  Component Value Date   WBC 6.5 10/29/2022   HGB 13.2 10/29/2022   HCT 39.4 10/29/2022   PLT 271.0 10/29/2022   GLUCOSE 87 02/18/2023   CHOL 143 02/18/2023   TRIG 105.0 02/18/2023   HDL 48.30 02/18/2023   LDLDIRECT 87.0 04/03/2019   LDLCALC 74 02/18/2023   ALT 19 02/18/2023    AST 18 02/18/2023   NA 144 02/18/2023   K 3.9 02/18/2023   CL 108 02/18/2023   CREATININE 0.95 02/18/2023   BUN 17 02/18/2023   CO2 28 02/18/2023   TSH 2.23 10/29/2022    MM 3D SCREEN BREAST W/IMPLANT BILATERAL  Result Date: 10/28/2022 CLINICAL DATA:  Screening. EXAM: DIGITAL SCREENING BILATERAL MAMMOGRAM WITH IMPLANTS, CAD AND TOMOSYNTHESIS TECHNIQUE: Bilateral screening digital craniocaudal and mediolateral oblique mammograms were obtained. Bilateral screening digital breast tomosynthesis was performed. The images were evaluated with computer-aided detection. Standard and/or implant displaced views were performed. COMPARISON:  Previous exam(s). ACR Breast Density Category b: There are scattered areas of fibroglandular density. FINDINGS: The patient has retroglandular implants. There are no findings suspicious for malignancy. IMPRESSION: No mammographic evidence of malignancy. A result letter of this screening mammogram will be mailed directly to the patient. RECOMMENDATION: Screening mammogram in one year. (Code:SM-B-01Y) BI-RADS CATEGORY  1: Negative. Electronically Signed   By: Ted Mcalpine M.D.   On: 10/28/2022 14:01       Assessment & Plan:  Routine general medical examination at a health care facility  Healthcare maintenance Assessment & Plan: Physical today 03/07/23.  PAP 01/29/22 - negative HPV.  Benign reactive/repair changes. Mammogram 10/27/2022 - Birads I. Colonoscopy 06/23/21 - recommended f/u colonoscopy in 3 years.    Hypercholesterolemia Assessment & Plan: Continue crestor.  Low cholesterol diet and exercise.  Follow lipid panel and liver function tests.   Lab Results  Component  Value Date   CHOL 143 02/18/2023   HDL 48.30 02/18/2023   LDLCALC 74 02/18/2023   LDLDIRECT 87.0 04/03/2019   TRIG 105.0 02/18/2023   CHOLHDL 3 02/18/2023     Orders: -     Lipid panel; Future -     Hepatic function panel; Future -     Basic metabolic panel; Future  B12  deficiency -     Cyanocobalamin  Anemia, unspecified type Assessment & Plan: Follow cbc.    Colon cancer screening Assessment & Plan: Colonoscopy 06/19/21 - f/u colonoscopy in 3 years    Knee pain, unspecified chronicity, unspecified laterality Assessment & Plan: Seeing ortho for bilateal knee pain. Orthovisc injection 03/02/23 (second). Planning for third - this week.    Menstrual irregularity Assessment & Plan: Periods as outlined.  Temperature sensitivity.  Keep menstrual diary.  Follow.  Discussed treatment - effexor, etc. Seeing psychiatry.     Paroxysmal SVT (supraventricular tachycardia) Assessment & Plan: On metoprolol. Stable.    Rheumatoid arthritis, involving unspecified site, unspecified whether rheumatoid factor present [facility]) Assessment & Plan: Seeing Dr Allena Katz.   On cimzia and plaquenil.  Overall has done well on this medication.  Continue f/u with rheumatology.    Stress Assessment & Plan: Continue wellbutrin.  Continue f/u with Dr Maryruth Bun. Overall doing better.       Dale Ferron, MD

## 2023-03-07 NOTE — Assessment & Plan Note (Signed)
Physical today 03/07/23.  PAP 01/29/22 - negative HPV.  Benign reactive/repair changes. Mammogram 10/27/2022 - Birads I. Colonoscopy 06/23/21 - recommended f/u colonoscopy in 3 years.

## 2023-03-08 ENCOUNTER — Other Ambulatory Visit: Payer: Self-pay

## 2023-03-08 ENCOUNTER — Other Ambulatory Visit: Payer: Self-pay | Admitting: Internal Medicine

## 2023-03-08 NOTE — Telephone Encounter (Signed)
She is seeing Dr Maryruth Bun (her psychiatrist) now.  If needs something for anxiety - needs to request from her.

## 2023-03-09 ENCOUNTER — Other Ambulatory Visit: Payer: Self-pay

## 2023-03-09 ENCOUNTER — Other Ambulatory Visit: Payer: Self-pay | Admitting: Internal Medicine

## 2023-03-09 DIAGNOSIS — M170 Bilateral primary osteoarthritis of knee: Secondary | ICD-10-CM | POA: Diagnosis not present

## 2023-03-09 NOTE — Telephone Encounter (Signed)
Refilled: 07/21/2022 Last OV: 03/07/2023 Next OV: 07/20/2023

## 2023-03-10 ENCOUNTER — Other Ambulatory Visit: Payer: Self-pay

## 2023-03-10 MED ORDER — ALPRAZOLAM 0.25 MG PO TABS
0.2500 mg | ORAL_TABLET | Freq: Every day | ORAL | 0 refills | Status: DC | PRN
  Filled 2023-03-10: qty 30, 30d supply, fill #0

## 2023-03-12 ENCOUNTER — Encounter: Payer: Self-pay | Admitting: Internal Medicine

## 2023-03-12 NOTE — Assessment & Plan Note (Signed)
Continue crestor.  Low cholesterol diet and exercise.  Follow lipid panel and liver function tests.   Lab Results  Component Value Date   CHOL 143 02/18/2023   HDL 48.30 02/18/2023   LDLCALC 74 02/18/2023   LDLDIRECT 87.0 04/03/2019   TRIG 105.0 02/18/2023   CHOLHDL 3 02/18/2023

## 2023-03-12 NOTE — Assessment & Plan Note (Signed)
Continue wellbutrin.  Continue f/u with Dr Maryruth Bun. Overall doing better.

## 2023-03-12 NOTE — Assessment & Plan Note (Signed)
Follow cbc.  

## 2023-03-12 NOTE — Assessment & Plan Note (Signed)
Periods as outlined.  Temperature sensitivity.  Keep menstrual diary.  Follow.  Discussed treatment - effexor, etc. Seeing psychiatry.

## 2023-03-12 NOTE — Assessment & Plan Note (Signed)
Seeing ortho for bilateal knee pain. Orthovisc injection 03/02/23 (second). Planning for third - this week.

## 2023-03-12 NOTE — Assessment & Plan Note (Signed)
On metoprolol. Stable.  

## 2023-03-12 NOTE — Assessment & Plan Note (Signed)
Colonoscopy 06/19/21 - f/u colonoscopy in 3 years  ?

## 2023-03-12 NOTE — Assessment & Plan Note (Signed)
Seeing Dr Allena Katz.   On cimzia and plaquenil.  Overall has done well on this medication.  Continue f/u with rheumatology.

## 2023-03-30 MED FILL — Gabapentin Cap 300 MG: ORAL | 30 days supply | Qty: 90 | Fill #1 | Status: AC

## 2023-04-07 ENCOUNTER — Other Ambulatory Visit: Payer: Self-pay

## 2023-04-07 ENCOUNTER — Ambulatory Visit: Payer: 59

## 2023-04-07 DIAGNOSIS — M0609 Rheumatoid arthritis without rheumatoid factor, multiple sites: Secondary | ICD-10-CM | POA: Diagnosis not present

## 2023-04-07 MED ORDER — MELOXICAM 7.5 MG PO TABS
7.5000 mg | ORAL_TABLET | Freq: Two times a day (BID) | ORAL | 3 refills | Status: DC
  Filled 2023-04-07: qty 60, 30d supply, fill #0
  Filled 2023-05-18: qty 60, 30d supply, fill #1
  Filled 2023-06-09: qty 60, 30d supply, fill #2
  Filled 2023-07-14: qty 60, 30d supply, fill #3

## 2023-04-08 ENCOUNTER — Ambulatory Visit (INDEPENDENT_AMBULATORY_CARE_PROVIDER_SITE_OTHER): Payer: 59

## 2023-04-08 DIAGNOSIS — E538 Deficiency of other specified B group vitamins: Secondary | ICD-10-CM | POA: Diagnosis not present

## 2023-04-08 MED ORDER — CYANOCOBALAMIN 1000 MCG/ML IJ SOLN
1000.0000 ug | Freq: Once | INTRAMUSCULAR | Status: AC
Start: 2023-04-08 — End: 2023-04-08
  Administered 2023-04-08: 1000 ug via INTRAMUSCULAR

## 2023-04-08 NOTE — Progress Notes (Signed)
Patient presented for B 12 injection to left deltoid, patient voiced no concerns nor showed any signs of distress during injection. 

## 2023-04-15 ENCOUNTER — Other Ambulatory Visit: Payer: Self-pay

## 2023-04-15 MED ORDER — METOPROLOL SUCCINATE ER 25 MG PO TB24
25.0000 mg | ORAL_TABLET | Freq: Every day | ORAL | 0 refills | Status: DC
  Filled 2023-04-15: qty 30, 30d supply, fill #0

## 2023-04-15 NOTE — Telephone Encounter (Signed)
Requested Prescriptions   Signed Prescriptions Disp Refills   metoprolol succinate (TOPROL XL) 25 MG 24 hr tablet 30 tablet 0    Sig: Take 1 tablet (25 mg total) by mouth daily. Due for yearly follow up.  PLEASE CALL OFFICE TO SCHEDULE APPOINTMENT PRIOR TO NEXT REFILL (1st attempt)    Authorizing Provider: Debbe Odea    Ordering User: Guerry Minors

## 2023-04-15 NOTE — Telephone Encounter (Signed)
Please contact patient to schedule 12 month f/u.

## 2023-04-19 NOTE — Telephone Encounter (Signed)
Scheduled 09/11

## 2023-04-20 ENCOUNTER — Encounter (INDEPENDENT_AMBULATORY_CARE_PROVIDER_SITE_OTHER): Payer: Self-pay

## 2023-04-30 ENCOUNTER — Other Ambulatory Visit: Payer: Self-pay | Admitting: Internal Medicine

## 2023-05-01 ENCOUNTER — Other Ambulatory Visit: Payer: Self-pay | Admitting: Internal Medicine

## 2023-05-01 ENCOUNTER — Other Ambulatory Visit: Payer: Self-pay

## 2023-05-02 MED FILL — Gabapentin Cap 300 MG: ORAL | 30 days supply | Qty: 90 | Fill #0 | Status: AC

## 2023-05-03 ENCOUNTER — Other Ambulatory Visit: Payer: Self-pay

## 2023-05-06 DIAGNOSIS — M159 Polyosteoarthritis, unspecified: Secondary | ICD-10-CM | POA: Diagnosis not present

## 2023-05-06 DIAGNOSIS — M0609 Rheumatoid arthritis without rheumatoid factor, multiple sites: Secondary | ICD-10-CM | POA: Diagnosis not present

## 2023-05-06 DIAGNOSIS — Z7960 Long term (current) use of unspecified immunomodulators and immunosuppressants: Secondary | ICD-10-CM | POA: Diagnosis not present

## 2023-05-09 ENCOUNTER — Other Ambulatory Visit: Payer: Self-pay

## 2023-05-12 ENCOUNTER — Ambulatory Visit: Payer: 59

## 2023-05-17 ENCOUNTER — Ambulatory Visit (INDEPENDENT_AMBULATORY_CARE_PROVIDER_SITE_OTHER): Payer: 59

## 2023-05-17 DIAGNOSIS — E538 Deficiency of other specified B group vitamins: Secondary | ICD-10-CM | POA: Diagnosis not present

## 2023-05-17 DIAGNOSIS — Z23 Encounter for immunization: Secondary | ICD-10-CM | POA: Diagnosis not present

## 2023-05-17 MED ORDER — CYANOCOBALAMIN 1000 MCG/ML IJ SOLN
1000.0000 ug | Freq: Once | INTRAMUSCULAR | Status: AC
Start: 2023-05-17 — End: 2023-05-17
  Administered 2023-05-17: 1000 ug via INTRAMUSCULAR

## 2023-05-17 NOTE — Progress Notes (Signed)
Patient presented for B 12 injection to right deltoid, patient voiced no concerns nor showed any signs of distress during injection. 

## 2023-05-18 ENCOUNTER — Other Ambulatory Visit: Payer: Self-pay

## 2023-05-18 ENCOUNTER — Other Ambulatory Visit: Payer: Self-pay | Admitting: Internal Medicine

## 2023-05-18 MED ORDER — FLUTICASONE PROPIONATE 50 MCG/ACT NA SUSP
2.0000 | Freq: Every day | NASAL | 0 refills | Status: DC
  Filled 2023-05-18: qty 16, 30d supply, fill #0

## 2023-05-19 ENCOUNTER — Other Ambulatory Visit: Payer: Self-pay

## 2023-05-19 MED ORDER — METOPROLOL SUCCINATE ER 25 MG PO TB24
25.0000 mg | ORAL_TABLET | Freq: Every day | ORAL | 0 refills | Status: DC
  Filled 2023-05-19: qty 90, 90d supply, fill #0

## 2023-05-23 MED FILL — Gabapentin Cap 300 MG: ORAL | 30 days supply | Qty: 90 | Fill #1 | Status: CN

## 2023-05-24 ENCOUNTER — Other Ambulatory Visit: Payer: Self-pay

## 2023-05-24 DIAGNOSIS — F411 Generalized anxiety disorder: Secondary | ICD-10-CM | POA: Diagnosis not present

## 2023-05-31 DIAGNOSIS — F411 Generalized anxiety disorder: Secondary | ICD-10-CM | POA: Diagnosis not present

## 2023-06-01 ENCOUNTER — Other Ambulatory Visit: Payer: Self-pay

## 2023-06-01 ENCOUNTER — Encounter: Payer: Self-pay | Admitting: Cardiology

## 2023-06-01 ENCOUNTER — Ambulatory Visit: Payer: 59 | Attending: Cardiology | Admitting: Cardiology

## 2023-06-01 ENCOUNTER — Encounter: Payer: Self-pay | Admitting: Internal Medicine

## 2023-06-01 VITALS — BP 112/76 | HR 61 | Ht 62.0 in | Wt 207.6 lb

## 2023-06-01 DIAGNOSIS — I471 Supraventricular tachycardia, unspecified: Secondary | ICD-10-CM | POA: Diagnosis not present

## 2023-06-01 DIAGNOSIS — E782 Mixed hyperlipidemia: Secondary | ICD-10-CM

## 2023-06-01 MED ORDER — METOPROLOL SUCCINATE ER 25 MG PO TB24
25.0000 mg | ORAL_TABLET | Freq: Every day | ORAL | 3 refills | Status: DC
  Filled 2023-06-01 – 2023-08-05 (×2): qty 90, 90d supply, fill #0
  Filled 2023-11-15: qty 90, 90d supply, fill #1
  Filled 2024-02-09 – 2024-02-21 (×2): qty 90, 90d supply, fill #2
  Filled 2024-05-02 – 2024-05-15 (×4): qty 90, 90d supply, fill #3
  Filled ????-??-??: fill #3

## 2023-06-01 MED FILL — Gabapentin Cap 300 MG: ORAL | 30 days supply | Qty: 90 | Fill #1 | Status: AC

## 2023-06-01 NOTE — Patient Instructions (Signed)
Medication Instructions:   Your physician recommends that you continue on your current medications as directed. Please refer to the Current Medication list given to you today.  *If you need a refill on your cardiac medications before your next appointment, please call your pharmacy*   Lab Work:  None Ordered  If you have labs (blood work) drawn today and your tests are completely normal, you will receive your results only by: MyChart Message (if you have MyChart) OR A paper copy in the mail If you have any lab test that is abnormal or we need to change your treatment, we will call you to review the results.   Testing/Procedures:  None Ordered    Follow-Up: At [facility], you and your health needs are our priority.  As part of our continuing mission to provide you with exceptional heart care, we have created designated Provider Care Teams.  These Care Teams include your primary Cardiologist (physician) and Advanced Practice Providers (APPs -  Physician Assistants and Nurse Practitioners) who all work together to provide you with the care you need, when you need it.  We recommend signing up for the patient portal called "MyChart".  Sign up information is provided on this After Visit Summary.  MyChart is used to connect with patients for Virtual Visits (Telemedicine).  Patients are able to view lab/test results, encounter notes, upcoming appointments, etc.  Non-urgent messages can be sent to your provider as well.   To learn more about what you can do with MyChart, go to [email or web address].    Your next appointment:   18  month(s)  Provider:   You may see Debbe Odea, MD or one of the following Advanced Practice Providers on your designated Care Team:   Nicolasa Ducking, NP Eula Listen, PA-C Cadence Fransico Michael, PA-C Charlsie Quest, NP

## 2023-06-01 NOTE — Progress Notes (Signed)
Cardiology Office Note:    Date:  06/01/2023   ID:  Tamara Shah, DOB 25-Mar-1971, MRN [id number]  PCP:  Dale Waverly, MD   [facility] Providers Cardiologist:  Debbe Odea, MD     Referring MD: Dale [place], MD   Chief Complaint  Patient presents with   Follow-up    Yearly follow up.  Patient denies new or acute cardiac problems/concerns today.      History of Present Illness:    Tamara Shah is a 52 y.o. female with a hx of hyperlipidemia, paroxysmal SVT, rheumatoid arthritis, who presents for follow-up.    Was started on Toprol-XL 25 mg daily due to palpitations and paroxysmal SVT.  Symptoms have been well-controlled since.  Tolerating medication with no adverse effects.  Feels well, no concerns at this time.  Prior notes. Cardiac monitor 02/2022 occasional paroxysmal SVT, rare PVCs.  Past Medical History:  Diagnosis Date   Allergy    Anemia    Anxiety    Arthritis    Blood in stool    H/O   Complication of anesthesia    Depression    GERD (gastroesophageal reflux disease)    History of chicken pox    Hx: UTI (urinary tract infection)    Hyperlipidemia    PONV (postoperative nausea and vomiting)     Past Surgical History:  Procedure Laterality Date   AUGMENTATION MAMMAPLASTY  09/21/1999   CESAREAN SECTION  2007 and 2009   COLONOSCOPY WITH PROPOFOL N/A 06/19/2021   Procedure: COLONOSCOPY WITH PROPOFOL;  Surgeon: Pasty Spillers, MD;  Location: [facility] ENDOSCOPY;  Service: Endoscopy;  Laterality: N/A;   COSMETIC SURGERY     tummy tuck  09/21/1999    Current Medications: Current Meds  Medication Sig   acetaminophen (TYLENOL) 500 MG tablet Take 500 mg by mouth every 6 (six) hours as needed.   ALPRAZolam (XANAX) 0.25 MG tablet Take 1 tablet (0.25 mg total) by mouth daily as needed for anxiety.   buPROPion (WELLBUTRIN SR) 150 MG 12 hr tablet Take 1 tablet (150 mg total) by mouth 2 (two) times daily at 8am and 1pm.   Certolizumab Pegol 2 X  200 MG/ML PSKT Inject into the skin every 30 (thirty) days.   cetirizine (ZYRTEC) 10 MG tablet Take 1 tablet (10 mg total) by mouth daily.   famotidine (PEPCID) 20 MG tablet Take 20 mg by mouth 2 (two) times daily.   ferrous sulfate 325 (65 FE) MG tablet Take 1 tablet (325 mg total) by mouth daily with breakfast.   fluticasone (FLONASE) 50 MCG/ACT nasal spray Place 2 sprays into both nostrils daily.   gabapentin (NEURONTIN) 300 MG capsule Take 3 capsules (900 mg total) by mouth every evening.   hydroxychloroquine (PLAQUENIL) 200 MG tablet Take 1 tablet (200 mg total) by mouth 2 (two) times daily for inflammatory arthritis   Melatonin 10 MG TABS Take 10 mg by mouth at bedtime.   meloxicam (MOBIC) 7.5 MG tablet Take 1 tablet (7.5 mg total) by mouth 2 (two) times daily.   rosuvastatin (CRESTOR) 10 MG tablet Take 1 tablet (10 mg total) by mouth daily.   [DISCONTINUED] metoprolol succinate (TOPROL XL) 25 MG 24 hr tablet Take 1 tablet (25 mg total) by mouth daily.   Current Facility-Administered Medications for the 06/01/23 encounter (Office Visit) with Debbe Odea, MD  Medication   cyanocobalamin (VITAMIN B12) injection 1,000 mcg     Allergies:   Patient has no known allergies.   Social  History   Socioeconomic History   Marital status: Married    Spouse name: Not on file   Number of children: Not on file   Years of education: Not on file   Highest education level: Not on file  Occupational History   Not on file  Tobacco Use   Smoking status: Never   Smokeless tobacco: Never  Vaping Use   Vaping status: Never Used  Substance and Sexual Activity   Alcohol use: Yes    Alcohol/week: 5.0 standard drinks of alcohol    Types: 5 Cans of beer per week    Comment: occasional   Drug use: No   Sexual activity: Yes    Birth control/protection: None    Comment: Husband had a vasectomy  Other Topics Concern   Not on file  Social History Narrative   Not on file   Social Determinants  of Health   Financial Resource Strain: Not on file  Food Insecurity: Not on file  Transportation Needs: Not on file  Physical Activity: Not on file  Stress: Not on file  Social Connections: Not on file     Family History: The patient's family history includes Alzheimer's disease in her maternal grandmother; Arthritis in her maternal grandfather and sister; Asthma in her sister; Atrial fibrillation in her father; Cancer in her father; Colon cancer (age of onset: 24) in her paternal uncle; Depression in her father; Diabetes in her sister; Healthy in her daughter, mother, sister, and son; Heart disease in her father; Hypertension in her father, mother, and sister; Miscarriages / [place] in her mother. There is no history of Breast cancer.  ROS:   Please see the history of present illness.     All other systems reviewed and are negative.  EKGs/Labs/Other Studies Reviewed:    The following studies were reviewed today:   EKG Interpretation Date/Time:  Wednesday June 01 2023 11:51:13 EDT Ventricular Rate:  61 PR Interval:  160 QRS Duration:  92 QT Interval:  410 QTC Calculation: 412 R Axis:   14  Text Interpretation: Normal sinus rhythm Low voltage QRS Confirmed by Debbe Odea ([id number]) on 06/01/2023 11:58:19 AM    Recent Labs: 10/29/2022: Hemoglobin 13.2; Platelets 271.0; TSH 2.23 02/18/2023: ALT 19; BUN 17; Creatinine, Ser 0.95; Potassium 3.9; Sodium 144  Recent Lipid Panel    Component Value Date/Time   CHOL 143 02/18/2023 0759   TRIG 105.0 02/18/2023 0759   HDL 48.30 02/18/2023 0759   CHOLHDL 3 02/18/2023 0759   VLDL 21.0 02/18/2023 0759   LDLCALC 74 02/18/2023 0759   LDLDIRECT 87.0 04/03/2019 1051     Risk Assessment/Calculations:         Physical Exam:    VS:  BP 112/76 (BP Location: Left Arm, Patient Position: Sitting, Cuff Size: Large)   Pulse 61   Ht 5\' 2"  (1.575 m)   Wt 207 lb 9.6 oz (94.2 kg)   SpO2 98%   BMI 37.97 kg/m     Wt Readings  from Last 3 Encounters:  06/01/23 207 lb 9.6 oz (94.2 kg)  03/07/23 203 lb 12.8 oz (92.4 kg)  11/04/22 205 lb 3.2 oz (93.1 kg)     GEN:  Well nourished, well developed in no acute distress HEENT: Normal CARDIAC: RRR, no murmurs, rubs, gallops RESPIRATORY:  Clear to auscultation without rales, wheezing or rhonchi  ABDOMEN: Soft, non-tender, non-distended MUSCULOSKELETAL:  No edema; No deformity  SKIN: Warm and dry NEUROLOGIC:  Alert and oriented x 3 PSYCHIATRIC:  Normal  affect   ASSESSMENT:    1. Paroxysmal SVT (supraventricular tachycardia)   2. Mixed hyperlipidemia    PLAN:    In order of problems listed above:  Palpitations, paroxysmal SVT, rare PACs.  Symptoms controlled with beta-blocker.  Continue Toprol-XL 25 mg daily. Hyperlipidemia, less well-controlled continue Crestor 10 mg daily.  Follow-up 12-18 months     Medication Adjustments/Labs and Tests Ordered: Current medicines are reviewed at length with the patient today.  Concerns regarding medicines are outlined above.  Orders Placed This Encounter  Procedures   EKG 12-Lead   Meds ordered this encounter  Medications   metoprolol succinate (TOPROL XL) 25 MG 24 hr tablet    Sig: Take 1 tablet (25 mg total) by mouth daily.    Dispense:  90 tablet    Refill:  3    Patient Instructions  Medication Instructions:   Your physician recommends that you continue on your current medications as directed. Please refer to the Current Medication list given to you today.  *If you need a refill on your cardiac medications before your next appointment, please call your pharmacy*   Lab Work:  None Ordered  If you have labs (blood work) drawn today and your tests are completely normal, you will receive your results only by: MyChart Message (if you have MyChart) OR A paper copy in the mail If you have any lab test that is abnormal or we need to change your treatment, we will call you to review the  results.   Testing/Procedures:  None Ordered   Follow-Up: At [facility], you and your health needs are our priority.  As part of our continuing mission to provide you with exceptional heart care, we have created designated Provider Care Teams.  These Care Teams include your primary Cardiologist (physician) and Advanced Practice Providers (APPs -  Physician Assistants and Nurse Practitioners) who all work together to provide you with the care you need, when you need it.  We recommend signing up for the patient portal called "MyChart".  Sign up information is provided on this After Visit Summary.  MyChart is used to connect with patients for Virtual Visits (Telemedicine).  Patients are able to view lab/test results, encounter notes, upcoming appointments, etc.  Non-urgent messages can be sent to your provider as well.   To learn more about what you can do with MyChart, go to [email or web address].    Your next appointment:   18  month(s)  Provider:   You may see Debbe Odea, MD or one of the following Advanced Practice Providers on your designated Care Team:   Nicolasa Ducking, NP Eula Listen, PA-C Cadence Fransico Michael, PA-C Charlsie Quest, NP   Signed, Debbe Odea, MD  06/01/2023 12:19 PM    [facility]

## 2023-06-07 DIAGNOSIS — F411 Generalized anxiety disorder: Secondary | ICD-10-CM | POA: Diagnosis not present

## 2023-06-09 ENCOUNTER — Other Ambulatory Visit: Payer: Self-pay

## 2023-06-09 DIAGNOSIS — M0609 Rheumatoid arthritis without rheumatoid factor, multiple sites: Secondary | ICD-10-CM | POA: Diagnosis not present

## 2023-06-14 ENCOUNTER — Other Ambulatory Visit: Payer: Self-pay

## 2023-06-14 DIAGNOSIS — F5105 Insomnia due to other mental disorder: Secondary | ICD-10-CM | POA: Diagnosis not present

## 2023-06-14 DIAGNOSIS — F411 Generalized anxiety disorder: Secondary | ICD-10-CM | POA: Diagnosis not present

## 2023-06-14 DIAGNOSIS — F419 Anxiety disorder, unspecified: Secondary | ICD-10-CM | POA: Diagnosis not present

## 2023-06-14 DIAGNOSIS — F320 Major depressive disorder, single episode, mild: Secondary | ICD-10-CM | POA: Diagnosis not present

## 2023-06-14 MED ORDER — BUPROPION HCL ER (SR) 150 MG PO TB12
150.0000 mg | ORAL_TABLET | Freq: Two times a day (BID) | ORAL | 0 refills | Status: DC
  Filled 2023-06-14 – 2023-08-05 (×2): qty 180, 90d supply, fill #0

## 2023-06-20 ENCOUNTER — Ambulatory Visit (INDEPENDENT_AMBULATORY_CARE_PROVIDER_SITE_OTHER): Payer: 59

## 2023-06-20 DIAGNOSIS — E538 Deficiency of other specified B group vitamins: Secondary | ICD-10-CM

## 2023-06-20 MED ORDER — CYANOCOBALAMIN 1000 MCG/ML IJ SOLN
1000.0000 ug | Freq: Once | INTRAMUSCULAR | Status: AC
Start: 2023-06-20 — End: 2023-06-20
  Administered 2023-06-20: 1000 ug via INTRAMUSCULAR

## 2023-06-20 NOTE — Progress Notes (Cosign Needed)
Pt presented for their vitamin B12 injection. Pt was identified through two identifiers. Pt tolerated shot well in their left  deltoid.  

## 2023-06-21 DIAGNOSIS — F411 Generalized anxiety disorder: Secondary | ICD-10-CM | POA: Diagnosis not present

## 2023-06-27 ENCOUNTER — Other Ambulatory Visit: Payer: Self-pay | Admitting: Internal Medicine

## 2023-06-27 ENCOUNTER — Other Ambulatory Visit: Payer: Self-pay

## 2023-06-27 MED ORDER — FLUTICASONE PROPIONATE 50 MCG/ACT NA SUSP
2.0000 | Freq: Every day | NASAL | 3 refills | Status: DC
  Filled 2023-06-27: qty 16, 30d supply, fill #0
  Filled 2023-08-05: qty 16, 30d supply, fill #1
  Filled 2023-09-14: qty 16, 30d supply, fill #2
  Filled 2023-10-24: qty 16, 30d supply, fill #3

## 2023-06-27 MED ORDER — GABAPENTIN 300 MG PO CAPS
900.0000 mg | ORAL_CAPSULE | Freq: Every evening | ORAL | 1 refills | Status: DC
  Filled 2023-06-27 – 2023-06-29 (×2): qty 90, 30d supply, fill #0
  Filled 2023-07-25: qty 90, 30d supply, fill #1

## 2023-06-28 DIAGNOSIS — F411 Generalized anxiety disorder: Secondary | ICD-10-CM | POA: Diagnosis not present

## 2023-06-30 ENCOUNTER — Other Ambulatory Visit: Payer: Self-pay

## 2023-07-12 DIAGNOSIS — F411 Generalized anxiety disorder: Secondary | ICD-10-CM | POA: Diagnosis not present

## 2023-07-14 ENCOUNTER — Other Ambulatory Visit: Payer: Self-pay

## 2023-07-14 DIAGNOSIS — M0609 Rheumatoid arthritis without rheumatoid factor, multiple sites: Secondary | ICD-10-CM | POA: Diagnosis not present

## 2023-07-15 ENCOUNTER — Other Ambulatory Visit (INDEPENDENT_AMBULATORY_CARE_PROVIDER_SITE_OTHER): Payer: 59

## 2023-07-15 ENCOUNTER — Other Ambulatory Visit: Payer: Self-pay

## 2023-07-15 DIAGNOSIS — E7800 Pure hypercholesterolemia, unspecified: Secondary | ICD-10-CM

## 2023-07-15 LAB — HEPATIC FUNCTION PANEL
ALT: 21 U/L (ref 0–35)
AST: 20 U/L (ref 0–37)
Albumin: 4 g/dL (ref 3.5–5.2)
Alkaline Phosphatase: 68 U/L (ref 39–117)
Bilirubin, Direct: 0.1 mg/dL (ref 0.0–0.3)
Total Bilirubin: 0.5 mg/dL (ref 0.2–1.2)
Total Protein: 6 g/dL (ref 6.0–8.3)

## 2023-07-15 LAB — BASIC METABOLIC PANEL
BUN: 14 mg/dL (ref 6–23)
CO2: 29 meq/L (ref 19–32)
Calcium: 8.7 mg/dL (ref 8.4–10.5)
Chloride: 106 meq/L (ref 96–112)
Creatinine, Ser: 0.81 mg/dL (ref 0.40–1.20)
GFR: 83.49 mL/min (ref >60.00)
Glucose, Bld: 85 mg/dL (ref 70–99)
Potassium: 3.8 meq/L (ref 3.5–5.1)
Sodium: 142 meq/L (ref 135–145)

## 2023-07-15 LAB — LIPID PANEL
Cholesterol: 136 mg/dL (ref 0–200)
HDL: 41.8 mg/dL (ref >39.00)
LDL Cholesterol: 75 mg/dL (ref 0–99)
NonHDL: 94.65
Total CHOL/HDL Ratio: 3
Triglycerides: 97 mg/dL (ref 0.0–149.0)
VLDL: 19.4 mg/dL (ref 0.0–40.0)

## 2023-07-15 MED ORDER — HYDROXYCHLOROQUINE SULFATE 200 MG PO TABS
200.0000 mg | ORAL_TABLET | Freq: Two times a day (BID) | ORAL | 1 refills | Status: DC
  Filled 2023-07-15: qty 180, 90d supply, fill #0
  Filled 2023-10-14: qty 180, 90d supply, fill #1

## 2023-07-20 ENCOUNTER — Ambulatory Visit: Payer: 59 | Admitting: Internal Medicine

## 2023-07-20 ENCOUNTER — Other Ambulatory Visit: Payer: Self-pay

## 2023-07-20 VITALS — BP 128/74 | HR 72 | Temp 98.0°F | Resp 16 | Ht 62.0 in | Wt 213.0 lb

## 2023-07-20 DIAGNOSIS — E538 Deficiency of other specified B group vitamins: Secondary | ICD-10-CM

## 2023-07-20 DIAGNOSIS — I471 Supraventricular tachycardia, unspecified: Secondary | ICD-10-CM

## 2023-07-20 DIAGNOSIS — D473 Essential (hemorrhagic) thrombocythemia: Secondary | ICD-10-CM

## 2023-07-20 DIAGNOSIS — E7800 Pure hypercholesterolemia, unspecified: Secondary | ICD-10-CM | POA: Diagnosis not present

## 2023-07-20 DIAGNOSIS — F439 Reaction to severe stress, unspecified: Secondary | ICD-10-CM

## 2023-07-20 DIAGNOSIS — Z1211 Encounter for screening for malignant neoplasm of colon: Secondary | ICD-10-CM

## 2023-07-20 DIAGNOSIS — M069 Rheumatoid arthritis, unspecified: Secondary | ICD-10-CM | POA: Diagnosis not present

## 2023-07-20 MED ORDER — CYANOCOBALAMIN 1000 MCG/ML IJ SOLN
1000.0000 ug | Freq: Once | INTRAMUSCULAR | 0 refills | Status: AC
  Filled 2023-07-20: qty 1, 30d supply, fill #0

## 2023-07-21 ENCOUNTER — Telehealth: Payer: Self-pay | Admitting: Internal Medicine

## 2023-07-21 NOTE — Telephone Encounter (Signed)
Patient just called and said she had a question about her cyanocobalamin (VITAMIN B12) 1000 MCG/ML injection. She said she came in office and got the b12 injection and the [facility] pharmacy gave her the shot again. She would like for someone to call her. Her number is [phone]. Can someone give her a call.

## 2023-07-21 NOTE — Telephone Encounter (Signed)
B12 injection was ordered incorrectly and sent to pharmacy yesterday. Pt picked up. She will bring to next nurse visit and administer then

## 2023-07-24 ENCOUNTER — Encounter: Payer: Self-pay | Admitting: Internal Medicine

## 2023-07-24 NOTE — Assessment & Plan Note (Signed)
Follow cbc.  

## 2023-07-24 NOTE — Assessment & Plan Note (Signed)
Seeing Dr Allena Katz.   On cimzia and plaquenil.  Overall has done well on this medication.  Continue f/u with rheumatology.

## 2023-07-24 NOTE — Assessment & Plan Note (Signed)
Continues on toprol.  Stable.

## 2023-07-24 NOTE — Assessment & Plan Note (Signed)
Continue crestor.  Low cholesterol diet and exercise.  Follow lipid panel and liver function tests.   Lab Results  Component Value Date   CHOL 136 07/15/2023   HDL 41.80 07/15/2023   LDLCALC 75 07/15/2023   LDLDIRECT 87.0 04/03/2019   TRIG 97.0 07/15/2023   CHOLHDL 3 07/15/2023

## 2023-07-24 NOTE — Assessment & Plan Note (Signed)
Colonoscopy 06/2021 - recommended f/u in 3 years.

## 2023-07-24 NOTE — Assessment & Plan Note (Signed)
Increased stress. Discussed. She is seeing psychiatry.  On wellbutrin.  Overall appears to be doing better.  Continue f/u with psychiatry.

## 2023-07-27 DIAGNOSIS — S92351A Displaced fracture of fifth metatarsal bone, right foot, initial encounter for closed fracture: Secondary | ICD-10-CM | POA: Diagnosis not present

## 2023-07-29 ENCOUNTER — Encounter: Payer: Self-pay | Admitting: Internal Medicine

## 2023-07-29 DIAGNOSIS — S92356A Nondisplaced fracture of fifth metatarsal bone, unspecified foot, initial encounter for closed fracture: Secondary | ICD-10-CM | POA: Insufficient documentation

## 2023-08-02 DIAGNOSIS — F411 Generalized anxiety disorder: Secondary | ICD-10-CM | POA: Diagnosis not present

## 2023-08-05 ENCOUNTER — Other Ambulatory Visit: Payer: Self-pay

## 2023-08-05 MED ORDER — MELOXICAM 7.5 MG PO TABS
7.5000 mg | ORAL_TABLET | Freq: Two times a day (BID) | ORAL | 3 refills | Status: DC
  Filled 2023-08-18: qty 60, 30d supply, fill #0
  Filled 2023-09-14: qty 60, 30d supply, fill #1
  Filled 2023-10-14: qty 60, 30d supply, fill #2
  Filled 2023-11-10: qty 60, 30d supply, fill #3

## 2023-08-11 DIAGNOSIS — M0560 Rheumatoid arthritis of unspecified site with involvement of other organs and systems: Secondary | ICD-10-CM | POA: Diagnosis not present

## 2023-08-11 DIAGNOSIS — Z79899 Other long term (current) drug therapy: Secondary | ICD-10-CM | POA: Diagnosis not present

## 2023-08-11 DIAGNOSIS — Z8669 Personal history of other diseases of the nervous system and sense organs: Secondary | ICD-10-CM | POA: Diagnosis not present

## 2023-08-11 DIAGNOSIS — H43392 Other vitreous opacities, left eye: Secondary | ICD-10-CM | POA: Diagnosis not present

## 2023-08-11 DIAGNOSIS — H5213 Myopia, bilateral: Secondary | ICD-10-CM | POA: Diagnosis not present

## 2023-08-11 DIAGNOSIS — H35412 Lattice degeneration of retina, left eye: Secondary | ICD-10-CM | POA: Diagnosis not present

## 2023-08-15 DIAGNOSIS — M170 Bilateral primary osteoarthritis of knee: Secondary | ICD-10-CM | POA: Diagnosis not present

## 2023-08-16 DIAGNOSIS — M0609 Rheumatoid arthritis without rheumatoid factor, multiple sites: Secondary | ICD-10-CM | POA: Diagnosis not present

## 2023-08-19 ENCOUNTER — Other Ambulatory Visit: Payer: Self-pay

## 2023-08-22 ENCOUNTER — Ambulatory Visit (INDEPENDENT_AMBULATORY_CARE_PROVIDER_SITE_OTHER): Payer: 59

## 2023-08-22 DIAGNOSIS — E538 Deficiency of other specified B group vitamins: Secondary | ICD-10-CM

## 2023-08-22 DIAGNOSIS — M170 Bilateral primary osteoarthritis of knee: Secondary | ICD-10-CM | POA: Diagnosis not present

## 2023-08-22 MED ORDER — CYANOCOBALAMIN 1000 MCG/ML IJ SOLN
1000.0000 ug | Freq: Once | INTRAMUSCULAR | Status: AC
  Administered 2023-08-22: 1000 ug via INTRAMUSCULAR

## 2023-08-22 NOTE — Progress Notes (Signed)
Pt presented for their vitamin B12 injection. Pt was identified through two identifiers. Pt tolerated shot well in their right deltoid.  Pt stated b12 got ordered wrong and she brought in her B12 injection. Pt wanted me to make sure provider knows she prefers to receive her b12 injections in office she stated she spoke with Bethann Berkshire in regards to this already as well.

## 2023-08-23 DIAGNOSIS — F411 Generalized anxiety disorder: Secondary | ICD-10-CM | POA: Diagnosis not present

## 2023-08-24 ENCOUNTER — Encounter: Payer: Self-pay | Admitting: Internal Medicine

## 2023-08-24 DIAGNOSIS — M7541 Impingement syndrome of right shoulder: Secondary | ICD-10-CM | POA: Diagnosis not present

## 2023-08-24 DIAGNOSIS — S92351A Displaced fracture of fifth metatarsal bone, right foot, initial encounter for closed fracture: Secondary | ICD-10-CM | POA: Diagnosis not present

## 2023-08-24 DIAGNOSIS — M25511 Pain in right shoulder: Secondary | ICD-10-CM | POA: Insufficient documentation

## 2023-08-28 ENCOUNTER — Other Ambulatory Visit: Payer: Self-pay | Admitting: Internal Medicine

## 2023-08-29 ENCOUNTER — Encounter: Payer: Self-pay | Admitting: Internal Medicine

## 2023-08-29 DIAGNOSIS — M170 Bilateral primary osteoarthritis of knee: Secondary | ICD-10-CM | POA: Diagnosis not present

## 2023-08-30 ENCOUNTER — Other Ambulatory Visit: Payer: Self-pay

## 2023-08-30 ENCOUNTER — Other Ambulatory Visit: Payer: Self-pay | Admitting: Internal Medicine

## 2023-08-30 DIAGNOSIS — F411 Generalized anxiety disorder: Secondary | ICD-10-CM | POA: Diagnosis not present

## 2023-08-30 MED FILL — Gabapentin Cap 300 MG: ORAL | 30 days supply | Qty: 90 | Fill #0 | Status: AC

## 2023-08-30 NOTE — Telephone Encounter (Signed)
I am ok with referrals, but given symptoms, see if agreeable for an appt with me as well - to see if we can help with symptoms until appts with the specialists.

## 2023-08-30 NOTE — Telephone Encounter (Signed)
OK to refer her to nutritionist and GI?

## 2023-08-31 ENCOUNTER — Other Ambulatory Visit: Payer: Self-pay

## 2023-09-02 NOTE — Telephone Encounter (Signed)
Pt scheduled  

## 2023-09-05 ENCOUNTER — Other Ambulatory Visit: Payer: Self-pay

## 2023-09-05 ENCOUNTER — Other Ambulatory Visit: Payer: Self-pay | Admitting: Internal Medicine

## 2023-09-05 ENCOUNTER — Encounter: Payer: Self-pay | Admitting: Internal Medicine

## 2023-09-05 ENCOUNTER — Telehealth (INDEPENDENT_AMBULATORY_CARE_PROVIDER_SITE_OTHER): Payer: 59 | Admitting: Internal Medicine

## 2023-09-05 VITALS — Ht 62.0 in | Wt 213.4 lb

## 2023-09-05 DIAGNOSIS — F439 Reaction to severe stress, unspecified: Secondary | ICD-10-CM

## 2023-09-05 DIAGNOSIS — K219 Gastro-esophageal reflux disease without esophagitis: Secondary | ICD-10-CM

## 2023-09-05 DIAGNOSIS — M069 Rheumatoid arthritis, unspecified: Secondary | ICD-10-CM | POA: Diagnosis not present

## 2023-09-05 DIAGNOSIS — Z1211 Encounter for screening for malignant neoplasm of colon: Secondary | ICD-10-CM

## 2023-09-05 MED ORDER — ROSUVASTATIN CALCIUM 10 MG PO TABS
10.0000 mg | ORAL_TABLET | Freq: Every day | ORAL | 2 refills | Status: DC
  Filled 2023-09-05: qty 90, 90d supply, fill #0
  Filled 2023-11-15: qty 90, 90d supply, fill #1
  Filled 2024-03-02: qty 90, 90d supply, fill #2

## 2023-09-05 MED ORDER — PANTOPRAZOLE SODIUM 40 MG PO TBEC
40.0000 mg | DELAYED_RELEASE_TABLET | Freq: Every day | ORAL | 2 refills | Status: DC
  Filled 2023-09-05: qty 30, 30d supply, fill #0
  Filled 2023-09-24 – 2023-09-29 (×2): qty 30, 30d supply, fill #1
  Filled 2023-10-24: qty 30, 30d supply, fill #2

## 2023-09-05 NOTE — Progress Notes (Signed)
[name], female   DOB: [birth year], 52 y.o.   MRN: [id number]   Virtual Visit via video Note  I connected with Cl[place] by a video enabled telemedicine application and verified that I am speaking with the correct person using two identifiers. Location patient: home Location provider: work Persons participating in the virtual visit: patient, provider  The limitations, risks, security and privacy concerns of performing an evaluation and management service by video and the availability of in person appointments have been discussed. It has also been discussed with the patient that there may be a patient responsible charge related to this service. The patient expressed understanding and agreed to proceed.   Reason for visit: work in appt  HPI: Work in to discuss recent GI issues.  Persistent problems with arthritis.  Increased arthritis pain lately. Also reports recent increased acid reflux, gas and occasional diarrhea. Some stomach cramping. Taking pepcid. Discussed starting PPI.  Request seeing a nutritionist to help with diet adjustment to help with GI issues and arthritis. Discussed given persistent GI issues, specifically persistent increased acid reflux despite medication and persistent increased gas. Discussed referral to GI. Also seeing ortho - recent stress fracture of the right 5th metatarsal/possible sprain and right shoulder impingement. Given cortisone injection - right shoulder 08/24/23. Recommended non weight beating. Continues f/u with Dr Deeann Saint.    ROS: See pertinent positives and negatives per HPI.  Past Medical History:  Diagnosis Date   Allergy    Anemia    Anxiety    Arthritis    Blood in stool    H/O   Complication of anesthesia    Depression    GERD (gastroesophageal reflux disease)    History of chicken pox    Hx: UTI (urinary tract infection)    Hyperlipidemia    PONV (postoperative nausea and vomiting)     Past Surgical History:   Procedure Laterality Date   AUGMENTATION MAMMAPLASTY  09/21/1999   CESAREAN SECTION  2007 and 2009   COLONOSCOPY WITH PROPOFOL N/A 06/19/2021   Procedure: COLONOSCOPY WITH PROPOFOL;  Surgeon: Pasty Spillers, MD;  Location: [facility] ENDOSCOPY;  Service: Endoscopy;  Laterality: N/A;   COSMETIC SURGERY     tummy tuck  09/21/1999    Family History  Problem Relation Age of Onset   Healthy Mother    Hypertension Mother    Miscarriages / [place] Mother    Cancer Father        non hogkins lymphoma   Atrial fibrillation Father    Heart disease Father    Hypertension Father    Depression Father    Arthritis Sister    Asthma Sister    Hypertension Sister    Diabetes Sister    Healthy Daughter    Healthy Son    Alzheimer's disease Maternal Grandmother    Healthy Sister    Arthritis Maternal Grandfather    Colon cancer Paternal Uncle 98   Breast cancer Neg Hx     SOCIAL HX: reviewed.    Current Outpatient Medications:    acetaminophen (TYLENOL) 500 MG tablet, Take 500 mg by mouth every 6 (six) hours as needed., Disp: , Rfl:    ALPRAZolam (XANAX) 0.25 MG tablet, Take 1 tablet (0.25 mg total) by mouth daily as needed for anxiety., Disp: 30 tablet, Rfl: 0   buPROPion (WELLBUTRIN SR) 150 MG 12 hr tablet, Take 1 tablet (150 mg total) by mouth 2 (two) times daily at 8am and 1pm, Disp: 180  tablet, Rfl: 0   Certolizumab Pegol 2 X 200 MG/ML PSKT, Inject into the skin every 30 (thirty) days., Disp: , Rfl:    cetirizine (ZYRTEC) 10 MG tablet, Take 1 tablet (10 mg total) by mouth daily., Disp: 30 tablet, Rfl: 1   famotidine (PEPCID) 20 MG tablet, Take 20 mg by mouth 2 (two) times daily., Disp: , Rfl:    ferrous sulfate 325 (65 FE) MG tablet, Take 1 tablet (325 mg total) by mouth daily with breakfast., Disp: 30 tablet, Rfl: 1   fluticasone (FLONASE) 50 MCG/ACT nasal spray, Place 2 sprays into both nostrils daily., Disp: 16 g, Rfl: 3   gabapentin (NEURONTIN) 300 MG capsule, Take 3 capsules  (900 mg total) by mouth every evening., Disp: 90 capsule, Rfl: 1   hydroxychloroquine (PLAQUENIL) 200 MG tablet, Take 1 tablet (200 mg total) by mouth 2 (two) times daily., Disp: 180 tablet, Rfl: 1   Melatonin 10 MG TABS, Take 10 mg by mouth at bedtime., Disp: , Rfl:    meloxicam (MOBIC) 7.5 MG tablet, Take 1 tablet (7.5 mg total) by mouth 2 (two) times daily., Disp: 60 tablet, Rfl: 3   metoprolol succinate (TOPROL XL) 25 MG 24 hr tablet, Take 1 tablet (25 mg total) by mouth daily., Disp: 90 tablet, Rfl: 3   pantoprazole (PROTONIX) 40 MG tablet, Take 1 tablet (40 mg total) by mouth daily., Disp: 30 tablet, Rfl: 2   rosuvastatin (CRESTOR) 10 MG tablet, Take 1 tablet (10 mg total) by mouth daily., Disp: 90 tablet, Rfl: 2  EXAM:  GENERAL: alert, oriented, appears well and in no acute distress  HEENT: atraumatic, conjunttiva clear, no obvious abnormalities on inspection of external nose and ears  NECK: normal movements of the head and neck  LUNGS: on inspection no signs of respiratory distress, breathing rate appears normal, no obvious gross SOB, gasping or wheezing  CV: no obvious cyanosis  PSYCH/NEURO: pleasant and cooperative, no obvious depression or anxiety, speech and thought processing grossly intact  ASSESSMENT AND PLAN:  Discussed the following assessment and plan:  Problem List Items Addressed This Visit     Colon cancer screening   Colonoscopy 06/19/21 - multiple polyps. Recommended f/u colonoscopy in 3 years       GERD (gastroesophageal reflux disease)   With increased acid reflux, gas and bowel issues as outlined.  Discussed.  Taking pepcid. Discussed PPI. Also discussed given persistent issues referral back to GI for f/u and evaluation.  Also, request nutritionist referral to discuss diet adjustment.       Relevant Medications   pantoprazole (PROTONIX) 40 MG tablet   Other Relevant Orders   Ambulatory referral to Gastroenterology   Amb ref to Medical Nutrition  Therapy-MNT   Rheumatoid arthritis (HCC) - Primary   Seeing Dr Allena Katz.   On cimzia and plaquenil.  Overall has done relatively well on this medication.  Continue f/u with rheumatology. With some increased arthritis issues recently, request referral to a nutritionist to discuss diet.       Relevant Orders   Amb ref to Medical Nutrition Therapy-MNT   Stress   Increased stress. On wellbutrin.  Overall appears to be doing better.  Continue f/u with psychiatry.        Return if symptoms worsen or fail to improve, for keep scheduled. .   I discussed the assessment and treatment plan with the patient. The patient was provided an opportunity to ask questions and all were answered. The patient agreed with the plan  and demonstrated an understanding of the instructions.   The patient was advised to call back or seek an in-person evaluation if the symptoms worsen or if the condition fails to improve as anticipated.    Dale [place], MD

## 2023-09-06 DIAGNOSIS — F411 Generalized anxiety disorder: Secondary | ICD-10-CM | POA: Diagnosis not present

## 2023-09-10 ENCOUNTER — Encounter: Payer: Self-pay | Admitting: Internal Medicine

## 2023-09-10 DIAGNOSIS — K219 Gastro-esophageal reflux disease without esophagitis: Secondary | ICD-10-CM | POA: Insufficient documentation

## 2023-09-10 NOTE — Assessment & Plan Note (Signed)
Colonoscopy 06/19/21 - multiple polyps. Recommended f/u colonoscopy in 3 years

## 2023-09-10 NOTE — Assessment & Plan Note (Signed)
With increased acid reflux, gas and bowel issues as outlined.  Discussed.  Taking pepcid. Discussed PPI. Also discussed given persistent issues referral back to GI for f/u and evaluation.  Also, request nutritionist referral to discuss diet adjustment.

## 2023-09-10 NOTE — Assessment & Plan Note (Signed)
Seeing Dr Allena Katz.   On cimzia and plaquenil.  Overall has done relatively well on this medication.  Continue f/u with rheumatology. With some increased arthritis issues recently, request referral to a nutritionist to discuss diet.

## 2023-09-10 NOTE — Assessment & Plan Note (Signed)
Increased stress. On wellbutrin.  Overall appears to be doing better.  Continue f/u with psychiatry.

## 2023-09-16 DIAGNOSIS — S92354A Nondisplaced fracture of fifth metatarsal bone, right foot, initial encounter for closed fracture: Secondary | ICD-10-CM | POA: Diagnosis not present

## 2023-09-16 DIAGNOSIS — Q66221 Congenital metatarsus adductus, right foot: Secondary | ICD-10-CM | POA: Diagnosis not present

## 2023-09-16 DIAGNOSIS — Z8739 Personal history of other diseases of the musculoskeletal system and connective tissue: Secondary | ICD-10-CM | POA: Diagnosis not present

## 2023-09-16 DIAGNOSIS — M79671 Pain in right foot: Secondary | ICD-10-CM | POA: Diagnosis not present

## 2023-09-16 DIAGNOSIS — Q6671 Congenital pes cavus, right foot: Secondary | ICD-10-CM | POA: Diagnosis not present

## 2023-09-16 DIAGNOSIS — I739 Peripheral vascular disease, unspecified: Secondary | ICD-10-CM | POA: Diagnosis not present

## 2023-09-20 ENCOUNTER — Other Ambulatory Visit (INDEPENDENT_AMBULATORY_CARE_PROVIDER_SITE_OTHER): Payer: Self-pay | Admitting: Podiatry

## 2023-09-20 DIAGNOSIS — F411 Generalized anxiety disorder: Secondary | ICD-10-CM | POA: Diagnosis not present

## 2023-09-20 DIAGNOSIS — I739 Peripheral vascular disease, unspecified: Secondary | ICD-10-CM

## 2023-09-21 ENCOUNTER — Encounter: Payer: Self-pay | Admitting: Internal Medicine

## 2023-09-22 ENCOUNTER — Ambulatory Visit (INDEPENDENT_AMBULATORY_CARE_PROVIDER_SITE_OTHER): Payer: Self-pay

## 2023-09-22 ENCOUNTER — Ambulatory Visit: Payer: 59

## 2023-09-22 DIAGNOSIS — I739 Peripheral vascular disease, unspecified: Secondary | ICD-10-CM | POA: Diagnosis not present

## 2023-09-24 MED FILL — Gabapentin Cap 300 MG: ORAL | 30 days supply | Qty: 90 | Fill #1 | Status: AC

## 2023-09-25 ENCOUNTER — Other Ambulatory Visit: Payer: Self-pay

## 2023-09-26 ENCOUNTER — Other Ambulatory Visit: Payer: Self-pay

## 2023-09-26 DIAGNOSIS — M0609 Rheumatoid arthritis without rheumatoid factor, multiple sites: Secondary | ICD-10-CM | POA: Diagnosis not present

## 2023-09-26 DIAGNOSIS — Z7960 Long term (current) use of unspecified immunomodulators and immunosuppressants: Secondary | ICD-10-CM | POA: Diagnosis not present

## 2023-09-26 DIAGNOSIS — F320 Major depressive disorder, single episode, mild: Secondary | ICD-10-CM | POA: Diagnosis not present

## 2023-09-26 DIAGNOSIS — F5105 Insomnia due to other mental disorder: Secondary | ICD-10-CM | POA: Diagnosis not present

## 2023-09-26 DIAGNOSIS — F419 Anxiety disorder, unspecified: Secondary | ICD-10-CM | POA: Diagnosis not present

## 2023-09-26 DIAGNOSIS — M150 Primary generalized (osteo)arthritis: Secondary | ICD-10-CM | POA: Diagnosis not present

## 2023-09-26 MED ORDER — BUPROPION HCL ER (SR) 150 MG PO TB12
150.0000 mg | ORAL_TABLET | Freq: Two times a day (BID) | ORAL | 1 refills | Status: DC
  Filled 2023-09-26 – 2023-11-10 (×2): qty 180, 90d supply, fill #0
  Filled 2024-01-29: qty 180, 90d supply, fill #1

## 2023-09-29 DIAGNOSIS — F411 Generalized anxiety disorder: Secondary | ICD-10-CM | POA: Diagnosis not present

## 2023-09-30 ENCOUNTER — Encounter: Payer: Self-pay | Admitting: Internal Medicine

## 2023-10-03 ENCOUNTER — Other Ambulatory Visit: Payer: Self-pay | Admitting: Internal Medicine

## 2023-10-03 ENCOUNTER — Telehealth: Payer: Self-pay

## 2023-10-03 DIAGNOSIS — M0609 Rheumatoid arthritis without rheumatoid factor, multiple sites: Secondary | ICD-10-CM | POA: Diagnosis not present

## 2023-10-03 DIAGNOSIS — Z1231 Encounter for screening mammogram for malignant neoplasm of breast: Secondary | ICD-10-CM

## 2023-10-03 NOTE — Telephone Encounter (Signed)
 Copied from CRM [id number]. Topic: General - Other >> Oct 03, 2023  9:39 AM Corin V wrote: Reason for CRM: Patient has foot surgery on 11/10/23 and Dr. Glendia has told her that she wants to see her before she has that surgery. Unable to find any spot open before that day. Please call patient back to reschedule the office visit per Dr. Glendia and the accompanying labs..  I called patient and rescheduled her appointment with Dr. Allena Glendia and her lab visit, both are now prior to her surgery date.

## 2023-10-05 ENCOUNTER — Ambulatory Visit (INDEPENDENT_AMBULATORY_CARE_PROVIDER_SITE_OTHER): Payer: 59

## 2023-10-05 DIAGNOSIS — E538 Deficiency of other specified B group vitamins: Secondary | ICD-10-CM | POA: Diagnosis not present

## 2023-10-05 MED ORDER — CYANOCOBALAMIN 1000 MCG/ML IJ SOLN
1000.0000 ug | Freq: Once | INTRAMUSCULAR | Status: AC
  Administered 2023-10-05: 1000 ug via INTRAMUSCULAR

## 2023-10-05 NOTE — Progress Notes (Signed)
 Patient is in office today for a nurse visit for B12 Injection. Patient Injection was given in the  Left deltoid. Patient tolerated injection well.

## 2023-10-06 DIAGNOSIS — F411 Generalized anxiety disorder: Secondary | ICD-10-CM | POA: Diagnosis not present

## 2023-10-11 ENCOUNTER — Encounter: Payer: Self-pay | Admitting: Internal Medicine

## 2023-10-11 DIAGNOSIS — F411 Generalized anxiety disorder: Secondary | ICD-10-CM | POA: Diagnosis not present

## 2023-10-11 NOTE — Telephone Encounter (Signed)
Letter printed.

## 2023-10-18 ENCOUNTER — Telehealth: Payer: Self-pay | Admitting: Internal Medicine

## 2023-10-18 DIAGNOSIS — E7800 Pure hypercholesterolemia, unspecified: Secondary | ICD-10-CM

## 2023-10-18 NOTE — Telephone Encounter (Signed)
Patient need lab orders.

## 2023-10-20 NOTE — Telephone Encounter (Signed)
Labs ordered.

## 2023-10-20 NOTE — Addendum Note (Signed)
Addended by: Rita Ohara D on: 10/20/2023 10:19 AM   Modules accepted: Orders

## 2023-10-21 DIAGNOSIS — F411 Generalized anxiety disorder: Secondary | ICD-10-CM | POA: Diagnosis not present

## 2023-10-24 ENCOUNTER — Other Ambulatory Visit: Payer: Self-pay

## 2023-10-24 ENCOUNTER — Other Ambulatory Visit: Payer: Self-pay | Admitting: Internal Medicine

## 2023-10-25 ENCOUNTER — Other Ambulatory Visit: Payer: Self-pay

## 2023-10-25 MED FILL — Gabapentin Cap 300 MG: ORAL | 30 days supply | Qty: 90 | Fill #0 | Status: AC

## 2023-10-26 ENCOUNTER — Other Ambulatory Visit (INDEPENDENT_AMBULATORY_CARE_PROVIDER_SITE_OTHER): Payer: 59

## 2023-10-26 ENCOUNTER — Other Ambulatory Visit: Payer: Self-pay

## 2023-10-26 DIAGNOSIS — Q66221 Congenital metatarsus adductus, right foot: Secondary | ICD-10-CM | POA: Diagnosis not present

## 2023-10-26 DIAGNOSIS — S92354D Nondisplaced fracture of fifth metatarsal bone, right foot, subsequent encounter for fracture with routine healing: Secondary | ICD-10-CM | POA: Diagnosis not present

## 2023-10-26 DIAGNOSIS — Z8739 Personal history of other diseases of the musculoskeletal system and connective tissue: Secondary | ICD-10-CM | POA: Diagnosis not present

## 2023-10-26 DIAGNOSIS — M79671 Pain in right foot: Secondary | ICD-10-CM | POA: Diagnosis not present

## 2023-10-26 DIAGNOSIS — E7800 Pure hypercholesterolemia, unspecified: Secondary | ICD-10-CM

## 2023-10-26 DIAGNOSIS — Q6671 Congenital pes cavus, right foot: Secondary | ICD-10-CM | POA: Diagnosis not present

## 2023-10-26 LAB — BASIC METABOLIC PANEL
BUN: 12 mg/dL (ref 6–23)
CO2: 28 meq/L (ref 19–32)
Calcium: 8.5 mg/dL (ref 8.4–10.5)
Chloride: 107 meq/L (ref 96–112)
Creatinine, Ser: 0.81 mg/dL (ref 0.40–1.20)
GFR: 83.33 mL/min (ref >60.00)
Glucose, Bld: 88 mg/dL (ref 70–99)
Potassium: 3.6 meq/L (ref 3.5–5.1)
Sodium: 142 meq/L (ref 135–145)

## 2023-10-26 LAB — LIPID PANEL
Cholesterol: 142 mg/dL (ref 0–200)
HDL: 47.9 mg/dL (ref >39.00)
LDL Cholesterol: 71 mg/dL (ref 0–99)
NonHDL: 94.19
Total CHOL/HDL Ratio: 3
Triglycerides: 117 mg/dL (ref 0.0–149.0)
VLDL: 23.4 mg/dL (ref 0.0–40.0)

## 2023-10-26 LAB — HEPATIC FUNCTION PANEL
ALT: 21 U/L (ref 0–35)
AST: 20 U/L (ref 0–37)
Albumin: 4 g/dL (ref 3.5–5.2)
Alkaline Phosphatase: 65 U/L (ref 39–117)
Bilirubin, Direct: 0.1 mg/dL (ref 0.0–0.3)
Total Bilirubin: 0.4 mg/dL (ref 0.2–1.2)
Total Protein: 6.1 g/dL (ref 6.0–8.3)

## 2023-10-26 LAB — TSH: TSH: 1.92 u[IU]/mL (ref 0.35–5.50)

## 2023-10-27 ENCOUNTER — Other Ambulatory Visit: Payer: Self-pay

## 2023-10-28 ENCOUNTER — Ambulatory Visit: Payer: 59 | Admitting: Internal Medicine

## 2023-10-28 ENCOUNTER — Encounter: Payer: Self-pay | Admitting: Internal Medicine

## 2023-10-28 VITALS — BP 128/70 | HR 67 | Temp 98.8°F | Ht 62.0 in | Wt 218.4 lb

## 2023-10-28 DIAGNOSIS — S92901D Unspecified fracture of right foot, subsequent encounter for fracture with routine healing: Secondary | ICD-10-CM

## 2023-10-28 DIAGNOSIS — R42 Dizziness and giddiness: Secondary | ICD-10-CM

## 2023-10-28 DIAGNOSIS — F439 Reaction to severe stress, unspecified: Secondary | ICD-10-CM

## 2023-10-28 DIAGNOSIS — Z1211 Encounter for screening for malignant neoplasm of colon: Secondary | ICD-10-CM

## 2023-10-28 DIAGNOSIS — D649 Anemia, unspecified: Secondary | ICD-10-CM | POA: Diagnosis not present

## 2023-10-28 DIAGNOSIS — K219 Gastro-esophageal reflux disease without esophagitis: Secondary | ICD-10-CM | POA: Diagnosis not present

## 2023-10-28 DIAGNOSIS — E7800 Pure hypercholesterolemia, unspecified: Secondary | ICD-10-CM | POA: Diagnosis not present

## 2023-10-28 DIAGNOSIS — D473 Essential (hemorrhagic) thrombocythemia: Secondary | ICD-10-CM

## 2023-10-28 DIAGNOSIS — I471 Supraventricular tachycardia, unspecified: Secondary | ICD-10-CM

## 2023-10-28 DIAGNOSIS — M069 Rheumatoid arthritis, unspecified: Secondary | ICD-10-CM | POA: Diagnosis not present

## 2023-10-28 NOTE — Progress Notes (Signed)
 Subjective:    [name], female    DOB: [birth year], 53 y.o.   MRN: [id number]  Patient here for  Chief Complaint  Patient presents with   Medical Management of Chronic Issues    Hormones and sweating    HPI Here for a scheduled follow up - follow up regarding increased stress, hypercholesterolemia and RA. Saw Dr patel 05/06/23 - taking plaquenil  and cimzia . Just saw podiatry 10/26/23 - f/u right fifth metatarsal fracture - date of injury 07/26/23. Xray revealed previous fracture to be healed. Transition to support shoes. Discussed gradually increasing activity and exercise. Last visit, increased acid reflux - added pepcid. Has f/u with GI 3/27. Also planning to see a nutritionist 2/17. Has noticed around 6-7pm will feel dizzy, increased sweats. Does not occur everyday, but may occur several times per week. Reports has to sit down. Does not occur any other time during the day. Started a couple of months ago. May last 5-15 minutes. No known triggers. Discussed possible etiologies - low blood sugar, blood pressure variation, etc. No other symptoms. No chest pain. Breathing stable. No vomiting or nausea.    Past Medical History:  Diagnosis Date   Allergy    Anemia    Anxiety    Arthritis    Blood in stool    H/O   Complication of anesthesia    Depression    GERD (gastroesophageal reflux disease)    History of chicken pox    Hx: UTI (urinary tract infection)    Hyperlipidemia    PONV (postoperative nausea and vomiting)    Past Surgical History:  Procedure Laterality Date   AUGMENTATION MAMMAPLASTY  09/21/1999   CESAREAN SECTION  2007 and 2009   COLONOSCOPY WITH PROPOFOL  N/A 06/19/2021   Procedure: COLONOSCOPY WITH PROPOFOL ;  Surgeon: Janalyn Keene NOVAK, MD;  Location: [facility] ENDOSCOPY;  Service: Endoscopy;  Laterality: N/A;   COSMETIC SURGERY     tummy tuck  09/21/1999   Family History  Problem Relation Age of Onset   Healthy Mother    Hypertension Mother     Miscarriages / Stillbirths Mother    Cancer Father        non hogkins lymphoma   Atrial fibrillation Father    Heart disease Father    Hypertension Father    Depression Father    Arthritis Sister    Asthma Sister    Hypertension Sister    Diabetes Sister    Healthy Daughter    Healthy Son    Alzheimer's disease Maternal Grandmother    Healthy Sister    Arthritis Maternal Grandfather    Colon cancer Paternal Uncle 91   Breast cancer Neg Hx    Social History   Socioeconomic History   Marital status: Married    Spouse name: Not on file   Number of children: Not on file   Years of education: Not on file   Highest education level: Associate degree: academic program  Occupational History   Not on file  Tobacco Use   Smoking status: Never   Smokeless tobacco: Never  Vaping Use   Vaping status: Never Used  Substance and Sexual Activity   Alcohol use: Yes    Alcohol/week: 5.0 standard drinks of alcohol    Types: 5 Cans of beer per week    Comment: occasional   Drug use: No   Sexual activity: Yes    Birth control/protection: None    Comment: Husband had a vasectomy  Other  Topics Concern   Not on file  Social History Narrative   Not on file   Social Drivers of Health   Financial Resource Strain: Low Risk  (07/19/2023)   Overall Financial Resource Strain (CARDIA)    Difficulty of Paying Living Expenses: Not hard at all  Food Insecurity: No Food Insecurity (07/19/2023)   Hunger Vital Sign    Worried About Running Out of Food in the Last Year: Never true    Ran Out of Food in the Last Year: Never true  Transportation Needs: No Transportation Needs (07/19/2023)   PRAPARE - Administrator, Civil Service (Medical): No    Lack of Transportation (Non-Medical): No  Physical Activity: Unknown (07/19/2023)   Exercise Vital Sign    Days of Exercise per Week: 0 days    Minutes of Exercise per Session: Not on file  Stress: Stress Concern Present (07/19/2023)    [employer] of Occupational Health - Occupational Stress Questionnaire    Feeling of Stress : Rather much  Social Connections: Socially Integrated (07/19/2023)   Social Connection and Isolation Panel [NHANES]    Frequency of Communication with Friends and Family: More than three times a week    Frequency of Social Gatherings with Friends and Family: Once a week    Attends Religious Services: More than 4 times per year    Active Member of [employer] or Organizations: Yes    Attends Engineer, Structural: More than 4 times per year    Marital Status: Married     Review of Systems  Constitutional:  Negative for appetite change and unexpected weight change.  HENT:  Negative for congestion and sinus pressure.   Respiratory:  Negative for cough, chest tightness and shortness of breath.   Cardiovascular:  Negative for chest pain and palpitations.  Gastrointestinal:  Negative for abdominal pain, diarrhea, nausea and vomiting.  Genitourinary:  Negative for difficulty urinating and dysuria.  Musculoskeletal:  Negative for myalgias.  Skin:  Negative for color change and rash.  Neurological:  Negative for headaches.       Intermittent episodes of dizziness and sweats as outlined   Psychiatric/Behavioral:  Negative for agitation and dysphoric mood.        Objective:     BP 128/70   Pulse 67   Temp 98.8 F (37.1 C) (Oral)   Ht 5' 2 (1.575 m)   Wt 218 lb 6.4 oz (99.1 kg)   LMP  (LMP Unknown)   SpO2 97%   BMI 39.95 kg/m  Wt Readings from Last 3 Encounters:  10/28/23 218 lb 6.4 oz (99.1 kg)  09/05/23 213 lb 6.4 oz (96.8 kg)  07/20/23 213 lb (96.6 kg)    Physical Exam Vitals reviewed.  Constitutional:      General: She is not in acute distress.    Appearance: Normal appearance.  HENT:     Head: Normocephalic and atraumatic.     Right Ear: External ear normal.     Left Ear: External ear normal.     Mouth/Throat:     Pharynx: No oropharyngeal exudate or posterior  oropharyngeal erythema.  Eyes:     General: No scleral icterus.       Right eye: No discharge.        Left eye: No discharge.     Conjunctiva/sclera: Conjunctivae normal.  Neck:     Thyroid : No thyromegaly.  Cardiovascular:     Rate and Rhythm: Normal rate and regular rhythm.  Pulmonary:  Effort: No respiratory distress.     Breath sounds: Normal breath sounds. No wheezing.  Abdominal:     General: Bowel sounds are normal.     Palpations: Abdomen is soft.     Tenderness: There is no abdominal tenderness.  Musculoskeletal:        General: No swelling or tenderness.     Cervical back: Neck supple. No tenderness.  Lymphadenopathy:     Cervical: No cervical adenopathy.  Skin:    Findings: No erythema or rash.  Neurological:     Mental Status: She is alert.  Psychiatric:        Mood and Affect: Mood normal.        Behavior: Behavior normal.         Outpatient Encounter Medications as of 10/28/2023  Medication Sig   acetaminophen (TYLENOL) 500 MG tablet Take 500 mg by mouth every 6 (six) hours as needed.   ALPRAZolam  (XANAX ) 0.25 MG tablet Take 1 tablet (0.25 mg total) by mouth daily as needed for anxiety.   buPROPion  (WELLBUTRIN  SR) 150 MG 12 hr tablet Take 1 tablet (150 mg total) by mouth 2 (two) times daily at 8am and 1pm.   Certolizumab Pegol  2 X 200 MG/ML PSKT Inject into the skin every 30 (thirty) days.   cetirizine  (ZYRTEC ) 10 MG tablet Take 1 tablet (10 mg total) by mouth daily.   famotidine (PEPCID) 20 MG tablet Take 20 mg by mouth 2 (two) times daily.   ferrous sulfate  325 (65 FE) MG tablet Take 1 tablet (325 mg total) by mouth daily with breakfast.   fluticasone  (FLONASE ) 50 MCG/ACT nasal spray Place 2 sprays into both nostrils daily.   gabapentin  (NEURONTIN ) 300 MG capsule Take 3 capsules (900 mg total) by mouth every evening.   hydroxychloroquine  (PLAQUENIL ) 200 MG tablet Take 1 tablet (200 mg total) by mouth 2 (two) times daily.   Melatonin 10 MG TABS Take 10  mg by mouth at bedtime.   meloxicam  (MOBIC ) 7.5 MG tablet Take 1 tablet (7.5 mg total) by mouth 2 (two) times daily.   metoprolol  succinate (TOPROL  XL) 25 MG 24 hr tablet Take 1 tablet (25 mg total) by mouth daily.   pantoprazole  (PROTONIX ) 40 MG tablet Take 1 tablet (40 mg total) by mouth daily.   rosuvastatin  (CRESTOR ) 10 MG tablet Take 1 tablet (10 mg total) by mouth daily.   No facility-administered encounter medications on file as of 10/28/2023.     Lab Results  Component Value Date   WBC 6.5 10/29/2022   HGB 13.2 10/29/2022   HCT 39.4 10/29/2022   PLT 271.0 10/29/2022   GLUCOSE 88 10/26/2023   CHOL 142 10/26/2023   TRIG 117.0 10/26/2023   HDL 47.90 10/26/2023   LDLDIRECT 87.0 04/03/2019   LDLCALC 71 10/26/2023   ALT 21 10/26/2023   AST 20 10/26/2023   NA 142 10/26/2023   K 3.6 10/26/2023   CL 107 10/26/2023   CREATININE 0.81 10/26/2023   BUN 12 10/26/2023   CO2 28 10/26/2023   TSH 1.92 10/26/2023    MM 3D SCREEN BREAST W/IMPLANT BILATERAL Result Date: 10/28/2022 CLINICAL DATA:  Screening. EXAM: DIGITAL SCREENING BILATERAL MAMMOGRAM WITH IMPLANTS, CAD AND TOMOSYNTHESIS TECHNIQUE: Bilateral screening digital craniocaudal and mediolateral oblique mammograms were obtained. Bilateral screening digital breast tomosynthesis was performed. The images were evaluated with computer-aided detection. Standard and/or implant displaced views were performed. COMPARISON:  Previous exam(s). ACR Breast Density Category b: There are scattered areas of fibroglandular density. FINDINGS:  The patient has retroglandular implants. There are no findings suspicious for malignancy. IMPRESSION: No mammographic evidence of malignancy. A result letter of this screening mammogram will be mailed directly to the patient. RECOMMENDATION: Screening mammogram in one year. (Code:SM-B-01Y) BI-RADS CATEGORY  1: Negative. Electronically Signed   By: Dobrinka  Dimitrova M.D.   On: 10/28/2022 14:01       Assessment &  Plan:  Hypercholesterolemia Assessment & Plan: Continue crestor .  Low cholesterol diet and exercise.  Follow lipid panel and liver function tests.   Lab Results  Component Value Date   CHOL 142 10/26/2023   HDL 47.90 10/26/2023   LDLCALC 71 10/26/2023   LDLDIRECT 87.0 04/03/2019   TRIG 117.0 10/26/2023   CHOLHDL 3 10/26/2023     Orders: -     Lipid panel; Future -     Hepatic function panel; Future -     Basic metabolic panel; Future  Stress Assessment & Plan: Increased stress. On wellbutrin .  Overall appears to be doing better.  Continue f/u with psychiatry.    Rheumatoid arthritis, involving unspecified site, unspecified whether rheumatoid factor present [facility]) Assessment & Plan: Seeing Dr Tobie.   On cimzia  and plaquenil .  Overall has done relatively well on this medication.  Continue f/u with rheumatology. With some increased arthritis issues recently, previously requested referral to a nutritionist to discuss diet. Has appt 2/17.    Paroxysmal SVT (supraventricular tachycardia) (HCC) Assessment & Plan: Continues on toprol .  Stable.    Gastroesophageal reflux disease, unspecified whether esophagitis present Assessment & Plan: Protonix /pepcid - appears to be doing better.    Closed fracture of right foot with routine healing, subsequent encounter Assessment & Plan: Recent evaluation podiatry. Fracture healed. Discussed gradually increasing exercise and stretches.    Essential (hemorrhagic) thrombocythemia (HCC) Assessment & Plan: Follow cbc.    Colon cancer screening Assessment & Plan: Colonoscopy 06/19/21 - multiple polyps. Recommended f/u colonoscopy in 3 years    Anemia, unspecified type Assessment & Plan: Follow cbc.    Dizziness Assessment & Plan: Intermittent episodes as outlined. No clear etiology. Discussed possible etiologies. Not orthostatic on exam. Appears to occur prior to eating her evening meal. Discussed possible low sugar. Will check  glucose when occurs. Also, monitor for other possible triggers. Discussed further w/up, including EKG and further cardiac w/up. Wants to start with above monitoring first. Follow. Notify if persistent.       Allena Hamilton, MD

## 2023-10-30 ENCOUNTER — Encounter: Payer: Self-pay | Admitting: Internal Medicine

## 2023-10-30 DIAGNOSIS — R42 Dizziness and giddiness: Secondary | ICD-10-CM | POA: Insufficient documentation

## 2023-10-30 NOTE — Assessment & Plan Note (Signed)
 Continues on toprol.  Stable.

## 2023-10-30 NOTE — Assessment & Plan Note (Signed)
 Seeing Dr Lydia Sams.   On cimzia  and plaquenil .  Overall has done relatively well on this medication.  Continue f/u with rheumatology. With some increased arthritis issues recently, previously requested referral to a nutritionist to discuss diet. Has appt 2/17

## 2023-10-30 NOTE — Assessment & Plan Note (Signed)
 Follow cbc.

## 2023-10-30 NOTE — Assessment & Plan Note (Signed)
 Colonoscopy 06/19/21 - multiple polyps. Recommended f/u colonoscopy in 3 years

## 2023-10-30 NOTE — Assessment & Plan Note (Signed)
 Continue crestor .  Low cholesterol diet and exercise.  Follow lipid panel and liver function tests.   Lab Results  Component Value Date   CHOL 142 10/26/2023   HDL 47.90 10/26/2023   LDLCALC 71 10/26/2023   LDLDIRECT 87.0 04/03/2019   TRIG 117.0 10/26/2023   CHOLHDL 3 10/26/2023

## 2023-10-30 NOTE — Assessment & Plan Note (Signed)
 Intermittent episodes as outlined. No clear etiology. Discussed possible etiologies. Not orthostatic on exam. Appears to occur prior to eating her evening meal. Discussed possible low sugar. Will check glucose when occurs. Also, monitor for other possible triggers. Discussed further w/up, including EKG and further cardiac w/up. Wants to start with above monitoring first. Follow. Notify if persistent.

## 2023-10-30 NOTE — Assessment & Plan Note (Signed)
 Protonix /pepcid - appears to be doing better.

## 2023-10-30 NOTE — Assessment & Plan Note (Signed)
 Recent evaluation podiatry. Fracture healed. Discussed gradually increasing exercise and stretches.

## 2023-10-30 NOTE — Assessment & Plan Note (Signed)
 Increased stress. On wellbutrin.  Overall appears to be doing better.  Continue f/u with psychiatry.

## 2023-11-01 ENCOUNTER — Ambulatory Visit
Admission: RE | Admit: 2023-11-01 | Discharge: 2023-11-01 | Disposition: A | Discharge status: Home / Self Care | Payer: 59 | Source: Ambulatory Visit | Attending: Internal Medicine | Admitting: Internal Medicine

## 2023-11-01 DIAGNOSIS — Z1231 Encounter for screening mammogram for malignant neoplasm of breast: Secondary | ICD-10-CM | POA: Diagnosis not present

## 2023-11-01 DIAGNOSIS — F411 Generalized anxiety disorder: Secondary | ICD-10-CM | POA: Diagnosis not present

## 2023-11-03 ENCOUNTER — Other Ambulatory Visit: Payer: Self-pay

## 2023-11-07 ENCOUNTER — Encounter: Payer: 59 | Attending: Internal Medicine | Admitting: Dietician

## 2023-11-07 ENCOUNTER — Encounter: Payer: Self-pay | Admitting: Dietician

## 2023-11-07 VITALS — Ht 62.0 in | Wt 217.9 lb

## 2023-11-07 DIAGNOSIS — K219 Gastro-esophageal reflux disease without esophagitis: Secondary | ICD-10-CM

## 2023-11-07 DIAGNOSIS — M069 Rheumatoid arthritis, unspecified: Secondary | ICD-10-CM

## 2023-11-07 DIAGNOSIS — Z6838 Body mass index (BMI) 38.0-38.9, adult: Secondary | ICD-10-CM

## 2023-11-07 NOTE — Patient Instructions (Signed)
 Plan ahead for healthy snack and light meal options; make use of "good" days to pre-prep foods for daytime eating.  Increase veggies and fruits ie have veggies and hummus; whole grain crisps and guacamole; vanilla Greek yogurt blended with 1/3 measure peanut butter, 1 Tbsp honey-- use as dip for fruits or graham crackers to satisfy sweet taste. Incorporate some light activity for a few minutes at a time, one or more times a day. Gradually increase the time as able.  Allow at least 2-3 hours between snacks, but eat at least every 4-5 hours during the day.  Limit the amount of less-healthy snacks in the home

## 2023-11-07 NOTE — Progress Notes (Signed)
 Medical Nutrition Therapy: Visit start time: 1315  end time: 1430  Assessment:   Referral Diagnosis: GERD, rheumatoid arthritis Other medical history/ diagnoses: hyperlipidemia Psychosocial issues/ stress concerns: depression, anxiety  Medications, supplements: reconciled list in medical record   Current weight: 217.9lbs Height: 5'2" BMI: 39.85   Progress and evaluation:  Patient reports weight has recently increased due to limited activity while wearing cast for several months.   RA limits cooking ability; hand pain and stiffness. Husband cooks most dinner meals. Teenage children in home also. Food allergies: none known; reports throat irritation after eating salmon, but has avoided since. Does not know if salmon caused reaction or if GERD had irritated esophagus GERD symptoms have improved with pantoprazole Special diet practices: none Patient seeks help with plan for weight loss for self and family    Dietary Intake:  Usual eating pattern includes 1 meals and several snacks per day. Dining out frequency: 1-2 meals per week. Who plans meals/ buys groceries? Self, spouse Who prepares meals? Spouse, occasionally self  Breakfast: usually skips; drinks coffee Snack: popcorn; ritz bits with peanut butter; fruit gummy snacks; sweets Lunch: snacks as listed below; nuts or other protein at some point during the day.  Snack: same as am Supper: spaghetti with meatballs; chicken and veg; tacos; pork tenderloin Snack: occasionally sweet snack if dinner is early Beverages: coffee; sweet tea; water  Physical activity: no regular activity at this time; increased activity causes more pain and fatigue for 1-3 days after   Intervention:   Nutrition Care Education:   Basic nutrition: basic food groups; appropriate nutrient balance; appropriate meal and snack schedule; general nutrition guidelines    Weight control: importance of low sugar and low fat choices; portion control; estimated  energy needs for weight loss at 1300 kcal, provided guidance for 45% CHO, 25% pro, (30% fat); options for gradually increasing physical activity Advanced nutrition:  cooking techniques -- pre-prepping light meals and snacks for daytime nourishment Rheumatoid arthritis: principles of Mediterranean eating pattern including increased intakeof veg and fruits, healthy fats, beans, nuts/ seeds; limiting processed foods  Other intervention notes: Patient has been working to implement some changes and is motivated to continue. Established goals for healthier snacks/ light meals to minimize cooking tasks.    Nutritional Diagnosis:  [place]-1.4 Altered GI function As related to GERD.  As evidenced by reflux symptoms controlled with medication. [place]-3.3 Overweight/obesity and [place]-3.4 Unintentional weight gain As related to limited physical activity; unstructured eating pattern.  As evidenced by patient with BMI of 39.85.   Education Materials given:  Designer, industrial/product with food lists, sample meal pattern Sample menus Snacking handout Visit summary with goals/ instructions   Learner/ who was taught:  Patient   Level of understanding: Verbalizes/ demonstrates competency  Demonstrated degree of understanding via:   Teach back Learning barriers: None  Willingness to learn/ readiness for change: Eager, change in progress  Monitoring and Evaluation:  Dietary intake, exercise, and body weight      follow up:  12/21/23 at 11:30am

## 2023-11-08 ENCOUNTER — Ambulatory Visit (INDEPENDENT_AMBULATORY_CARE_PROVIDER_SITE_OTHER): Payer: 59

## 2023-11-08 DIAGNOSIS — E538 Deficiency of other specified B group vitamins: Secondary | ICD-10-CM

## 2023-11-08 DIAGNOSIS — F411 Generalized anxiety disorder: Secondary | ICD-10-CM | POA: Diagnosis not present

## 2023-11-08 MED ORDER — CYANOCOBALAMIN 1000 MCG/ML IJ SOLN
1000.0000 ug | Freq: Once | INTRAMUSCULAR | Status: AC
  Administered 2023-11-08: 1000 ug via INTRAMUSCULAR

## 2023-11-08 NOTE — Progress Notes (Signed)
 Patient presented for B 12 injection to left deltoid, patient voiced no concerns nor showed any signs of distress during injection.

## 2023-11-10 ENCOUNTER — Ambulatory Visit: Admit: 2023-11-10 | Payer: 59 | Admitting: Podiatry

## 2023-11-10 ENCOUNTER — Other Ambulatory Visit: Payer: Self-pay

## 2023-11-10 SURGERY — REPAIR EXTENSOR TENDON WITH METATARSAL OSTEOTOMY AND OPEN REDUCTION INTERNAL FIXATION (ORIF) METATARSAL
Anesthesia: Choice | Laterality: Right

## 2023-11-15 ENCOUNTER — Other Ambulatory Visit: Payer: Self-pay

## 2023-11-15 DIAGNOSIS — F411 Generalized anxiety disorder: Secondary | ICD-10-CM | POA: Diagnosis not present

## 2023-11-15 MED FILL — Gabapentin Cap 300 MG: ORAL | 30 days supply | Qty: 90 | Fill #1 | Status: CN

## 2023-11-16 ENCOUNTER — Other Ambulatory Visit: Payer: 59

## 2023-11-17 DIAGNOSIS — F331 Major depressive disorder, recurrent, moderate: Secondary | ICD-10-CM | POA: Diagnosis not present

## 2023-11-17 DIAGNOSIS — M0609 Rheumatoid arthritis without rheumatoid factor, multiple sites: Secondary | ICD-10-CM | POA: Diagnosis not present

## 2023-11-18 ENCOUNTER — Ambulatory Visit: Payer: 59 | Admitting: Internal Medicine

## 2023-11-25 DIAGNOSIS — F331 Major depressive disorder, recurrent, moderate: Secondary | ICD-10-CM | POA: Diagnosis not present

## 2023-11-27 ENCOUNTER — Other Ambulatory Visit: Payer: Self-pay

## 2023-11-27 ENCOUNTER — Other Ambulatory Visit: Payer: Self-pay | Admitting: Internal Medicine

## 2023-11-27 MED FILL — Gabapentin Cap 300 MG: ORAL | 30 days supply | Qty: 90 | Fill #1 | Status: AC

## 2023-11-28 ENCOUNTER — Other Ambulatory Visit: Payer: Self-pay

## 2023-11-28 MED ORDER — PANTOPRAZOLE SODIUM 40 MG PO TBEC
40.0000 mg | DELAYED_RELEASE_TABLET | Freq: Every day | ORAL | 1 refills | Status: DC
  Filled 2023-11-28: qty 90, 90d supply, fill #0

## 2023-11-30 DIAGNOSIS — F331 Major depressive disorder, recurrent, moderate: Secondary | ICD-10-CM | POA: Diagnosis not present

## 2023-12-01 ENCOUNTER — Other Ambulatory Visit: Payer: Self-pay | Admitting: Internal Medicine

## 2023-12-02 ENCOUNTER — Other Ambulatory Visit: Payer: Self-pay

## 2023-12-02 ENCOUNTER — Other Ambulatory Visit: Payer: Self-pay | Admitting: Internal Medicine

## 2023-12-03 MED FILL — Alprazolam Tab 0.25 MG: ORAL | 30 days supply | Qty: 30 | Fill #0 | Status: AC

## 2023-12-04 ENCOUNTER — Other Ambulatory Visit: Payer: Self-pay

## 2023-12-05 ENCOUNTER — Other Ambulatory Visit: Payer: Self-pay

## 2023-12-06 ENCOUNTER — Ambulatory Visit (INDEPENDENT_AMBULATORY_CARE_PROVIDER_SITE_OTHER): Payer: 59

## 2023-12-06 DIAGNOSIS — F411 Generalized anxiety disorder: Secondary | ICD-10-CM | POA: Diagnosis not present

## 2023-12-06 DIAGNOSIS — E538 Deficiency of other specified B group vitamins: Secondary | ICD-10-CM | POA: Diagnosis not present

## 2023-12-06 MED ORDER — CYANOCOBALAMIN 1000 MCG/ML IJ SOLN
1000.0000 ug | Freq: Once | INTRAMUSCULAR | Status: AC
  Administered 2023-12-06: 1000 ug via INTRAMUSCULAR

## 2023-12-06 NOTE — Progress Notes (Signed)
 Pt received B12 injection in Left  deltoid muscle. Pt tolerated it well with no complaints or concerns.

## 2023-12-08 DIAGNOSIS — F331 Major depressive disorder, recurrent, moderate: Secondary | ICD-10-CM | POA: Diagnosis not present

## 2023-12-09 ENCOUNTER — Other Ambulatory Visit: Payer: Self-pay | Admitting: Internal Medicine

## 2023-12-13 ENCOUNTER — Other Ambulatory Visit: Payer: Self-pay

## 2023-12-13 ENCOUNTER — Other Ambulatory Visit: Payer: Self-pay | Admitting: Internal Medicine

## 2023-12-13 MED FILL — Fluticasone Propionate Nasal Susp 50 MCG/ACT: NASAL | 30 days supply | Qty: 16 | Fill #0 | Status: AC

## 2023-12-14 DIAGNOSIS — F331 Major depressive disorder, recurrent, moderate: Secondary | ICD-10-CM | POA: Diagnosis not present

## 2023-12-15 ENCOUNTER — Ambulatory Visit: Payer: 59 | Admitting: Gastroenterology

## 2023-12-15 ENCOUNTER — Other Ambulatory Visit: Payer: Self-pay

## 2023-12-15 ENCOUNTER — Encounter: Payer: Self-pay | Admitting: Gastroenterology

## 2023-12-15 VITALS — BP 122/77 | HR 73 | Temp 98.5°F | Ht 62.0 in | Wt 216.0 lb

## 2023-12-15 DIAGNOSIS — K219 Gastro-esophageal reflux disease without esophagitis: Secondary | ICD-10-CM

## 2023-12-15 DIAGNOSIS — M0609 Rheumatoid arthritis without rheumatoid factor, multiple sites: Secondary | ICD-10-CM | POA: Diagnosis not present

## 2023-12-15 DIAGNOSIS — Z860100 Personal history of colon polyps, unspecified: Secondary | ICD-10-CM

## 2023-12-15 MED ORDER — SUTAB 1479-225-188 MG PO TABS
ORAL_TABLET | ORAL | 0 refills | Status: DC
  Filled 2023-12-15: qty 24, 1d supply, fill #0

## 2023-12-15 MED ORDER — NA SULFATE-K SULFATE-MG SULF 17.5-3.13-1.6 GM/177ML PO SOLN
354.0000 mL | Freq: Once | ORAL | 0 refills | Status: DC
  Filled 2023-12-15: qty 354, 1d supply, fill #0

## 2023-12-15 NOTE — Progress Notes (Signed)
 Wyline Mood MD, MRCP(U.K) [address]  [address]  [place], [place] [address]  Main: [phone]  Fax: [phone]   Gastroenterology Consultation  Referring Provider:     Dale [place], MD Primary Care Physician:  Dale [place], MD Primary Gastroenterologist:  Dr. Wyline Mood  Reason for Consultation:   GERD        HPI:   Tamara Shah is a 53 y.o. y/o female referred for consultation & management  by Dr. Dale [place], MD.     She has been referred for GERD.  She says that she recently hurt her leg and had decreased mobility gained weight and started developing symptoms of reflux predominantly regurgitation when she lay flat heartburn associated with certain foods was commenced on Protonix 40 mg a day and for Pepcid 20 mg at night.  After starting the medications she has had complete resolution of symptoms.  She also would like to get her anal papula that was evaluated by Dr. Angelena Form back in 2022 resected and she want to schedule her surveillance colonoscopy which she is due for.   Last colonoscopy in 2022 by Dr. Maximino Greenland showed 2 sessile polyps in the transverse colon 5 to 10 mm in size 3 sessile polyps in the sigmoid colon 4 to 5 mm in size and two 5 mm polyps in the ascending colon 4 to 5 mm in size all resected.   The polyps were sessile serrated polyps and tubular adenomas. Past Medical History:  Diagnosis Date   Allergy    Anemia    Anxiety    Arthritis    Blood in stool    H/O   Complication of anesthesia    Depression    GERD (gastroesophageal reflux disease)    History of chicken pox    Hx: UTI (urinary tract infection)    Hyperlipidemia    PONV (postoperative nausea and vomiting)     Past Surgical History:  Procedure Laterality Date   AUGMENTATION MAMMAPLASTY  09/21/1999   CESAREAN SECTION  2007 and 2009   COLONOSCOPY WITH PROPOFOL N/A 06/19/2021   Procedure: COLONOSCOPY WITH PROPOFOL;  Surgeon: Pasty Spillers, MD;  Location: [facility]  ENDOSCOPY;  Service: Endoscopy;  Laterality: N/A;   COSMETIC SURGERY     tummy tuck  09/21/1999    Prior to Admission medications   Medication Sig Start Date End Date Taking? Authorizing Provider  acetaminophen (TYLENOL) 500 MG tablet Take 500 mg by mouth every 6 (six) hours as needed.    [provider]  ALPRAZolam Prudy Feeler) 0.25 MG tablet Take 1 tablet (0.25 mg total) by mouth daily as needed for anxiety. 12/03/23   Dale [place], MD  buPROPion (WELLBUTRIN SR) 150 MG 12 hr tablet Take 1 tablet (150 mg total) by mouth 2 (two) times daily at 8am and 1pm. 09/26/23     Certolizumab Pegol 2 X 200 MG/ML PSKT Inject into the skin every 30 (thirty) days.    [provider]  cetirizine (ZYRTEC) 10 MG tablet Take 1 tablet (10 mg total) by mouth daily. 09/13/22   Eulis Foster, FNP  famotidine (PEPCID) 20 MG tablet Take 20 mg by mouth 2 (two) times daily.    [provider]  ferrous sulfate 325 (65 FE) MG tablet Take 1 tablet (325 mg total) by mouth daily with breakfast. 09/13/22   Worthy Rancher B, FNP  fluticasone (FLONASE) 50 MCG/ACT nasal spray Place 2 sprays into both nostrils daily. 12/13/23   Dale [place],  MD  gabapentin (NEURONTIN) 300 MG capsule Take 3 capsules (900 mg total) by mouth every evening. 10/25/23 10/24/24  Dale [place], MD  hydroxychloroquine (PLAQUENIL) 200 MG tablet Take 1 tablet (200 mg total) by mouth 2 (two) times daily. 07/15/23   Patel, Mayur K, MD  Melatonin 10 MG TABS Take 10 mg by mouth at bedtime.    [provider]  meloxicam (MOBIC) 7.5 MG tablet Take 1 tablet (7.5 mg total) by mouth 2 (two) times daily. 08/05/23     metoprolol succinate (TOPROL XL) 25 MG 24 hr tablet Take 1 tablet (25 mg total) by mouth daily. 06/01/23   Debbe Odea, MD  pantoprazole (PROTONIX) 40 MG tablet Take 1 tablet (40 mg total) by mouth daily. 11/28/23   Dale [place], MD  rosuvastatin (CRESTOR) 10 MG tablet Take 1 tablet (10 mg total) by mouth daily.  09/05/23 09/04/24  Dale [place], MD    Family History  Problem Relation Age of Onset   Healthy Mother    Hypertension Mother    Miscarriages / [place] Mother    Cancer Father        non hogkins lymphoma   Atrial fibrillation Father    Heart disease Father    Hypertension Father    Depression Father    Arthritis Sister    Asthma Sister    Hypertension Sister    Diabetes Sister    Healthy Daughter    Healthy Son    Alzheimer's disease Maternal Grandmother    Healthy Sister    Arthritis Maternal Grandfather    Colon cancer Paternal Uncle 44   Breast cancer Neg Hx      Social History   Tobacco Use   Smoking status: Never   Smokeless tobacco: Never  Vaping Use   Vaping status: Never Used  Substance Use Topics   Alcohol use: Yes    Alcohol/week: 5.0 standard drinks of alcohol    Types: 5 Cans of beer per week    Comment: occasional   Drug use: No    Allergies as of 12/15/2023   (No Known Allergies)    Review of Systems:    All systems reviewed and negative except where noted in HPI.   Physical Exam:  BP 122/77   Pulse 73   Temp 98.5 F (36.9 C)   Ht 5\' 2"  (1.575 m)   Wt 216 lb (98 kg)   BMI 39.51 kg/m  No LMP recorded. Patient is perimenopausal. Psych:  Alert and cooperative. Normal mood and affect. General:   Alert,  Well-developed, well-nourished, pleasant and cooperative in NAD Head:  Normocephalic and atraumatic. Eyes:  Sclera clear, no icterus.   Conjunctiva pink. Abdomen:  Normal bowel sounds.  No bruits.  Soft, non-tender and non-distended without masses, hepatosplenomegaly or hernias noted.  No guarding or rebound tenderness.    Neurologic:  Alert and oriented x3;  grossly normal neurologically. Psych:  Alert and cooperative. Normal mood and affect.  Imaging Studies: No results found.  Assessment and Plan:   Tamara Shah is a 53 y.o. y/o female has been referred for GERD well-controlled on PPI and Pepcid.  Very likely recent onset of  symptoms due to weight gain..  Personal history of colon polyps due for surveillance colonoscopy  Plan   GERD : Counseled on life style changes, suggest to use PPI first thing in the morning on empty stomach and eat 30 minutes after. Advised on the use of a wedge pillow at night , avoid  meals for 2 hours prior to bed time. Weight loss .Discussed the risks and benefits of long term PPI use including but not limited to bone loss, chronic kidney disease, infections , low magnesium . Aim to use at the lowest dose for the shortest period of time she will reduce her Protonix to 20 mg once a day and continue Pepcid at night and in 6 weeks if doing well we will just stay on Pepcid 20 mg at night and 6 weeks subsequently will go off the Pepcid if she can.  If she has symptoms or recurrence will start adding back the medications in the same order in reverse.  Explained to her the aim is to stay on the least amount of medication and the safest medication. Colonoscopy for personal history of colon polyps and subsequently she needs to be referred to Dr. Angelena Form if no abnormality seen on the colonoscopy  I have discussed alternative options, risks & benefits,  which include, but are not limited to, bleeding, infection, perforation,respiratory complication & drug reaction.  The patient agrees with this plan & written consent will be obtained.     Follow up in 6 months  Dr Wyline Mood MD,MRCP(U.K)

## 2023-12-15 NOTE — Patient Instructions (Addendum)
 Please purchase a wedge pillow to help you with your acid reflux.    GERD in Adults: Diet Changes When you have gastroesophageal reflux disease (GERD), you may need to make changes to your diet. Choosing the right foods can help with your symptoms. Think about working with an expert in healthy eating called a dietitian. They can help you make healthy food choices. What are tips for following this plan? Reading food labels Look for foods that are low in saturated fat. Foods that may help with your symptoms include: Foods with less than 5% of daily value (DV) of fat. Foods with 0 grams of trans fat. Cooking [employer] in ways that don't use a lot of fat. These ways include: Baking. Steaming. Grilling. Broiling. To add flavor, try to use herbs that are low in spice and acidity. Avoid frying your food. Meal planning  Eat small meals often rather than eating 3 large meals each day. Eat your meals slowly in a place where you feel relaxed. If told by your health care provider, avoid: Foods that cause symptoms. Keep a food diary to keep track of foods that cause symptoms. Alcohol. Drinking a lot of liquid with meals. General instructions For 2-3 hours after you eat, avoid: Bending over. Exercise. Lying down. Chew sugar-free gum after meals. What foods should I eat? Eat a healthy diet. Try to include: Foods with high amounts of fiber. These include: Fruits and vegetables. Whole grains and beans. Low-fat dairy products. Lean meats, fish, and poultry. Egg whites. Foods that cause symptoms in someone else may not cause symptoms for you. Work with your provider to find foods that are safe for you. The items listed above may not be all the foods and drinks you can have. Talk with a dietitian to learn more. The items listed above may not be a complete list of foods and beverages you can eat and drink. Contact a dietitian for more information. What foods should I avoid? Limiting some  of these foods may help with your symptoms. Each person is different. Talk with a dietitian or your provider to help you find the exact foods to avoid. Some of the foods to avoid may include: Fruits Fruits with a lot of acid in them. These may include citrus fruits, such as oranges, grapefruit, pineapple, and lemons. Vegetables Deep-fried vegetables, such as [place] fries. Vegetables, sauces, or toppings made with added fat and vegetables with acid in them. These may include tomatoes and tomato products, chili peppers, onions, garlic, and horseradish. Grains Pastries or quick breads with added fat. Meats and other proteins High-fat meats, such as fatty beef or pork, hot dogs, ribs, ham, sausage, salami, and bacon. Fried meat or protein, such as fried fish and fried chicken. Egg yolks. Fats and oils Butter. Margarine. Shortening. Ghee. Drinks Coffee and other drinks with caffeine in them. Fizzy and sugary drinks, such as soda and energy drinks. Fruit juice made with acidic fruits, such as orange or grapefruit. Tomato juice. Sweets and desserts Chocolate and cocoa. Donuts. Seasonings and condiments Mint, such as peppermint and spearmint. Condiments, herbs, or seasonings that cause symptoms. These may include curry, hot sauce, or vinegar-based salad dressings. The items listed above may not be all the foods and drinks you should avoid. Talk with a dietitian to learn more. Questions to ask your health care provider Changes to your diet and everyday life are often the first steps taken to manage symptoms of GERD. If these changes don't help, talk with  your provider about taking medicines. Where to find more information International Foundation for Gastrointestinal Disorders: aboutgerd.org This information is not intended to replace advice given to you by your health care provider. Make sure you discuss any questions you have with your health care provider. Document Revised: 07/19/2023 Document  Reviewed: 02/02/2023 Elsevier Patient Education  2024 [employer].

## 2023-12-15 NOTE — Addendum Note (Signed)
 Addended by: Adela Ports on: 12/15/2023 04:36 PM   Modules accepted: Orders

## 2023-12-19 ENCOUNTER — Other Ambulatory Visit: Payer: Self-pay | Admitting: Internal Medicine

## 2023-12-19 ENCOUNTER — Other Ambulatory Visit: Payer: Self-pay

## 2023-12-19 MED ORDER — MELOXICAM 7.5 MG PO TABS
7.5000 mg | ORAL_TABLET | Freq: Two times a day (BID) | ORAL | 3 refills | Status: DC
  Filled 2023-12-19: qty 60, 30d supply, fill #0
  Filled 2024-01-09 – 2024-01-19 (×2): qty 60, 30d supply, fill #1
  Filled 2024-02-09 – 2024-02-14 (×2): qty 60, 30d supply, fill #2
  Filled 2024-03-20: qty 60, 30d supply, fill #3

## 2023-12-19 MED FILL — Gabapentin Cap 300 MG: ORAL | 30 days supply | Qty: 90 | Fill #0 | Status: CN

## 2023-12-20 DIAGNOSIS — F411 Generalized anxiety disorder: Secondary | ICD-10-CM | POA: Diagnosis not present

## 2023-12-21 ENCOUNTER — Ambulatory Visit: Payer: 59 | Admitting: Dietician

## 2023-12-26 ENCOUNTER — Ambulatory Visit: Payer: 59 | Admitting: Internal Medicine

## 2023-12-26 ENCOUNTER — Other Ambulatory Visit: Payer: Self-pay

## 2023-12-26 ENCOUNTER — Encounter: Payer: Self-pay | Admitting: Internal Medicine

## 2023-12-26 VITALS — BP 126/74 | HR 75 | Temp 98.0°F | Resp 16 | Ht 62.0 in | Wt 218.6 lb

## 2023-12-26 DIAGNOSIS — D473 Essential (hemorrhagic) thrombocythemia: Secondary | ICD-10-CM | POA: Diagnosis not present

## 2023-12-26 DIAGNOSIS — Z1211 Encounter for screening for malignant neoplasm of colon: Secondary | ICD-10-CM

## 2023-12-26 DIAGNOSIS — K219 Gastro-esophageal reflux disease without esophagitis: Secondary | ICD-10-CM

## 2023-12-26 DIAGNOSIS — F439 Reaction to severe stress, unspecified: Secondary | ICD-10-CM

## 2023-12-26 DIAGNOSIS — E538 Deficiency of other specified B group vitamins: Secondary | ICD-10-CM | POA: Diagnosis not present

## 2023-12-26 DIAGNOSIS — E7800 Pure hypercholesterolemia, unspecified: Secondary | ICD-10-CM | POA: Diagnosis not present

## 2023-12-26 DIAGNOSIS — M069 Rheumatoid arthritis, unspecified: Secondary | ICD-10-CM

## 2023-12-26 DIAGNOSIS — I471 Supraventricular tachycardia, unspecified: Secondary | ICD-10-CM

## 2023-12-26 MED ORDER — CYANOCOBALAMIN 1000 MCG/ML IJ SOLN
1000.0000 ug | INTRAMUSCULAR | 0 refills | Status: DC
  Filled 2023-12-26: qty 3, 90d supply, fill #0
  Filled 2024-03-19: qty 3, 90d supply, fill #1

## 2023-12-26 MED ORDER — PANTOPRAZOLE SODIUM 20 MG PO TBEC
20.0000 mg | DELAYED_RELEASE_TABLET | Freq: Every day | ORAL | 1 refills | Status: DC
  Filled 2023-12-26: qty 90, 90d supply, fill #0
  Filled 2024-04-10: qty 90, 90d supply, fill #1

## 2023-12-26 MED ORDER — SYRINGE 25G X 1" 3 ML MISC
0 refills | Status: DC
  Filled 2023-12-26 – 2023-12-30 (×2): qty 50, fill #0

## 2023-12-26 NOTE — Progress Notes (Signed)
 Subjective:    [name], female    DOB: October 16, 1970, 53 y.o.   MRN: [id number]  Patient here for  Chief Complaint  Patient presents with   Medical Management of Chronic Issues    HPI Here for a scheduled follow up - follow up regarding increased stress, hypercholesterolemia and RA. Saw Dr patel 05/06/23 - taking plaquenil and cimzia. Saw podiatry 10/26/23 - f/u right fifth metatarsal fracture - date of injury 07/26/23. Xray revealed previous fracture to be healed. Transition to support shoes. Last visit, noticed episodes of dizziness and increased sweats. Only occurred in the evening. See last note for details. Discussed low sugar, varying blood pressure, etc. She is doing better. No significant problem now. Saw Dr Antony Baumgartner 12/15/23. Planning for colonoscopy. Tapering PPI. Discussed taking protonix 20mg  in am and pepcid in the evening. She is seeing her therapist every other week and couples therapy every other week. Has been stable on her current medication regimen and would like for me to start prescribing her wellbutrin. Will continue to see the therapist. She has a lot of appts and would like to cut down some. Also, would like to start getting her B12 injections at home.    Past Medical History:  Diagnosis Date   Allergy    Anemia    Anxiety    Arthritis    Blood in stool    H/O   Complication of anesthesia    Depression    GERD (gastroesophageal reflux disease)    History of chicken pox    Hx: UTI (urinary tract infection)    Hyperlipidemia    PONV (postoperative nausea and vomiting)    Past Surgical History:  Procedure Laterality Date   AUGMENTATION MAMMAPLASTY  09/21/1999   CESAREAN SECTION  2007 and 2009   COLONOSCOPY WITH PROPOFOL N/A 06/19/2021   Procedure: COLONOSCOPY WITH PROPOFOL;  Surgeon: Irby Mannan, MD;  Location: [facility] ENDOSCOPY;  Service: Endoscopy;  Laterality: N/A;   COSMETIC SURGERY     tummy tuck  09/21/1999   Family History  Problem  Relation Age of Onset   Healthy Mother    Hypertension Mother    Miscarriages / [place] Mother    Cancer Father        non hogkins lymphoma   Atrial fibrillation Father    Heart disease Father    Hypertension Father    Depression Father    Arthritis Sister    Asthma Sister    Hypertension Sister    Diabetes Sister    Healthy Daughter    Healthy Son    Alzheimer's disease Maternal Grandmother    Healthy Sister    Arthritis Maternal Grandfather    Colon cancer Paternal Uncle 29   Breast cancer Neg Hx    Social History   Socioeconomic History   Marital status: Married    Spouse name: Not on file   Number of children: Not on file   Years of education: Not on file   Highest education level: Associate degree: academic program  Occupational History   Not on file  Tobacco Use   Smoking status: Never   Smokeless tobacco: Never  Vaping Use   Vaping status: Never Used  Substance and Sexual Activity   Alcohol use: Yes    Alcohol/week: 5.0 standard drinks of alcohol    Types: 5 Cans of beer per week    Comment: occasional   Drug use: No   Sexual activity: Yes  Birth control/protection: None    Comment: Husband had a vasectomy  Other Topics Concern   Not on file  Social History Narrative   Not on file   Social Drivers of Health   Financial Resource Strain: Low Risk  (07/19/2023)   Overall Financial Resource Strain (CARDIA)    Difficulty of Paying Living Expenses: Not hard at all  Food Insecurity: No Food Insecurity (07/19/2023)   Hunger Vital Sign    Worried About Running Out of Food in the Last Year: Never true    Ran Out of Food in the Last Year: Never true  Transportation Needs: No Transportation Needs (07/19/2023)   PRAPARE - Administrator, Civil Service (Medical): No    Lack of Transportation (Non-Medical): No  Physical Activity: Unknown (07/19/2023)   Exercise Vital Sign    Days of Exercise per Week: 0 days    Minutes of Exercise per  Session: Not on file  Stress: Stress Concern Present (07/19/2023)   [employer] of Occupational Health - Occupational Stress Questionnaire    Feeling of Stress : Rather much  Social Connections: Socially Integrated (07/19/2023)   Social Connection and Isolation Panel [NHANES]    Frequency of Communication with Friends and Family: More than three times a week    Frequency of Social Gatherings with Friends and Family: Once a week    Attends Religious Services: More than 4 times per year    Active Member of [employer] or Organizations: Yes    Attends Engineer, structural: More than 4 times per year    Marital Status: Married     Review of Systems  Constitutional:  Negative for appetite change and unexpected weight change.  HENT:  Negative for congestion and sinus pressure.   Respiratory:  Negative for cough, chest tightness and shortness of breath.   Cardiovascular:  Negative for chest pain, palpitations and leg swelling.  Gastrointestinal:  Negative for abdominal pain, diarrhea, nausea and vomiting.  Genitourinary:  Negative for difficulty urinating and dysuria.  Musculoskeletal:        Seeing Dr Lydia Sams for her joint issues. Appear to be stable.   Skin:  Negative for color change and rash.  Neurological:  Negative for dizziness and headaches.  Psychiatric/Behavioral:  Negative for agitation and dysphoric mood.        Seeing her therapist. Overall appears to be doing better.        Objective:     BP 126/74   Pulse 75   Temp 98 F (36.7 C)   Resp 16   Ht 5\' 2"  (1.575 m)   Wt 218 lb 9.6 oz (99.2 kg)   SpO2 98%   BMI 39.98 kg/m  Wt Readings from Last 3 Encounters:  12/26/23 218 lb 9.6 oz (99.2 kg)  12/15/23 216 lb (98 kg)  11/07/23 217 lb 14.4 oz (98.8 kg)    Physical Exam Vitals reviewed.  Constitutional:      General: She is not in acute distress.    Appearance: Normal appearance.  HENT:     Head: Normocephalic and atraumatic.     Right Ear: External ear  normal.     Left Ear: External ear normal.     Mouth/Throat:     Pharynx: No oropharyngeal exudate or posterior oropharyngeal erythema.  Eyes:     General: No scleral icterus.       Right eye: No discharge.        Left eye: No discharge.  Conjunctiva/sclera: Conjunctivae normal.  Neck:     Thyroid: No thyromegaly.  Cardiovascular:     Rate and Rhythm: Normal rate and regular rhythm.  Pulmonary:     Effort: No respiratory distress.     Breath sounds: Normal breath sounds. No wheezing.  Abdominal:     General: Bowel sounds are normal.     Palpations: Abdomen is soft.     Tenderness: There is no abdominal tenderness.  Musculoskeletal:        General: No swelling or tenderness.     Cervical back: Neck supple. No tenderness.  Lymphadenopathy:     Cervical: No cervical adenopathy.  Skin:    Findings: No erythema or rash.  Neurological:     Mental Status: She is alert.  Psychiatric:        Mood and Affect: Mood normal.        Behavior: Behavior normal.         Outpatient Encounter Medications as of 12/26/2023  Medication Sig   cyanocobalamin (VITAMIN B12) 1000 MCG/ML injection Inject 1 mL (1,000 mcg total) into the muscle every 30 (thirty) days.   pantoprazole (PROTONIX) 20 MG tablet Take 1 tablet (20 mg total) by mouth daily.   Syringe/Needle, Disp, (SYRINGE 3CC/25GX1") 25G X 1" 3 ML MISC Use to give b12 injections   acetaminophen (TYLENOL) 500 MG tablet Take 500 mg by mouth every 6 (six) hours as needed.   ALPRAZolam (XANAX) 0.25 MG tablet Take 1 tablet (0.25 mg total) by mouth daily as needed for anxiety.   buPROPion (WELLBUTRIN SR) 150 MG 12 hr tablet Take 1 tablet (150 mg total) by mouth 2 (two) times daily at 8am and 1pm.   Certolizumab Pegol 2 X 200 MG/ML PSKT Inject into the skin every 30 (thirty) days.   cetirizine (ZYRTEC) 10 MG tablet Take 1 tablet (10 mg total) by mouth daily.   famotidine (PEPCID) 20 MG tablet Take 20 mg by mouth 2 (two) times daily.   ferrous  sulfate 325 (65 FE) MG tablet Take 1 tablet (325 mg total) by mouth daily with breakfast.   fluticasone (FLONASE) 50 MCG/ACT nasal spray Place 2 sprays into both nostrils daily.   gabapentin (NEURONTIN) 300 MG capsule Take 3 capsules (900 mg total) by mouth every evening.   hydroxychloroquine (PLAQUENIL) 200 MG tablet Take 1 tablet (200 mg total) by mouth 2 (two) times daily.   Melatonin 10 MG TABS Take 10 mg by mouth at bedtime.   meloxicam (MOBIC) 7.5 MG tablet Take 1 tablet (7.5 mg total) by mouth 2 (two) times daily.   metoprolol succinate (TOPROL XL) 25 MG 24 hr tablet Take 1 tablet (25 mg total) by mouth daily.   rosuvastatin (CRESTOR) 10 MG tablet Take 1 tablet (10 mg total) by mouth daily.   Sodium Sulfate-Mag Sulfate-KCl (SUTAB) [phone] MG TABS At 5 PM take 12 tablets using the 8 oz cup provided in the kit drinking 5 cups of water and repeat the same process 5 hours before your procedure.   [DISCONTINUED] pantoprazole (PROTONIX) 40 MG tablet Take 1 tablet (40 mg total) by mouth daily.   No facility-administered encounter medications on file as of 12/26/2023.     Lab Results  Component Value Date   WBC 6.5 10/29/2022   HGB 13.2 10/29/2022   HCT 39.4 10/29/2022   PLT 271.0 10/29/2022   GLUCOSE 88 10/26/2023   CHOL 142 10/26/2023   TRIG 117.0 10/26/2023   HDL 47.90 10/26/2023   LDLDIRECT 87.0  04/03/2019   LDLCALC 71 10/26/2023   ALT 21 10/26/2023   AST 20 10/26/2023   NA 142 10/26/2023   K 3.6 10/26/2023   CL 107 10/26/2023   CREATININE 0.81 10/26/2023   BUN 12 10/26/2023   CO2 28 10/26/2023   TSH 1.92 10/26/2023    MM 3D SCREENING MAMMOGRAM BILATERAL BREAST W/IMPLANT Result Date: 11/03/2023 CLINICAL DATA:  Screening. EXAM: DIGITAL SCREENING BILATERAL MAMMOGRAM WITH IMPLANTS, CAD AND TOMOSYNTHESIS TECHNIQUE: Bilateral screening digital craniocaudal and mediolateral oblique mammograms were obtained. Bilateral screening digital breast tomosynthesis was performed. The  images were evaluated with computer-aided detection. Standard and/or implant displaced views were performed. COMPARISON:  Previous exam(s). ACR Breast Density Category b: There are scattered areas of fibroglandular density. FINDINGS: The patient has retroglandular implants. There are no findings suspicious for malignancy. IMPRESSION: No mammographic evidence of malignancy. A result letter of this screening mammogram will be mailed directly to the patient. RECOMMENDATION: Screening mammogram in one year. (Code:SM-B-01Y) BI-RADS CATEGORY  1: Negative. Electronically Signed   By: Alinda Apley M.D.   On: 11/03/2023 07:34       Assessment & Plan:  Colon cancer screening Assessment & Plan: Colonoscopy 06/19/21 - multiple polyps. Recommended f/u colonoscopy in 3 years. Due this year.    Essential (hemorrhagic) thrombocythemia (HCC) Assessment & Plan: Follow cbc. Last cbc wnl.    Gastroesophageal reflux disease, unspecified whether esophagitis present Assessment & Plan: Upper symptoms controlled. Discussed taking protonix 20mg  in am and pepcid in the pm. Follow. Will continue to decrease dosing if remains without symptoms. Call with update.    Hypercholesterolemia Assessment & Plan: Continue crestor.  Low cholesterol diet and exercise.  Follow lipid panel and liver function tests.  No changes today.  Lab Results  Component Value Date   CHOL 142 10/26/2023   HDL 47.90 10/26/2023   LDLCALC 71 10/26/2023   LDLDIRECT 87.0 04/03/2019   TRIG 117.0 10/26/2023   CHOLHDL 3 10/26/2023      Paroxysmal SVT (supraventricular tachycardia) (HCC) Assessment & Plan: Continue toprol. Stable.    Rheumatoid arthritis, involving unspecified site, unspecified whether rheumatoid factor present [facility]) Assessment & Plan: Seeing Dr Lydia Sams.   On cimzia and plaquenil.  Overall has done relatively well on this medication.  Continue f/u with rheumatology. With some increased arthritis issues recently,  previously requested referral to a nutritionist to discuss diet. Saw the nutritionist. Continue exercise/stretches. Follow.    Stress Assessment & Plan: Increased stress. On wellbutrin.  Overall appears to be doing better.  Given stable on current medication, would like to start getting her prescription through me. Will continue to follow up with her therapist. Follow    B12 deficiency Assessment & Plan: Receiving B12 injections. Would like to start getting these at home. Will call in rx for B12 to be administer at home. She will bring with her to her next schedule nurse visit for instruction how to self administer her B12 injections.   Orders: -     CBC with Differential/Platelet; Future -     Vitamin B12; Future  Other orders -     Pantoprazole Sodium; Take 1 tablet (20 mg total) by mouth daily.  Dispense: 90 tablet; Refill: 1 -     Cyanocobalamin; Inject 1 mL (1,000 mcg total) into the muscle every 30 (thirty) days.  Dispense: 10 mL; Refill: 0 -     Syringe; Use to give b12 injections  Dispense: 50 each; Refill: 0     Dellar Fenton, MD

## 2023-12-28 DIAGNOSIS — F331 Major depressive disorder, recurrent, moderate: Secondary | ICD-10-CM | POA: Diagnosis not present

## 2023-12-30 ENCOUNTER — Other Ambulatory Visit: Payer: Self-pay

## 2023-12-30 MED FILL — Gabapentin Cap 300 MG: ORAL | 30 days supply | Qty: 90 | Fill #0 | Status: AC

## 2024-01-01 ENCOUNTER — Encounter: Payer: Self-pay | Admitting: Internal Medicine

## 2024-01-01 DIAGNOSIS — E538 Deficiency of other specified B group vitamins: Secondary | ICD-10-CM | POA: Insufficient documentation

## 2024-01-01 NOTE — Assessment & Plan Note (Signed)
 Colonoscopy 06/19/21 - multiple polyps. Recommended f/u colonoscopy in 3 years. Due this year.

## 2024-01-01 NOTE — Assessment & Plan Note (Signed)
Continue toprol.  Stable.  

## 2024-01-01 NOTE — Assessment & Plan Note (Signed)
 Continue crestor.  Low cholesterol diet and exercise.  Follow lipid panel and liver function tests.  No changes today.  Lab Results  Component Value Date   CHOL 142 10/26/2023   HDL 47.90 10/26/2023   LDLCALC 71 10/26/2023   LDLDIRECT 87.0 04/03/2019   TRIG 117.0 10/26/2023   CHOLHDL 3 10/26/2023

## 2024-01-01 NOTE — Assessment & Plan Note (Signed)
 Seeing Dr Lydia Sams.   On cimzia and plaquenil.  Overall has done relatively well on this medication.  Continue f/u with rheumatology. With some increased arthritis issues recently, previously requested referral to a nutritionist to discuss diet. Saw the nutritionist. Continue exercise/stretches. Follow.

## 2024-01-01 NOTE — Assessment & Plan Note (Signed)
 Receiving B12 injections. Would like to start getting these at home. Will call in rx for B12 to be administer at home. She will bring with her to her next schedule nurse visit for instruction how to self administer her B12 injections.

## 2024-01-01 NOTE — Assessment & Plan Note (Signed)
 Upper symptoms controlled. Discussed taking protonix 20mg  in am and pepcid in the pm. Follow. Will continue to decrease dosing if remains without symptoms. Call with update.

## 2024-01-01 NOTE — Assessment & Plan Note (Signed)
 Increased stress. On wellbutrin.  Overall appears to be doing better.  Given stable on current medication, would like to start getting her prescription through me. Will continue to follow up with her therapist. Follow

## 2024-01-01 NOTE — Assessment & Plan Note (Signed)
 Follow cbc. Last cbc wnl.

## 2024-01-03 DIAGNOSIS — F411 Generalized anxiety disorder: Secondary | ICD-10-CM | POA: Diagnosis not present

## 2024-01-04 ENCOUNTER — Ambulatory Visit: Admitting: Anesthesiology

## 2024-01-04 ENCOUNTER — Encounter
Admission: RE | Disposition: A | Discharge status: Home / Self Care | Payer: Self-pay | Source: Home / Self Care | Attending: Gastroenterology

## 2024-01-04 ENCOUNTER — Ambulatory Visit
Admission: RE | Admit: 2024-01-04 | Discharge: 2024-01-04 | Disposition: A | Discharge status: Home / Self Care | Attending: Gastroenterology | Admitting: Gastroenterology

## 2024-01-04 ENCOUNTER — Other Ambulatory Visit: Payer: Self-pay

## 2024-01-04 ENCOUNTER — Encounter: Payer: Self-pay | Admitting: Gastroenterology

## 2024-01-04 DIAGNOSIS — D128 Benign neoplasm of rectum: Secondary | ICD-10-CM | POA: Diagnosis not present

## 2024-01-04 DIAGNOSIS — F419 Anxiety disorder, unspecified: Secondary | ICD-10-CM | POA: Diagnosis not present

## 2024-01-04 DIAGNOSIS — Z1211 Encounter for screening for malignant neoplasm of colon: Secondary | ICD-10-CM | POA: Insufficient documentation

## 2024-01-04 DIAGNOSIS — K621 Rectal polyp: Secondary | ICD-10-CM | POA: Diagnosis not present

## 2024-01-04 DIAGNOSIS — D123 Benign neoplasm of transverse colon: Secondary | ICD-10-CM | POA: Diagnosis not present

## 2024-01-04 DIAGNOSIS — K644 Residual hemorrhoidal skin tags: Secondary | ICD-10-CM | POA: Diagnosis not present

## 2024-01-04 DIAGNOSIS — R000 Tachycardia, unspecified: Secondary | ICD-10-CM | POA: Insufficient documentation

## 2024-01-04 DIAGNOSIS — K6389 Other specified diseases of intestine: Secondary | ICD-10-CM | POA: Diagnosis not present

## 2024-01-04 DIAGNOSIS — K219 Gastro-esophageal reflux disease without esophagitis: Secondary | ICD-10-CM | POA: Diagnosis not present

## 2024-01-04 DIAGNOSIS — D121 Benign neoplasm of appendix: Secondary | ICD-10-CM | POA: Insufficient documentation

## 2024-01-04 DIAGNOSIS — K635 Polyp of colon: Secondary | ICD-10-CM | POA: Diagnosis not present

## 2024-01-04 DIAGNOSIS — Z860100 Personal history of colon polyps, unspecified: Secondary | ICD-10-CM

## 2024-01-04 HISTORY — PX: COLONOSCOPY: SHX5424

## 2024-01-04 SURGERY — COLONOSCOPY
Anesthesia: General

## 2024-01-04 MED ORDER — PROPOFOL 10 MG/ML IV BOLUS
INTRAVENOUS | Status: DC | PRN
  Administered 2024-01-04: 30 mg via INTRAVENOUS
  Administered 2024-01-04: 20 mg via INTRAVENOUS
  Administered 2024-01-04: 50 mg via INTRAVENOUS

## 2024-01-04 MED ORDER — LIDOCAINE HCL (CARDIAC) PF 100 MG/5ML IV SOSY
PREFILLED_SYRINGE | INTRAVENOUS | Status: DC | PRN
  Administered 2024-01-04: 80 mg via INTRAVENOUS

## 2024-01-04 MED ORDER — SODIUM CHLORIDE 0.9 % IV SOLN
INTRAVENOUS | Status: DC
Start: 2024-01-04 — End: 2024-01-04

## 2024-01-04 MED ORDER — DEXMEDETOMIDINE HCL IN NACL 80 MCG/20ML IV SOLN
INTRAVENOUS | Status: DC | PRN
  Administered 2024-01-04: 20 ug via INTRAVENOUS

## 2024-01-04 MED ORDER — PROPOFOL 500 MG/50ML IV EMUL
INTRAVENOUS | Status: DC | PRN
  Administered 2024-01-04: 75 ug/kg/min via INTRAVENOUS

## 2024-01-04 NOTE — Op Note (Signed)
 [facility] Gastroenterology Patient Name: Tamara Shah Procedure Date: 01/04/2024 9:44 AM MRN: [id number] Account #: [id number] Date of Birth: [birth year] Admit Type: Outpatient Age: 53 Room: [facility] ENDO ROOM 3 Gender: Female Note Status: Finalized Instrument Name: Prentice Docker [id number] Procedure:             Colonoscopy Indications:           Surveillance: Personal history of adenomatous polyps                         on last colonoscopy > 3 years ago, Last colonoscopy:                         September 2022 Providers:             Wyline Mood MD, MD Medicines:             Monitored Anesthesia Care Complications:         No immediate complications. Procedure:             Pre-Anesthesia Assessment:                        - Prior to the procedure, a History and Physical was                         performed, and patient medications, allergies and                         sensitivities were reviewed. The patient's tolerance                         of previous anesthesia was reviewed.                        - The risks and benefits of the procedure and the                         sedation options and risks were discussed with the                         patient. All questions were answered and informed                         consent was obtained.                        - ASA Grade Assessment: II - A patient with mild                         systemic disease.                        After obtaining informed consent, the colonoscope was                         passed under direct vision. Throughout the procedure,                         the patient's blood pressure, pulse, and oxygen  saturations were monitored continuously. The                         Colonoscope was introduced through the anus and                         advanced to the the cecum, identified by the                         appendiceal orifice. The colonoscopy was performed                          with ease. The patient tolerated the procedure well.                         The quality of the bowel preparation was excellent.                         The ileocecal valve, appendiceal orifice, and rectum                         were photographed. Findings:      The perianal and digital rectal examinations were normal.      Three sessile polyps were found in the rectum, descending colon and       transverse colon. The polyps were 4 to 5 mm in size. These polyps were       removed with a cold snare. Resection and retrieval were complete.      A 7 mm polyp was found in the appendiceal orifice. The polyp was       sessile. The polyp was removed with a cold snare. Resection and       retrieval were complete. To prevent bleeding post-intervention, one       hemostatic clip was successfully placed. Clip manufacturer: [employer]. There was no bleeding at the end of the procedure.      The exam was otherwise without abnormality on direct and retroflexion       views.      Skin tags were found on perianal exam. Impression:            - Three 4 to 5 mm polyps in the rectum, in the                         descending colon and in the transverse colon, removed                         with a cold snare. Resected and retrieved.                        - One 7 mm polyp at the appendiceal orifice, removed                         with a cold snare. Resected and retrieved. Clip was                         placed. Clip manufacturer: [employer].                        -  The examination was otherwise normal on direct and                         retroflexion views. Recommendation:        - Discharge patient to home (with escort).                        - Resume previous diet.                        - Continue present medications.                        - Await pathology results.                        - Repeat colonoscopy in 3 - 5 years for surveillance                          based on pathology results. Procedure Code(s):     --- Professional ---                        [phone], Colonoscopy, flexible; with removal of                         tumor(s), polyp(s), or other lesion(s) by snare                         technique Diagnosis Code(s):     --- Professional ---                        Z86.010, Personal history of colonic polyps                        D12.8, Benign neoplasm of rectum                        D12.4, Benign neoplasm of descending colon                        D12.3, Benign neoplasm of transverse colon (hepatic                         flexure or splenic flexure)                        D12.1, Benign neoplasm of appendix CPT copyright 2022 American Medical Association. All rights reserved. The codes documented in this report are preliminary and upon coder review may  be revised to meet current compliance requirements. Wyline Mood, MD Wyline Mood MD, MD 01/04/2024 10:05:26 AM This report has been signed electronically. Number of Addenda: 0 Note Initiated On: 01/04/2024 9:44 AM Scope Withdrawal Time: 0 hours 12 minutes 47 seconds  Total Procedure Duration: 0 hours 14 minutes 58 seconds  Estimated Blood Loss:  Estimated blood loss: none.      [facility]

## 2024-01-04 NOTE — Anesthesia Preprocedure Evaluation (Addendum)
 Anesthesia Evaluation  Patient identified by MRN, date of birth, ID band Patient awake    Reviewed: Allergy & Precautions, H&P , NPO status , Patient's Chart, lab work & pertinent test results  History of Anesthesia Complications (+) PONV and history of anesthetic complications  Airway Mallampati: II  TM Distance: >3 FB Neck ROM: full    Dental no notable dental hx.    Pulmonary neg pulmonary ROS   Pulmonary exam normal        Cardiovascular Normal cardiovascular exam+ dysrhythmias (tachycardia)      Neuro/Psych  PSYCHIATRIC DISORDERS Anxiety     negative neurological ROS     GI/Hepatic Neg liver ROS,GERD  Controlled,,  Endo/Other  negative endocrine ROS    Renal/GU negative Renal ROS  negative genitourinary   Musculoskeletal   Abdominal  (+) + obese  Peds  Hematology negative hematology ROS (+)   Anesthesia Other Findings Past Medical History: No date: Allergy No date: Anemia No date: Anxiety No date: Arthritis No date: Blood in stool     Comment:  H/O No date: Complication of anesthesia No date: Depression No date: GERD (gastroesophageal reflux disease) No date: History of chicken pox No date: Hx: UTI (urinary tract infection) No date: Hyperlipidemia No date: PONV (postoperative nausea and vomiting)  Past Surgical History: 09/21/1999: AUGMENTATION MAMMAPLASTY 2007 and 2009: CESAREAN SECTION 06/19/2021: COLONOSCOPY WITH PROPOFOL; N/A     Comment:  Procedure: COLONOSCOPY WITH PROPOFOL;  Surgeon:               Irby Mannan, MD;  Location: [facility] ENDOSCOPY;                Service: Endoscopy;  Laterality: N/A; No date: COSMETIC SURGERY 09/21/1999: tummy tuck     Reproductive/Obstetrics negative OB ROS                             Anesthesia Physical Anesthesia Plan  ASA: 2  Anesthesia Plan: General   Post-op Pain Management:    Induction: Intravenous  PONV  Risk Score and Plan: Propofol infusion and TIVA  Airway Management Planned: Natural Airway  Additional Equipment:   Intra-op Plan:   Post-operative Plan:   Informed Consent: I have reviewed the patients History and Physical, chart, labs and discussed the procedure including the risks, benefits and alternatives for the proposed anesthesia with the patient or authorized representative who has indicated his/her understanding and acceptance.     Dental Advisory Given  Plan Discussed with: CRNA and Surgeon  Anesthesia Plan Comments:         Anesthesia Quick Evaluation

## 2024-01-04 NOTE — H&P (Signed)
 Wyline Mood, MD [address], [place], [place], [address] [address], [address], [place], [place], [address] Phone: [phone]  Fax: [phone]  Primary Care Physician:  Dale Beresford, MD   Pre-Procedure History & Physical: HPI:  Tamara Shah is a 53 y.o. female is here for an colonoscopy.   Past Medical History:  Diagnosis Date   Allergy    Anemia    Anxiety    Arthritis    Blood in stool    H/O   Complication of anesthesia    Depression    GERD (gastroesophageal reflux disease)    History of chicken pox    Hx: UTI (urinary tract infection)    Hyperlipidemia    PONV (postoperative nausea and vomiting)     Past Surgical History:  Procedure Laterality Date   AUGMENTATION MAMMAPLASTY  09/21/1999   CESAREAN SECTION  2007 and 2009   COLONOSCOPY WITH PROPOFOL N/A 06/19/2021   Procedure: COLONOSCOPY WITH PROPOFOL;  Surgeon: Pasty Spillers, MD;  Location: [facility] ENDOSCOPY;  Service: Endoscopy;  Laterality: N/A;   COSMETIC SURGERY     tummy tuck  09/21/1999    Prior to Admission medications   Medication Sig Start Date End Date Taking? Authorizing Provider  acetaminophen (TYLENOL) 500 MG tablet Take 500 mg by mouth every 6 (six) hours as needed.    [provider]  ALPRAZolam Prudy Feeler) 0.25 MG tablet Take 1 tablet (0.25 mg total) by mouth daily as needed for anxiety. 12/03/23   Dale [place], MD  buPROPion (WELLBUTRIN SR) 150 MG 12 hr tablet Take 1 tablet (150 mg total) by mouth 2 (two) times daily at 8am and 1pm. 09/26/23     Certolizumab Pegol 2 X 200 MG/ML PSKT Inject into the skin every 30 (thirty) days.    [provider]  cetirizine (ZYRTEC) 10 MG tablet Take 1 tablet (10 mg total) by mouth daily. 09/13/22   Worthy Rancher B, FNP  cyanocobalamin (VITAMIN B12) 1000 MCG/ML injection Inject 1 mL (1,000 mcg total) into the muscle every 30 (thirty) days. 12/26/23   Dale [place], MD  famotidine (PEPCID) 20 MG tablet Take 20 mg by mouth 2  (two) times daily.    [provider]  ferrous sulfate 325 (65 FE) MG tablet Take 1 tablet (325 mg total) by mouth daily with breakfast. 09/13/22   Worthy Rancher B, FNP  fluticasone (FLONASE) 50 MCG/ACT nasal spray Place 2 sprays into both nostrils daily. 12/13/23   Dale [place], MD  gabapentin (NEURONTIN) 300 MG capsule Take 3 capsules (900 mg total) by mouth every evening. 12/19/23 12/18/24  Dale [place], MD  hydroxychloroquine (PLAQUENIL) 200 MG tablet Take 1 tablet (200 mg total) by mouth 2 (two) times daily. 07/15/23   Patel, Mayur K, MD  Melatonin 10 MG TABS Take 10 mg by mouth at bedtime.    [provider]  meloxicam (MOBIC) 7.5 MG tablet Take 1 tablet (7.5 mg total) by mouth 2 (two) times daily. 12/19/23     metoprolol succinate (TOPROL XL) 25 MG 24 hr tablet Take 1 tablet (25 mg total) by mouth daily. 06/01/23   Debbe Odea, MD  pantoprazole (PROTONIX) 20 MG tablet Take 1 tablet (20 mg total) by mouth daily. 12/26/23   Dale , MD  rosuvastatin (CRESTOR) 10 MG tablet Take 1 tablet (10 mg total) by mouth daily. 09/05/23 09/04/24  Dale , MD  Sodium Sulfate-Mag Sulfate-KCl (SUTAB) [phone] MG TABS At 5 PM take 12 tablets using the 8  oz cup provided in the kit drinking 5 cups of water and repeat the same process 5 hours before your procedure. 12/15/23   Wyline Mood, MD  Syringe/Needle, Disp, (SYRINGE 3CC/25GX1") 25G X 1" 3 ML MISC Use to give b12 injections 12/26/23   Dale [place], MD    Allergies as of 12/16/2023   (No Known Allergies)    Family History  Problem Relation Age of Onset   Healthy Mother    Hypertension Mother    Miscarriages / [place] Mother    Cancer Father        non hogkins lymphoma   Atrial fibrillation Father    Heart disease Father    Hypertension Father    Depression Father    Arthritis Sister    Asthma Sister    Hypertension Sister    Diabetes Sister    Healthy Daughter    Healthy Son    Alzheimer's  disease Maternal Grandmother    Healthy Sister    Arthritis Maternal Grandfather    Colon cancer Paternal Uncle 52   Breast cancer Neg Hx     Social History   Socioeconomic History   Marital status: Married    Spouse name: Not on file   Number of children: Not on file   Years of education: Not on file   Highest education level: Associate degree: academic program  Occupational History   Not on file  Tobacco Use   Smoking status: Never   Smokeless tobacco: Never  Vaping Use   Vaping status: Never Used  Substance and Sexual Activity   Alcohol use: Yes    Alcohol/week: 5.0 standard drinks of alcohol    Types: 5 Cans of beer per week    Comment: occasional   Drug use: No   Sexual activity: Yes    Birth control/protection: None    Comment: Husband had a vasectomy  Other Topics Concern   Not on file  Social History Narrative   Not on file   Social Drivers of Health   Financial Resource Strain: Low Risk  (07/19/2023)   Overall Financial Resource Strain (CARDIA)    Difficulty of Paying Living Expenses: Not hard at all  Food Insecurity: No Food Insecurity (07/19/2023)   Hunger Vital Sign    Worried About Running Out of Food in the Last Year: Never true    Ran Out of Food in the Last Year: Never true  Transportation Needs: No Transportation Needs (07/19/2023)   PRAPARE - Administrator, Civil Service (Medical): No    Lack of Transportation (Non-Medical): No  Physical Activity: Unknown (07/19/2023)   Exercise Vital Sign    Days of Exercise per Week: 0 days    Minutes of Exercise per Session: Not on file  Stress: Stress Concern Present (07/19/2023)   [employer] of Occupational Health - Occupational Stress Questionnaire    Feeling of Stress : Rather much  Social Connections: Socially Integrated (07/19/2023)   Social Connection and Isolation Panel [NHANES]    Frequency of Communication with Friends and Family: More than three times a week    Frequency of  Social Gatherings with Friends and Family: Once a week    Attends Religious Services: More than 4 times per year    Active Member of [employer] or Organizations: Yes    Attends Engineer, structural: More than 4 times per year    Marital Status: Married  Catering manager Violence: Not on file    Review of Systems:  See HPI, otherwise negative ROS  Physical Exam: There were no vitals taken for this visit. General:   Alert,  pleasant and cooperative in NAD Head:  Normocephalic and atraumatic. Neck:  Supple; no masses or thyromegaly. Lungs:  Clear throughout to auscultation, normal respiratory effort.    Heart:  +S1, +S2, Regular rate and rhythm, No edema. Abdomen:  Soft, nontender and nondistended. Normal bowel sounds, without guarding, and without rebound.   Neurologic:  Alert and  oriented x4;  grossly normal neurologically.  Impression/Plan: Tamara Shah is here for an colonoscopy to be performed for surveillance due to prior history of colon polyps   Risks, benefits, limitations, and alternatives regarding  colonoscopy have been reviewed with the patient.  Questions have been answered.  All parties agreeable.   Luke Salaam, MD  01/04/2024, 9:16 AM

## 2024-01-04 NOTE — Transfer of Care (Signed)
 Immediate Anesthesia Transfer of Care Note  Patient: Tamara Shah  Procedure(s) Performed: COLONOSCOPY  Patient Location: PACU  Anesthesia Type:General  Level of Consciousness: sedated  Airway & Oxygen Therapy: Patient Spontanous Breathing  Post-op Assessment: Report given to RN and Post -op Vital signs reviewed and stable  Post vital signs: Reviewed and stable  Last Vitals:  Vitals Value Taken Time  BP    Temp    Pulse    Resp    SpO2      Last Pain:  Vitals:   01/04/24 0957  TempSrc:   PainSc: 0-No pain         Complications: No notable events documented.

## 2024-01-04 NOTE — Anesthesia Postprocedure Evaluation (Signed)
 Anesthesia Post Note  Patient: Tamara Shah  Procedure(s) Performed: COLONOSCOPY  Patient location during evaluation: Endoscopy Anesthesia Type: General Level of consciousness: awake and alert Pain management: pain level controlled Vital Signs Assessment: post-procedure vital signs reviewed and stable Respiratory status: spontaneous breathing, nonlabored ventilation and respiratory function stable Cardiovascular status: blood pressure returned to baseline and stable Postop Assessment: no apparent nausea or vomiting Anesthetic complications: no   No notable events documented.   Last Vitals:  Vitals:   01/04/24 1007 01/04/24 1017  BP: 102/72 110/74  Pulse: 68   Resp: (!) 7 20  Temp:    SpO2: 93% 98%    Last Pain:  Vitals:   01/04/24 1017  TempSrc:   PainSc: 0-No pain                 Baltazar Bonier

## 2024-01-05 ENCOUNTER — Encounter: Payer: Self-pay | Admitting: Gastroenterology

## 2024-01-05 LAB — SURGICAL PATHOLOGY

## 2024-01-08 ENCOUNTER — Encounter: Payer: Self-pay | Admitting: Gastroenterology

## 2024-01-09 ENCOUNTER — Other Ambulatory Visit: Payer: Self-pay

## 2024-01-09 MED FILL — Fluticasone Propionate Nasal Susp 50 MCG/ACT: NASAL | 30 days supply | Qty: 16 | Fill #1 | Status: AC

## 2024-01-10 ENCOUNTER — Ambulatory Visit

## 2024-01-10 ENCOUNTER — Ambulatory Visit (INDEPENDENT_AMBULATORY_CARE_PROVIDER_SITE_OTHER)

## 2024-01-10 ENCOUNTER — Other Ambulatory Visit: Payer: Self-pay

## 2024-01-10 DIAGNOSIS — E538 Deficiency of other specified B group vitamins: Secondary | ICD-10-CM | POA: Diagnosis not present

## 2024-01-10 MED ORDER — HYDROXYCHLOROQUINE SULFATE 200 MG PO TABS
200.0000 mg | ORAL_TABLET | Freq: Two times a day (BID) | ORAL | 1 refills | Status: DC
  Filled 2024-01-10: qty 180, 90d supply, fill #0
  Filled 2024-04-10: qty 180, 90d supply, fill #1

## 2024-01-10 NOTE — Progress Notes (Signed)
 Pt came in to office for teaching of b12 injection.

## 2024-01-11 DIAGNOSIS — F331 Major depressive disorder, recurrent, moderate: Secondary | ICD-10-CM | POA: Diagnosis not present

## 2024-01-13 ENCOUNTER — Encounter: Payer: Self-pay | Admitting: Internal Medicine

## 2024-01-13 NOTE — Telephone Encounter (Signed)
 I do not mind doing a letter, but need to know specifically what it needs to say.

## 2024-01-16 NOTE — Telephone Encounter (Signed)
Letter printed and placed in box.

## 2024-01-17 DIAGNOSIS — F411 Generalized anxiety disorder: Secondary | ICD-10-CM | POA: Diagnosis not present

## 2024-01-19 ENCOUNTER — Other Ambulatory Visit (HOSPITAL_COMMUNITY): Payer: Self-pay

## 2024-01-19 ENCOUNTER — Other Ambulatory Visit: Payer: Self-pay

## 2024-01-19 MED FILL — Gabapentin Cap 300 MG: ORAL | 30 days supply | Qty: 90 | Fill #1 | Status: CN

## 2024-01-24 ENCOUNTER — Other Ambulatory Visit: Payer: Self-pay

## 2024-01-24 DIAGNOSIS — M0609 Rheumatoid arthritis without rheumatoid factor, multiple sites: Secondary | ICD-10-CM | POA: Diagnosis not present

## 2024-01-27 ENCOUNTER — Other Ambulatory Visit: Payer: Self-pay

## 2024-01-27 MED FILL — Gabapentin Cap 300 MG: ORAL | 30 days supply | Qty: 90 | Fill #1 | Status: AC

## 2024-01-31 DIAGNOSIS — F411 Generalized anxiety disorder: Secondary | ICD-10-CM | POA: Diagnosis not present

## 2024-02-07 ENCOUNTER — Encounter (INDEPENDENT_AMBULATORY_CARE_PROVIDER_SITE_OTHER): Payer: Self-pay

## 2024-02-07 DIAGNOSIS — M150 Primary generalized (osteo)arthritis: Secondary | ICD-10-CM | POA: Diagnosis not present

## 2024-02-07 DIAGNOSIS — Z7960 Long term (current) use of unspecified immunomodulators and immunosuppressants: Secondary | ICD-10-CM | POA: Diagnosis not present

## 2024-02-07 DIAGNOSIS — M0609 Rheumatoid arthritis without rheumatoid factor, multiple sites: Secondary | ICD-10-CM | POA: Diagnosis not present

## 2024-02-08 DIAGNOSIS — F331 Major depressive disorder, recurrent, moderate: Secondary | ICD-10-CM | POA: Diagnosis not present

## 2024-02-09 ENCOUNTER — Other Ambulatory Visit: Payer: Self-pay

## 2024-02-09 MED FILL — Fluticasone Propionate Nasal Susp 50 MCG/ACT: NASAL | 30 days supply | Qty: 16 | Fill #2 | Status: CN

## 2024-02-14 DIAGNOSIS — F411 Generalized anxiety disorder: Secondary | ICD-10-CM | POA: Diagnosis not present

## 2024-02-20 ENCOUNTER — Other Ambulatory Visit: Payer: Self-pay

## 2024-02-20 ENCOUNTER — Other Ambulatory Visit: Payer: Self-pay | Admitting: Internal Medicine

## 2024-02-21 ENCOUNTER — Other Ambulatory Visit: Payer: Self-pay

## 2024-02-21 DIAGNOSIS — M0609 Rheumatoid arthritis without rheumatoid factor, multiple sites: Secondary | ICD-10-CM | POA: Diagnosis not present

## 2024-02-21 MED FILL — Fluticasone Propionate Nasal Susp 50 MCG/ACT: NASAL | 30 days supply | Qty: 16 | Fill #2 | Status: AC

## 2024-02-21 MED FILL — Gabapentin Cap 300 MG: ORAL | 30 days supply | Qty: 90 | Fill #0 | Status: CN

## 2024-02-21 MED FILL — Gabapentin Cap 300 MG: ORAL | 30 days supply | Qty: 90 | Fill #0 | Status: AC

## 2024-02-22 DIAGNOSIS — F331 Major depressive disorder, recurrent, moderate: Secondary | ICD-10-CM | POA: Diagnosis not present

## 2024-02-28 DIAGNOSIS — F411 Generalized anxiety disorder: Secondary | ICD-10-CM | POA: Diagnosis not present

## 2024-03-04 ENCOUNTER — Encounter: Payer: Self-pay | Admitting: Internal Medicine

## 2024-03-05 ENCOUNTER — Telehealth: Admitting: Nurse Practitioner

## 2024-03-05 ENCOUNTER — Other Ambulatory Visit: Payer: Self-pay

## 2024-03-05 ENCOUNTER — Encounter: Payer: Self-pay | Admitting: Nurse Practitioner

## 2024-03-05 VITALS — Ht 62.0 in | Wt 216.0 lb

## 2024-03-05 DIAGNOSIS — L03031 Cellulitis of right toe: Secondary | ICD-10-CM

## 2024-03-05 MED ORDER — CEPHALEXIN 500 MG PO CAPS
500.0000 mg | ORAL_CAPSULE | Freq: Four times a day (QID) | ORAL | 0 refills | Status: DC
  Filled 2024-03-05: qty 28, 7d supply, fill #0

## 2024-03-05 MED ORDER — MUPIROCIN 2 % EX OINT
1.0000 | TOPICAL_OINTMENT | Freq: Three times a day (TID) | CUTANEOUS | 0 refills | Status: DC
  Filled 2024-03-05: qty 22, 8d supply, fill #0

## 2024-03-05 NOTE — Progress Notes (Signed)
 MyChart Video Visit    Virtual Visit via Video Note   This visit type was conducted because this format is felt to be most appropriate for this patient at this time. Physical exam was limited by quality of the video and audio technology used for the visit. CMA was able to get the patient set up on a video visit.  Patient location: Home. Patient and provider in visit Provider location: Office  I discussed the limitations of evaluation and management by telemedicine and the availability of in person appointments. The patient expressed understanding and agreed to proceed.  Visit Date: 03/05/2024  Today's healthcare provider: Bluford Burkitt, NP     Subjective:    [name], female    DOB: October 07, 1970, 53 y.o.   MRN: [id number]  No chief complaint on file.   HPI  Discussed the use of AI scribe software for clinical note transcription with the patient, who gave verbal consent to proceed.  History of Present Illness   Tamara Shah is a 53 year old female who presents with a suspected paronychia on her big toe.  She noticed the issue on Saturday when her toe began to hurt slightly, particularly when removing her sock. Pus was oozing from the area, similar to her previous experience with paronychia. She has been managing the condition by soaking her toe in Epsom salt, applying Neosporin, and wrapping it with band-aids. Additionally, she uses a cushioned cover for extra padding.  Currently, there is no pain and no further pus discharge since the initial incident. She is concerned about her upcoming travel to [place]  and wants to ensure she has the appropriate treatment. She recalls needing oral antibiotics for a previous, more severe occurrence of paronychia, but she is cautious about taking antibiotics due to her Cimzia  injections.  No fever or heat in the affected area. The toe is red but not currently oozing. She plans to monitor the condition closely while  traveling.      Past Medical History:  Diagnosis Date   Allergy    Anemia    Anxiety    Arthritis    Blood in stool    H/O   Complication of anesthesia    Depression    GERD (gastroesophageal reflux disease)    History of chicken pox    Hx: UTI (urinary tract infection)    Hyperlipidemia    PONV (postoperative nausea and vomiting)     Past Surgical History:  Procedure Laterality Date   AUGMENTATION MAMMAPLASTY  09/21/1999   CESAREAN SECTION  2007 and 2009   COLONOSCOPY N/A 01/04/2024   Procedure: COLONOSCOPY;  Surgeon: Luke Salaam, MD;  Location: [facility] ENDOSCOPY;  Service: Gastroenterology;  Laterality: N/A;   COLONOSCOPY WITH PROPOFOL  N/A 06/19/2021   Procedure: COLONOSCOPY WITH PROPOFOL ;  Surgeon: Irby Mannan, MD;  Location: [facility] ENDOSCOPY;  Service: Endoscopy;  Laterality: N/A;   COSMETIC SURGERY     tummy tuck  09/21/1999    Family History  Problem Relation Age of Onset   Healthy Mother    Hypertension Mother    Miscarriages / [place] Mother    Cancer Father        non hogkins lymphoma   Atrial fibrillation Father    Heart disease Father    Hypertension Father    Depression Father    Arthritis Sister    Asthma Sister    Hypertension Sister    Diabetes Sister    Healthy Daughter  Healthy Son    Alzheimer's disease Maternal Grandmother    Healthy Sister    Arthritis Maternal Grandfather    Colon cancer Paternal Uncle 72   Breast cancer Neg Hx     Social History   Socioeconomic History   Marital status: Married    Spouse name: Not on file   Number of children: Not on file   Years of education: Not on file   Highest education level: Associate degree: academic program  Occupational History   Not on file  Tobacco Use   Smoking status: Never   Smokeless tobacco: Never  Vaping Use   Vaping status: Never Used  Substance and Sexual Activity   Alcohol use: Yes    Alcohol/week: 5.0 standard drinks of alcohol    Types: 5 Cans of beer per  week    Comment: occasional   Drug use: No   Sexual activity: Yes    Birth control/protection: None    Comment: Husband had a vasectomy  Other Topics Concern   Not on file  Social History Narrative   Not on file   Social Drivers of Health   Financial Resource Strain: Low Risk  (07/19/2023)   Overall Financial Resource Strain (CARDIA)    Difficulty of Paying Living Expenses: Not hard at all  Food Insecurity: No Food Insecurity (07/19/2023)   Hunger Vital Sign    Worried About Running Out of Food in the Last Year: Never true    Ran Out of Food in the Last Year: Never true  Transportation Needs: No Transportation Needs (07/19/2023)   PRAPARE - Administrator, Civil Service (Medical): No    Lack of Transportation (Non-Medical): No  Physical Activity: Unknown (07/19/2023)   Exercise Vital Sign    Days of Exercise per Week: 0 days    Minutes of Exercise per Session: Not on file  Stress: Stress Concern Present (07/19/2023)   [employer] of Occupational Health - Occupational Stress Questionnaire    Feeling of Stress : Rather much  Social Connections: Socially Integrated (07/19/2023)   Social Connection and Isolation Panel    Frequency of Communication with Friends and Family: More than three times a week    Frequency of Social Gatherings with Friends and Family: Once a week    Attends Religious Services: More than 4 times per year    Active Member of [employer] or Organizations: Yes    Attends Engineer, structural: More than 4 times per year    Marital Status: Married  Catering manager Violence: Not on file    Outpatient Medications Prior to Visit  Medication Sig Dispense Refill   acetaminophen (TYLENOL) 500 MG tablet Take 500 mg by mouth every 6 (six) hours as needed.     ALPRAZolam  (XANAX ) 0.25 MG tablet Take 1 tablet (0.25 mg total) by mouth daily as needed for anxiety. 30 tablet 0   buPROPion  (WELLBUTRIN  SR) 150 MG 12 hr tablet Take 1 tablet (150 mg  total) by mouth 2 (two) times daily at 8am and 1pm. 180 tablet 1   Certolizumab Pegol  2 X 200 MG/ML PSKT Inject into the skin every 30 (thirty) days.     cetirizine  (ZYRTEC ) 10 MG tablet Take 1 tablet (10 mg total) by mouth daily. 30 tablet 1   cyanocobalamin  (VITAMIN B12) 1000 MCG/ML injection Inject 1 mL (1,000 mcg total) into the muscle every 30 (thirty) days. 10 mL 0   famotidine (PEPCID) 20 MG tablet Take 20 mg by mouth  2 (two) times daily.     ferrous sulfate  325 (65 FE) MG tablet Take 1 tablet (325 mg total) by mouth daily with breakfast. 30 tablet 1   fluticasone  (FLONASE ) 50 MCG/ACT nasal spray Place 2 sprays into both nostrils daily. 16 g 3   gabapentin  (NEURONTIN ) 300 MG capsule Take 3 capsules (900 mg total) by mouth every evening. 90 capsule 1   hydroxychloroquine  (PLAQUENIL ) 200 MG tablet Take 1 tablet (200 mg total) by mouth 2 (two) times daily. 180 tablet 1   Melatonin 10 MG TABS Take 10 mg by mouth at bedtime.     meloxicam  (MOBIC ) 7.5 MG tablet Take 1 tablet (7.5 mg total) by mouth 2 (two) times daily. 60 tablet 3   metoprolol  succinate (TOPROL  XL) 25 MG 24 hr tablet Take 1 tablet (25 mg total) by mouth daily. 90 tablet 3   pantoprazole  (PROTONIX ) 20 MG tablet Take 1 tablet (20 mg total) by mouth daily. 90 tablet 1   rosuvastatin  (CRESTOR ) 10 MG tablet Take 1 tablet (10 mg total) by mouth daily. 90 tablet 2   Sodium Sulfate-Mag Sulfate-KCl (SUTAB ) [phone] MG TABS At 5 PM take 12 tablets using the 8 oz cup provided in the kit drinking 5 cups of water and repeat the same process 5 hours before your procedure. 24 tablet 0   Syringe/Needle, Disp, (SYRINGE 3CC/25GX1) 25G X 1 3 ML MISC Use to give b12 injections 50 each 0   No facility-administered medications prior to visit.    No Known Allergies  ROS See HPI    Objective:    Physical Exam  Ht 5' 2 (1.575 m)   Wt 216 lb (98 kg)   BMI 39.51 kg/m  Wt Readings from Last 3 Encounters:  03/05/24 216 lb (98 kg)   01/04/24 216 lb 12.8 oz (98.3 kg)  12/26/23 218 lb 9.6 oz (99.2 kg)   GENERAL: alert, oriented, appears well and in no acute distress   HEENT: atraumatic, conjunttiva clear, no obvious abnormalities on inspection of external nose and ears   NECK: normal movements of the head and neck   LUNGS: on inspection no signs of respiratory distress, breathing rate appears normal, no obvious gross SOB, gasping or wheezing   CV: no obvious cyanosis   MS: moves all visible extremities without noticeable abnormality  FOOT: Right great toe with surrounding erythema. No discharge visualized.    PSYCH/NEURO: pleasant and cooperative, no obvious depression or anxiety, speech and thought processing grossly intact    Assessment & Plan:   Problem List Items Addressed This Visit     Paronychia of great toe of right foot - Primary   Paronychia on the big toe is likely bacterial, with resolved pain and pus discharge. The toe is red but not hot. A conservative approach with topical treatment is preferred. Prescribe mupirocin  ointment for topical application. Advise keeping the toe clean, continue epsom salt soaks. Prescribe cephalexin as a backup oral antibiotic if the condition worsens while traveling. Instruct her to monitor the toe daily for increased pain, swelling, or pus. Advise keeping the toe covered in public and airing it out at night.      Relevant Medications   mupirocin  cream (BACTROBAN ) 2 %   cephALEXin (KEFLEX) 500 MG capsule    I am having Tamara Shah start on mupirocin  cream and cephALEXin. I am also having her maintain her acetaminophen, famotidine, Melatonin, certolizumab pegol , cetirizine , ferrous sulfate , metoprolol  succinate, rosuvastatin , buPROPion , ALPRAZolam , fluticasone , Sutab , meloxicam , pantoprazole ,  cyanocobalamin , SYRINGE 3CC/25GX1, hydroxychloroquine , and gabapentin .  Meds ordered this encounter  Medications   mupirocin  cream (BACTROBAN ) 2 %    Sig: Apply 1  Application topically 3 (three) times daily.    Dispense:  30 g    Refill:  0    Supervising Provider:   Creta Dolin L [[id number]]   cephALEXin (KEFLEX) 500 MG capsule    Sig: Take 1 capsule (500 mg total) by mouth 4 (four) times daily.    Dispense:  28 capsule    Refill:  0    Supervising Provider:   Thersia Flax [[id number]]    I discussed the assessment and treatment plan with the patient. The patient was provided an opportunity to ask questions and all were answered. The patient agreed with the plan and demonstrated an understanding of the instructions.   The patient was advised to call back or seek an in-person evaluation if the symptoms worsen or if the condition fails to improve as anticipated.   Bluford Burkitt, NP [facility] [phone] (phone) [phone] (fax)  [facility]

## 2024-03-05 NOTE — Assessment & Plan Note (Addendum)
 Paronychia on the big toe is likely bacterial, with resolved pain and pus discharge. The toe is red but not hot. A conservative approach with topical treatment is preferred. Prescribe mupirocin  ointment for topical application. Advise keeping the toe clean, continue epsom salt soaks. Prescribe cephalexin as a backup oral antibiotic if the condition worsens while traveling. Instruct her to monitor the toe daily for increased pain, swelling, or pus. Advise keeping the toe covered in public and airing it out at night.

## 2024-03-05 NOTE — Telephone Encounter (Signed)
Pt scheduled with Arville Lime

## 2024-03-15 DIAGNOSIS — F411 Generalized anxiety disorder: Secondary | ICD-10-CM | POA: Diagnosis not present

## 2024-03-19 ENCOUNTER — Other Ambulatory Visit: Payer: Self-pay

## 2024-03-20 DIAGNOSIS — M0609 Rheumatoid arthritis without rheumatoid factor, multiple sites: Secondary | ICD-10-CM | POA: Diagnosis not present

## 2024-03-20 MED FILL — Fluticasone Propionate Nasal Susp 50 MCG/ACT: NASAL | 30 days supply | Qty: 16 | Fill #3 | Status: AC

## 2024-03-20 MED FILL — Gabapentin Cap 300 MG: ORAL | 30 days supply | Qty: 90 | Fill #1 | Status: AC

## 2024-04-04 DIAGNOSIS — F331 Major depressive disorder, recurrent, moderate: Secondary | ICD-10-CM | POA: Diagnosis not present

## 2024-04-10 ENCOUNTER — Other Ambulatory Visit: Payer: Self-pay

## 2024-04-11 ENCOUNTER — Other Ambulatory Visit: Payer: Self-pay

## 2024-04-11 MED ORDER — MELOXICAM 7.5 MG PO TABS
7.5000 mg | ORAL_TABLET | Freq: Two times a day (BID) | ORAL | 3 refills | Status: DC
  Filled 2024-04-11 – 2024-04-13 (×2): qty 60, 30d supply, fill #0
  Filled 2024-05-20: qty 60, 30d supply, fill #1
  Filled 2024-06-18: qty 60, 30d supply, fill #2
  Filled 2024-07-18: qty 60, 30d supply, fill #3

## 2024-04-13 ENCOUNTER — Other Ambulatory Visit: Payer: Self-pay

## 2024-04-17 DIAGNOSIS — M0609 Rheumatoid arthritis without rheumatoid factor, multiple sites: Secondary | ICD-10-CM | POA: Diagnosis not present

## 2024-04-26 ENCOUNTER — Other Ambulatory Visit (INDEPENDENT_AMBULATORY_CARE_PROVIDER_SITE_OTHER)

## 2024-04-26 ENCOUNTER — Other Ambulatory Visit

## 2024-04-26 DIAGNOSIS — E7800 Pure hypercholesterolemia, unspecified: Secondary | ICD-10-CM

## 2024-04-26 DIAGNOSIS — E538 Deficiency of other specified B group vitamins: Secondary | ICD-10-CM | POA: Diagnosis not present

## 2024-04-26 DIAGNOSIS — F411 Generalized anxiety disorder: Secondary | ICD-10-CM | POA: Diagnosis not present

## 2024-04-26 LAB — LIPID PANEL
Cholesterol: 157 mg/dL (ref 0–200)
HDL: 44.3 mg/dL (ref >39.00)
LDL Cholesterol: 71 mg/dL (ref 0–99)
NonHDL: 112.66
Total CHOL/HDL Ratio: 4
Triglycerides: 210 mg/dL — ABNORMAL HIGH (ref 0.0–149.0)
VLDL: 42 mg/dL — ABNORMAL HIGH (ref 0.0–40.0)

## 2024-04-26 LAB — CBC WITH DIFFERENTIAL/PLATELET
Basophils Absolute: 0 K/uL (ref 0.0–0.1)
Basophils Relative: 0.4 % (ref 0.0–3.0)
Eosinophils Absolute: 0.2 K/uL (ref 0.0–0.7)
Eosinophils Relative: 3.4 % (ref 0.0–5.0)
HCT: 40.7 % (ref 36.0–46.0)
Hemoglobin: 13.8 g/dL (ref 12.0–15.0)
Lymphocytes Relative: 39 % (ref 12.0–46.0)
Lymphs Abs: 1.8 K/uL (ref 0.7–4.0)
MCHC: 33.8 g/dL (ref 30.0–36.0)
MCV: 87.5 fl (ref 78.0–100.0)
Monocytes Absolute: 0.4 K/uL (ref 0.1–1.0)
Monocytes Relative: 8.3 % (ref 3.0–12.0)
Neutro Abs: 2.3 K/uL (ref 1.4–7.7)
Neutrophils Relative %: 48.9 % (ref 43.0–77.0)
Platelets: 273 K/uL (ref 150.0–400.0)
RBC: 4.65 Mil/uL (ref 3.87–5.11)
RDW: 12 % (ref 11.5–15.5)
WBC: 4.7 K/uL (ref 4.0–10.5)

## 2024-04-26 LAB — HEPATIC FUNCTION PANEL
ALT: 14 U/L (ref 0–35)
AST: 15 U/L (ref 0–37)
Albumin: 4.3 g/dL (ref 3.5–5.2)
Alkaline Phosphatase: 74 U/L (ref 39–117)
Bilirubin, Direct: 0.1 mg/dL (ref 0.0–0.3)
Total Bilirubin: 0.5 mg/dL (ref 0.2–1.2)
Total Protein: 6.1 g/dL (ref 6.0–8.3)

## 2024-04-26 LAB — BASIC METABOLIC PANEL WITH GFR
BUN: 13 mg/dL (ref 6–23)
CO2: 29 meq/L (ref 19–32)
Calcium: 9.4 mg/dL (ref 8.4–10.5)
Chloride: 104 meq/L (ref 96–112)
Creatinine, Ser: 0.89 mg/dL (ref 0.40–1.20)
GFR: 74.16 mL/min (ref >60.00)
Glucose, Bld: 88 mg/dL (ref 70–99)
Potassium: 4 meq/L (ref 3.5–5.1)
Sodium: 142 meq/L (ref 135–145)

## 2024-04-26 LAB — VITAMIN B12: Vitamin B-12: 598 pg/mL (ref 211–911)

## 2024-04-27 ENCOUNTER — Ambulatory Visit: Payer: Self-pay | Admitting: Internal Medicine

## 2024-05-01 ENCOUNTER — Other Ambulatory Visit: Payer: Self-pay

## 2024-05-01 ENCOUNTER — Encounter: Payer: Self-pay | Admitting: Internal Medicine

## 2024-05-01 ENCOUNTER — Ambulatory Visit (INDEPENDENT_AMBULATORY_CARE_PROVIDER_SITE_OTHER): Admitting: Internal Medicine

## 2024-05-01 VITALS — BP 118/60 | HR 70 | Temp 99.2°F | Ht 62.0 in | Wt 209.4 lb

## 2024-05-01 DIAGNOSIS — R238 Other skin changes: Secondary | ICD-10-CM | POA: Diagnosis not present

## 2024-05-01 DIAGNOSIS — F439 Reaction to severe stress, unspecified: Secondary | ICD-10-CM | POA: Diagnosis not present

## 2024-05-01 DIAGNOSIS — E538 Deficiency of other specified B group vitamins: Secondary | ICD-10-CM

## 2024-05-01 DIAGNOSIS — Z0000 Encounter for general adult medical examination without abnormal findings: Secondary | ICD-10-CM

## 2024-05-01 DIAGNOSIS — M069 Rheumatoid arthritis, unspecified: Secondary | ICD-10-CM | POA: Diagnosis not present

## 2024-05-01 DIAGNOSIS — E7800 Pure hypercholesterolemia, unspecified: Secondary | ICD-10-CM

## 2024-05-01 DIAGNOSIS — I471 Supraventricular tachycardia, unspecified: Secondary | ICD-10-CM | POA: Diagnosis not present

## 2024-05-01 DIAGNOSIS — Z1211 Encounter for screening for malignant neoplasm of colon: Secondary | ICD-10-CM | POA: Diagnosis not present

## 2024-05-01 DIAGNOSIS — D473 Essential (hemorrhagic) thrombocythemia: Secondary | ICD-10-CM | POA: Diagnosis not present

## 2024-05-01 DIAGNOSIS — K219 Gastro-esophageal reflux disease without esophagitis: Secondary | ICD-10-CM

## 2024-05-01 MED ORDER — ROSUVASTATIN CALCIUM 10 MG PO TABS
10.0000 mg | ORAL_TABLET | Freq: Every day | ORAL | 2 refills | Status: AC
  Filled 2024-05-01 – 2024-05-20 (×2): qty 90, 90d supply, fill #0
  Filled 2024-08-17: qty 90, 90d supply, fill #1

## 2024-05-01 MED ORDER — "SYRINGE 25G X 1"" 3 ML MISC"
0 refills | Status: AC
  Filled 2024-05-01: qty 50, fill #0
  Filled 2024-06-22 – 2024-09-21 (×3): qty 3, 90d supply, fill #0

## 2024-05-01 MED ORDER — GABAPENTIN 300 MG PO CAPS
900.0000 mg | ORAL_CAPSULE | Freq: Every evening | ORAL | 5 refills | Status: DC
  Filled 2024-05-01: qty 270, 90d supply, fill #0
  Filled 2024-08-01: qty 270, 90d supply, fill #1

## 2024-05-01 MED ORDER — PANTOPRAZOLE SODIUM 20 MG PO TBEC
20.0000 mg | DELAYED_RELEASE_TABLET | Freq: Every day | ORAL | 1 refills | Status: AC
  Filled 2024-05-01 – 2024-10-16 (×4): qty 90, 90d supply, fill #0

## 2024-05-01 MED ORDER — CYANOCOBALAMIN 1000 MCG/ML IJ SOLN
1000.0000 ug | INTRAMUSCULAR | 0 refills | Status: AC
  Filled 2024-05-01: qty 10, 300d supply, fill #0
  Filled 2024-06-22 – 2024-09-21 (×3): qty 3, 90d supply, fill #0

## 2024-05-01 NOTE — Progress Notes (Signed)
 Subjective:    [name], female    DOB: 1971/05/19, 53 y.o.   MRN: [id number]  Patient here for  Chief Complaint  Patient presents with   Annual Exam    HPI Here for a physical exam. Seeing rheumatology 04/17/24 - cimzia . Tolerating injections well. Joints stable. Seeing a Veterinary surgeon. Doing better regarding her home stress. She is having increased stress - mother's health issues and placement issues. Breathing stable. Had flare of gastritis last week. Resolved now. No abdominal pain. Bowels - at her baseline. Reports - scalp - dry and scaling. Has tried selsun shampoos,etc. No change. Also dry skin. Discussed dermatology referral.    Past Medical History:  Diagnosis Date   Allergy    Anemia    Anxiety    Arthritis    Blood in stool    H/O   Complication of anesthesia    Depression    GERD (gastroesophageal reflux disease)    History of chicken pox    Hx: UTI (urinary tract infection)    Hyperlipidemia    PONV (postoperative nausea and vomiting)    Past Surgical History:  Procedure Laterality Date   AUGMENTATION MAMMAPLASTY  09/21/1999   CESAREAN SECTION  2007 and 2009   COLONOSCOPY N/A 01/04/2024   Procedure: COLONOSCOPY;  Surgeon: Therisa Bi, MD;  Location: [facility] ENDOSCOPY;  Service: Gastroenterology;  Laterality: N/A;   COLONOSCOPY WITH PROPOFOL  N/A 06/19/2021   Procedure: COLONOSCOPY WITH PROPOFOL ;  Surgeon: Janalyn Keene NOVAK, MD;  Location: [facility] ENDOSCOPY;  Service: Endoscopy;  Laterality: N/A;   COSMETIC SURGERY     tummy tuck  09/21/1999   Family History  Problem Relation Age of Onset   Healthy Mother    Hypertension Mother    Miscarriages / [place] Mother    Cancer Father        non hogkins lymphoma   Atrial fibrillation Father    Heart disease Father    Hypertension Father    Depression Father    Arthritis Sister    Asthma Sister    Hypertension Sister    Diabetes Sister    Healthy Daughter    Healthy Son    Alzheimer's  disease Maternal Grandmother    Healthy Sister    Arthritis Maternal Grandfather    Colon cancer Paternal Uncle 32   Breast cancer Neg Hx    Social History   Socioeconomic History   Marital status: Married    Spouse name: Not on file   Number of children: Not on file   Years of education: Not on file   Highest education level: Associate degree: academic program  Occupational History   Not on file  Tobacco Use   Smoking status: Never   Smokeless tobacco: Never  Vaping Use   Vaping status: Never Used  Substance and Sexual Activity   Alcohol use: Yes    Alcohol/week: 5.0 standard drinks of alcohol    Types: 5 Cans of beer per week    Comment: occasional   Drug use: No   Sexual activity: Yes    Birth control/protection: None    Comment: Husband had a vasectomy  Other Topics Concern   Not on file  Social History Narrative   Not on file   Social Drivers of Health   Financial Resource Strain: Low Risk  (04/30/2024)   Overall Financial Resource Strain (CARDIA)    Difficulty of Paying Living Expenses: Not hard at all  Food Insecurity: No Food Insecurity (04/30/2024)  Hunger Vital Sign    Worried About Running Out of Food in the Last Year: Never true    Ran Out of Food in the Last Year: Never true  Transportation Needs: No Transportation Needs (04/30/2024)   PRAPARE - Administrator, Civil Service (Medical): No    Lack of Transportation (Non-Medical): No  Physical Activity: Inactive (04/30/2024)   Exercise Vital Sign    Days of Exercise per Week: 0 days    Minutes of Exercise per Session: Not on file  Stress: Stress Concern Present (04/30/2024)   [employer] of Occupational Health - Occupational Stress Questionnaire    Feeling of Stress: Rather much  Social Connections: Unknown (04/30/2024)   Social Connection and Isolation Panel    Frequency of Communication with Friends and Family: Once a week    Frequency of Social Gatherings with Friends and Family:  Patient declined    Attends Religious Services: Patient declined    Database administrator or Organizations: Yes    Attends Banker Meetings: Patient declined    Marital Status: Married     Review of Systems  Constitutional:  Negative for appetite change and unexpected weight change.  HENT:  Negative for congestion, sinus pressure and sore throat.   Eyes:  Negative for pain and visual disturbance.  Respiratory:  Negative for cough, chest tightness and shortness of breath.   Cardiovascular:  Negative for chest pain, palpitations and leg swelling.  Gastrointestinal:  Negative for abdominal pain, diarrhea, nausea and vomiting.  Genitourinary:  Negative for difficulty urinating and dysuria.  Musculoskeletal:        No increased joint swelling. Joints stable.   Skin:  Negative for color change and rash.  Neurological:  Negative for dizziness and headaches.  Hematological:  Negative for adenopathy. Does not bruise/bleed easily.  Psychiatric/Behavioral:  Negative for agitation and dysphoric mood.        Objective:     BP 118/60 (BP Location: Left Arm, Patient Position: Sitting, Cuff Size: Normal)   Pulse 70   Temp 99.2 F (37.3 C) (Oral)   Ht 5' 2 (1.575 m)   Wt 209 lb 6.4 oz (95 kg)   SpO2 95%   BMI 38.30 kg/m  Wt Readings from Last 3 Encounters:  05/01/24 209 lb 6.4 oz (95 kg)  03/05/24 216 lb (98 kg)  01/04/24 216 lb 12.8 oz (98.3 kg)    Physical Exam Vitals reviewed.  Constitutional:      General: She is not in acute distress.    Appearance: Normal appearance. She is well-developed.  HENT:     Head: Normocephalic and atraumatic.     Right Ear: External ear normal.     Left Ear: External ear normal.  Eyes:     General: No scleral icterus.       Right eye: No discharge.        Left eye: No discharge.     Conjunctiva/sclera: Conjunctivae normal.  Neck:     Thyroid : No thyromegaly.  Cardiovascular:     Rate and Rhythm: Normal rate and regular rhythm.   Pulmonary:     Effort: No tachypnea, accessory muscle usage or respiratory distress.     Breath sounds: Normal breath sounds. No decreased breath sounds, wheezing or rhonchi.     Comments: Breasts- bilateral implants - see remainder of breast exam.  Chest:  Breasts:    Right: No inverted nipple, mass, nipple discharge or tenderness (no axillary adenopathy).  Left: No inverted nipple, mass, nipple discharge or tenderness (no axilarry adenopathy).  Abdominal:     General: Bowel sounds are normal.     Palpations: Abdomen is soft.     Tenderness: There is no abdominal tenderness.  Musculoskeletal:        General: No swelling or tenderness.     Cervical back: Neck supple.  Lymphadenopathy:     Cervical: No cervical adenopathy.  Skin:    General: Skin is warm.     Findings: No erythema or rash.  Neurological:     Mental Status: She is alert and oriented to person, place, and time.  Psychiatric:        Mood and Affect: Mood normal.        Behavior: Behavior normal.         Outpatient Encounter Medications as of 05/01/2024  Medication Sig   acetaminophen (TYLENOL) 500 MG tablet Take 500 mg by mouth every 6 (six) hours as needed.   ALPRAZolam  (XANAX ) 0.25 MG tablet Take 1 tablet (0.25 mg total) by mouth daily as needed for anxiety.   buPROPion  (WELLBUTRIN  SR) 150 MG 12 hr tablet Take 1 tablet (150 mg total) by mouth 2 (two) times daily at 8am and 1pm.   Certolizumab Pegol  2 X 200 MG/ML PSKT Inject into the skin every 30 (thirty) days.   cetirizine  (ZYRTEC ) 10 MG tablet Take 1 tablet (10 mg total) by mouth daily.   famotidine (PEPCID) 20 MG tablet Take 20 mg by mouth 2 (two) times daily.   ferrous sulfate  325 (65 FE) MG tablet Take 1 tablet (325 mg total) by mouth daily with breakfast.   fluticasone  (FLONASE ) 50 MCG/ACT nasal spray Place 2 sprays into both nostrils daily.   hydroxychloroquine  (PLAQUENIL ) 200 MG tablet Take 1 tablet (200 mg total) by mouth 2 (two) times daily.    Melatonin 10 MG TABS Take 10 mg by mouth at bedtime.   meloxicam  (MOBIC ) 7.5 MG tablet Take 1 tablet (7.5 mg total) by mouth 2 (two) times daily.   metoprolol  succinate (TOPROL  XL) 25 MG 24 hr tablet Take 1 tablet (25 mg total) by mouth daily.   [DISCONTINUED] cyanocobalamin  (VITAMIN B12) 1000 MCG/ML injection Inject 1 mL (1,000 mcg total) into the muscle every 30 (thirty) days.   [DISCONTINUED] gabapentin  (NEURONTIN ) 300 MG capsule Take 3 capsules (900 mg total) by mouth every evening.   [DISCONTINUED] pantoprazole  (PROTONIX ) 20 MG tablet Take 1 tablet (20 mg total) by mouth daily.   [DISCONTINUED] rosuvastatin  (CRESTOR ) 10 MG tablet Take 1 tablet (10 mg total) by mouth daily.   [DISCONTINUED] Syringe/Needle, Disp, (SYRINGE 3CC/25GX1) 25G X 1 3 ML MISC Use to give b12 injections   cyanocobalamin  (VITAMIN B12) 1000 MCG/ML injection Inject 1 mL (1,000 mcg total) into the muscle every 30 (thirty) days.   gabapentin  (NEURONTIN ) 300 MG capsule Take 3 capsules (900 mg total) by mouth every evening.   pantoprazole  (PROTONIX ) 20 MG tablet Take 1 tablet (20 mg total) by mouth daily.   rosuvastatin  (CRESTOR ) 10 MG tablet Take 1 tablet (10 mg total) by mouth daily.   Syringe/Needle, Disp, (SYRINGE 3CC/25GX1) 25G X 1 3 ML MISC Use to give b12 injections   [DISCONTINUED] cephALEXin  (KEFLEX ) 500 MG capsule Take 1 capsule (500 mg total) by mouth 4 (four) times daily.   [DISCONTINUED] mupirocin  ointment (BACTROBAN ) 2 % Apply 1 Application topically 3 (three) times daily.   [DISCONTINUED] Sodium Sulfate-Mag Sulfate-KCl (SUTAB ) [phone] MG TABS At 5 PM take 12  tablets using the 8 oz cup provided in the kit drinking 5 cups of water and repeat the same process 5 hours before your procedure.   No facility-administered encounter medications on file as of 05/01/2024.     Lab Results  Component Value Date   WBC 4.7 04/26/2024   HGB 13.8 04/26/2024   HCT 40.7 04/26/2024   PLT 273.0 04/26/2024   GLUCOSE 88  04/26/2024   CHOL 157 04/26/2024   TRIG 210.0 (H) 04/26/2024   HDL 44.30 04/26/2024   LDLDIRECT 87.0 04/03/2019   LDLCALC 71 04/26/2024   ALT 14 04/26/2024   AST 15 04/26/2024   NA 142 04/26/2024   K 4.0 04/26/2024   CL 104 04/26/2024   CREATININE 0.89 04/26/2024   BUN 13 04/26/2024   CO2 29 04/26/2024   TSH 1.92 10/26/2023       Assessment & Plan:  Healthcare maintenance Assessment & Plan: Physical today 05/01/24.  PAP 01/29/22 - negative HPV.  Benign reactive/repair changes. Mammogram 11/03/23 - Birads I. Colonoscopy 01/04/24 - Colon, polyp(s), appendiceal orifice - SESSILE SERRATED ADENOMA (S) WITHOUT CYTOLOGIC DYSPLASIA. Transverse Colon Polyp - TUBULAR ADENOMA (S) WITHOUT HIGH GRADE DYSPLASIA. Descending Colon Polyp - COLONIC MUCOSA WITH BENIGN LYMPHOID AGGREGATE. Rectum, polyp(s) - TUBULAR ADENOMA (2) WITHOUT HIGH GRADE DYSPLASIA. HYPERPLASTIC POLYP (2). Recommend f/u colonoscopy in 3 years.    Hypercholesterolemia Assessment & Plan: Continue crestor .  Low cholesterol diet and exercise.  Follow lipid panel and liver function tests.  No changes today.  Lab Results  Component Value Date   CHOL 157 04/26/2024   HDL 44.30 04/26/2024   LDLCALC 71 04/26/2024   LDLDIRECT 87.0 04/03/2019   TRIG 210.0 (H) 04/26/2024   CHOLHDL 4 04/26/2024     Orders: -     Lipid panel; Future -     Hepatic function panel; Future -     Basic metabolic panel with GFR; Future  B12 deficiency Assessment & Plan: Continue B12 injections.    Dry scalp Assessment & Plan: Persistent despite using selsun shampoos, etc. Dry skin. Discussed referral to dermatology.   Orders: -     Ambulatory referral to Dermatology  Colon cancer screening Assessment & Plan: Colonoscopy 06/19/21 - multiple polyps. Recommended f/u colonoscopy in 3 years. Had f/u colonoscopy 12/2023 - recommended f/u in 3-5 years.    Essential (hemorrhagic) thrombocythemia (HCC) Assessment & Plan: Follow cbc.     Gastroesophageal reflux disease, unspecified whether esophagitis present Assessment & Plan: Continue protonix . Upper symptoms controlled.    Paroxysmal SVT (supraventricular tachycardia) (HCC) Assessment & Plan: Continue toprol . Stable.    Rheumatoid arthritis, involving unspecified site, unspecified whether rheumatoid factor present [facility]) Assessment & Plan: Seeing Dr Tobie.   On cimzia  and plaquenil .  Overall has done relatively well on this medication.  Continue f/u with rheumatology. Overall stable.    Stress Assessment & Plan: Increased stress - mother's health issues and placement issues. Stress - better at home. Seeing a Veterinary surgeon. Follow. Continue wellbutrin .    Other orders -     Cyanocobalamin ; Inject 1 mL (1,000 mcg total) into the muscle every 30 (thirty) days.  Dispense: 10 mL; Refill: 0 -     Gabapentin ; Take 3 capsules (900 mg total) by mouth every evening.  Dispense: 90 capsule; Refill: 5 -     Pantoprazole  Sodium; Take 1 tablet (20 mg total) by mouth daily.  Dispense: 90 tablet; Refill: 1 -     Rosuvastatin  Calcium ; Take 1 tablet (10 mg total) by  mouth daily.  Dispense: 90 tablet; Refill: 2 -     Syringe; Use to give b12 injections  Dispense: 50 each; Refill: 0     Allena Hamilton, MD

## 2024-05-01 NOTE — Assessment & Plan Note (Signed)
 Physical today 05/01/24.  PAP 01/29/22 - negative HPV.  Benign reactive/repair changes. Mammogram 11/03/23 - Birads I. Colonoscopy 01/04/24 - Colon, polyp(s), appendiceal orifice - SESSILE SERRATED ADENOMA (S) WITHOUT CYTOLOGIC DYSPLASIA. Transverse Colon Polyp - TUBULAR ADENOMA (S) WITHOUT HIGH GRADE DYSPLASIA. Descending Colon Polyp - COLONIC MUCOSA WITH BENIGN LYMPHOID AGGREGATE. Rectum, polyp(s) - TUBULAR ADENOMA (2) WITHOUT HIGH GRADE DYSPLASIA. HYPERPLASTIC POLYP (2). Recommend f/u colonoscopy in 3 years.

## 2024-05-02 ENCOUNTER — Other Ambulatory Visit: Payer: Self-pay

## 2024-05-02 DIAGNOSIS — F331 Major depressive disorder, recurrent, moderate: Secondary | ICD-10-CM | POA: Diagnosis not present

## 2024-05-03 ENCOUNTER — Other Ambulatory Visit: Payer: Self-pay

## 2024-05-04 ENCOUNTER — Other Ambulatory Visit: Payer: Self-pay

## 2024-05-06 ENCOUNTER — Encounter: Payer: Self-pay | Admitting: Internal Medicine

## 2024-05-06 NOTE — Assessment & Plan Note (Signed)
 Continue B12 injections.

## 2024-05-06 NOTE — Assessment & Plan Note (Signed)
 Follow cbc.

## 2024-05-06 NOTE — Assessment & Plan Note (Signed)
 Increased stress - mother's health issues and placement issues. Stress - better at home. Seeing a Veterinary surgeon. Follow. Continue wellbutrin .

## 2024-05-06 NOTE — Assessment & Plan Note (Signed)
 Seeing Dr Tobie.   On cimzia  and plaquenil .  Overall has done relatively well on this medication.  Continue f/u with rheumatology. Overall stable.

## 2024-05-06 NOTE — Assessment & Plan Note (Signed)
 Continue crestor .  Low cholesterol diet and exercise.  Follow lipid panel and liver function tests.  No changes today.  Lab Results  Component Value Date   CHOL 157 04/26/2024   HDL 44.30 04/26/2024   LDLCALC 71 04/26/2024   LDLDIRECT 87.0 04/03/2019   TRIG 210.0 (H) 04/26/2024   CHOLHDL 4 04/26/2024

## 2024-05-06 NOTE — Assessment & Plan Note (Signed)
 Continue protonix. Upper symptoms controlled.

## 2024-05-06 NOTE — Assessment & Plan Note (Signed)
Continue toprol.  Stable.  

## 2024-05-06 NOTE — Assessment & Plan Note (Signed)
 Persistent despite using selsun shampoos, etc. Dry skin. Discussed referral to dermatology.

## 2024-05-06 NOTE — Assessment & Plan Note (Signed)
 Colonoscopy 06/19/21 - multiple polyps. Recommended f/u colonoscopy in 3 years. Had f/u colonoscopy 12/2023 - recommended f/u in 3-5 years.

## 2024-05-07 ENCOUNTER — Other Ambulatory Visit: Payer: Self-pay

## 2024-05-09 DIAGNOSIS — F411 Generalized anxiety disorder: Secondary | ICD-10-CM | POA: Diagnosis not present

## 2024-05-11 ENCOUNTER — Other Ambulatory Visit: Payer: Self-pay

## 2024-05-11 ENCOUNTER — Other Ambulatory Visit: Payer: Self-pay | Admitting: Internal Medicine

## 2024-05-11 MED ORDER — FLUTICASONE PROPIONATE 50 MCG/ACT NA SUSP
2.0000 | Freq: Every day | NASAL | 3 refills | Status: DC
  Filled 2024-05-11: qty 16, 30d supply, fill #0
  Filled 2024-06-18: qty 16, 30d supply, fill #1
  Filled 2024-07-18: qty 16, 30d supply, fill #2
  Filled 2024-09-01: qty 16, 30d supply, fill #3

## 2024-05-11 MED ORDER — BUPROPION HCL ER (SR) 150 MG PO TB12
150.0000 mg | ORAL_TABLET | Freq: Two times a day (BID) | ORAL | 1 refills | Status: DC
  Filled 2024-05-11: qty 180, 90d supply, fill #0
  Filled 2024-08-01: qty 180, 90d supply, fill #1

## 2024-05-14 ENCOUNTER — Other Ambulatory Visit: Payer: Self-pay

## 2024-05-15 ENCOUNTER — Other Ambulatory Visit: Payer: Self-pay

## 2024-05-15 DIAGNOSIS — M0609 Rheumatoid arthritis without rheumatoid factor, multiple sites: Secondary | ICD-10-CM | POA: Diagnosis not present

## 2024-05-21 ENCOUNTER — Other Ambulatory Visit: Payer: Self-pay

## 2024-06-12 DIAGNOSIS — M0609 Rheumatoid arthritis without rheumatoid factor, multiple sites: Secondary | ICD-10-CM | POA: Diagnosis not present

## 2024-06-12 DIAGNOSIS — M150 Primary generalized (osteo)arthritis: Secondary | ICD-10-CM | POA: Diagnosis not present

## 2024-06-12 DIAGNOSIS — Z7960 Long term (current) use of unspecified immunomodulators and immunosuppressants: Secondary | ICD-10-CM | POA: Diagnosis not present

## 2024-06-18 ENCOUNTER — Encounter: Payer: Self-pay | Admitting: Internal Medicine

## 2024-06-19 DIAGNOSIS — M0609 Rheumatoid arthritis without rheumatoid factor, multiple sites: Secondary | ICD-10-CM | POA: Diagnosis not present

## 2024-06-22 ENCOUNTER — Telehealth: Payer: Self-pay | Admitting: Internal Medicine

## 2024-06-22 ENCOUNTER — Other Ambulatory Visit: Payer: Self-pay

## 2024-06-22 NOTE — Telephone Encounter (Signed)
 Pt dropped off application for disability parking placard. It is in Dr [employer] color folder up front.

## 2024-06-25 NOTE — Telephone Encounter (Signed)
 Patient is aware and form placd up front for pick up.

## 2024-06-25 NOTE — Telephone Encounter (Signed)
 Form completed and placed in quick sign for signature

## 2024-06-25 NOTE — Telephone Encounter (Signed)
 Signed and placed in box.

## 2024-06-26 NOTE — Telephone Encounter (Signed)
 Pt came in to pick form up today

## 2024-06-27 DIAGNOSIS — F331 Major depressive disorder, recurrent, moderate: Secondary | ICD-10-CM | POA: Diagnosis not present

## 2024-07-03 ENCOUNTER — Other Ambulatory Visit: Payer: Self-pay

## 2024-07-04 ENCOUNTER — Other Ambulatory Visit: Payer: Self-pay

## 2024-07-05 ENCOUNTER — Other Ambulatory Visit: Payer: Self-pay

## 2024-07-06 ENCOUNTER — Other Ambulatory Visit: Payer: Self-pay

## 2024-07-09 ENCOUNTER — Other Ambulatory Visit: Payer: Self-pay

## 2024-07-11 ENCOUNTER — Other Ambulatory Visit: Payer: Self-pay

## 2024-07-12 ENCOUNTER — Other Ambulatory Visit: Payer: Self-pay

## 2024-07-12 ENCOUNTER — Other Ambulatory Visit: Payer: Self-pay | Admitting: Internal Medicine

## 2024-07-13 ENCOUNTER — Other Ambulatory Visit: Payer: Self-pay

## 2024-07-13 MED ORDER — HYDROXYCHLOROQUINE SULFATE 200 MG PO TABS
200.0000 mg | ORAL_TABLET | Freq: Two times a day (BID) | ORAL | 0 refills | Status: DC
  Filled 2024-07-13: qty 180, 90d supply, fill #0

## 2024-07-17 DIAGNOSIS — M0609 Rheumatoid arthritis without rheumatoid factor, multiple sites: Secondary | ICD-10-CM | POA: Diagnosis not present

## 2024-07-25 DIAGNOSIS — M170 Bilateral primary osteoarthritis of knee: Secondary | ICD-10-CM | POA: Diagnosis not present

## 2024-07-27 DIAGNOSIS — M1811 Unilateral primary osteoarthritis of first carpometacarpal joint, right hand: Secondary | ICD-10-CM | POA: Diagnosis not present

## 2024-08-01 ENCOUNTER — Other Ambulatory Visit: Payer: Self-pay | Admitting: Cardiology

## 2024-08-01 ENCOUNTER — Other Ambulatory Visit: Payer: Self-pay

## 2024-08-01 DIAGNOSIS — M170 Bilateral primary osteoarthritis of knee: Secondary | ICD-10-CM | POA: Diagnosis not present

## 2024-08-01 MED ORDER — METOPROLOL SUCCINATE ER 25 MG PO TB24
25.0000 mg | ORAL_TABLET | Freq: Every day | ORAL | 0 refills | Status: AC
Start: 2024-08-01 — End: ?
  Filled 2024-08-01: qty 30, 30d supply, fill #0

## 2024-08-01 NOTE — Telephone Encounter (Signed)
 Please contact pt for future appointment. Pt due for follow up.

## 2024-08-02 ENCOUNTER — Other Ambulatory Visit: Payer: Self-pay

## 2024-08-02 MED ORDER — MELOXICAM 7.5 MG PO TABS
7.5000 mg | ORAL_TABLET | Freq: Two times a day (BID) | ORAL | 3 refills | Status: AC
  Filled 2024-08-02 – 2024-08-17 (×2): qty 60, 30d supply, fill #0
  Filled 2024-09-18 (×2): qty 60, 30d supply, fill #1
  Filled 2024-09-18: qty 60, 30d supply, fill #0
  Filled 2024-10-17: qty 120, 60d supply, fill #1

## 2024-08-08 ENCOUNTER — Other Ambulatory Visit: Payer: Self-pay

## 2024-08-08 DIAGNOSIS — M1811 Unilateral primary osteoarthritis of first carpometacarpal joint, right hand: Secondary | ICD-10-CM | POA: Diagnosis not present

## 2024-08-08 DIAGNOSIS — F331 Major depressive disorder, recurrent, moderate: Secondary | ICD-10-CM | POA: Diagnosis not present

## 2024-08-08 DIAGNOSIS — M170 Bilateral primary osteoarthritis of knee: Secondary | ICD-10-CM | POA: Diagnosis not present

## 2024-08-08 MED ORDER — FLUZONE 0.5 ML IM SUSY
0.5000 mL | PREFILLED_SYRINGE | Freq: Once | INTRAMUSCULAR | 0 refills | Status: AC
  Filled 2024-08-08: qty 0.5, 1d supply, fill #0

## 2024-08-17 ENCOUNTER — Other Ambulatory Visit: Payer: Self-pay

## 2024-08-21 ENCOUNTER — Other Ambulatory Visit: Payer: Self-pay | Admitting: Surgery

## 2024-08-21 DIAGNOSIS — F411 Generalized anxiety disorder: Secondary | ICD-10-CM | POA: Diagnosis not present

## 2024-08-22 ENCOUNTER — Other Ambulatory Visit: Payer: Self-pay

## 2024-08-22 DIAGNOSIS — M0560 Rheumatoid arthritis of unspecified site with involvement of other organs and systems: Secondary | ICD-10-CM | POA: Diagnosis not present

## 2024-08-22 DIAGNOSIS — Z8669 Personal history of other diseases of the nervous system and sense organs: Secondary | ICD-10-CM | POA: Diagnosis not present

## 2024-08-22 DIAGNOSIS — Z79899 Other long term (current) drug therapy: Secondary | ICD-10-CM | POA: Diagnosis not present

## 2024-08-22 DIAGNOSIS — H5213 Myopia, bilateral: Secondary | ICD-10-CM | POA: Diagnosis not present

## 2024-08-22 DIAGNOSIS — H43392 Other vitreous opacities, left eye: Secondary | ICD-10-CM | POA: Diagnosis not present

## 2024-08-22 DIAGNOSIS — H35412 Lattice degeneration of retina, left eye: Secondary | ICD-10-CM | POA: Diagnosis not present

## 2024-08-22 MED ORDER — ZOSTER VAC RECOMB ADJUVANTED 50 MCG/0.5ML IM SUSR
0.5000 mL | Freq: Once | INTRAMUSCULAR | 1 refills | Status: AC
  Filled 2024-08-22: qty 0.5, 1d supply, fill #0

## 2024-08-27 DIAGNOSIS — F411 Generalized anxiety disorder: Secondary | ICD-10-CM | POA: Diagnosis not present

## 2024-08-30 ENCOUNTER — Encounter: Payer: Self-pay | Admitting: Internal Medicine

## 2024-08-30 ENCOUNTER — Inpatient Hospital Stay: Admission: RE | Admit: 2024-08-30

## 2024-08-31 ENCOUNTER — Encounter
Admission: RE | Admit: 2024-08-31 | Discharge: 2024-08-31 | Disposition: A | Source: Ambulatory Visit | Attending: Surgery

## 2024-08-31 ENCOUNTER — Other Ambulatory Visit: Payer: Self-pay

## 2024-08-31 DIAGNOSIS — M79644 Pain in right finger(s): Secondary | ICD-10-CM

## 2024-08-31 DIAGNOSIS — R030 Elevated blood-pressure reading, without diagnosis of hypertension: Secondary | ICD-10-CM

## 2024-08-31 DIAGNOSIS — D508 Other iron deficiency anemias: Secondary | ICD-10-CM

## 2024-08-31 DIAGNOSIS — Z01818 Encounter for other preprocedural examination: Secondary | ICD-10-CM | POA: Diagnosis not present

## 2024-08-31 DIAGNOSIS — I471 Supraventricular tachycardia, unspecified: Secondary | ICD-10-CM

## 2024-08-31 DIAGNOSIS — Z01810 Encounter for preprocedural cardiovascular examination: Secondary | ICD-10-CM

## 2024-08-31 LAB — BASIC METABOLIC PANEL WITH GFR
Anion gap: 12 (ref 5–15)
BUN: 15 mg/dL (ref 6–20)
CO2: 25 mmol/L (ref 22–32)
Calcium: 9.1 mg/dL (ref 8.9–10.3)
Chloride: 106 mmol/L (ref 98–111)
Creatinine, Ser: 0.85 mg/dL (ref 0.44–1.00)
GFR, Estimated: >60 mL/min (ref >60)
Glucose, Bld: 96 mg/dL (ref 70–99)
Potassium: 3.8 mmol/L (ref 3.5–5.1)
Sodium: 143 mmol/L (ref 135–145)

## 2024-08-31 LAB — CBC
HCT: 40.3 % (ref 36.0–46.0)
Hemoglobin: 13.5 g/dL (ref 12.0–15.0)
MCH: 29.3 pg (ref 26.0–34.0)
MCHC: 33.5 g/dL (ref 30.0–36.0)
MCV: 87.6 fL (ref 80.0–100.0)
Platelets: 288 K/uL (ref 150–400)
RBC: 4.6 MIL/uL (ref 3.87–5.11)
RDW: 11.8 % (ref 11.5–15.5)
WBC: 5.6 K/uL (ref 4.0–10.5)
nRBC: 0 % (ref 0.0–0.2)

## 2024-08-31 NOTE — Telephone Encounter (Signed)
 If not getting better and persistent rash, with upcoming surgery, recommend evaluation  per note, already using hydrocortisone  cream. Would recommend keeping surgery updated.

## 2024-08-31 NOTE — Patient Instructions (Signed)
 Your procedure is scheduled on: 09/05/24 - Wednesday Report to the Registration Desk on the 1st floor of the [facility]. To find out your arrival time, please call [phone] between 1PM - 3PM on: 09/04/24 - Tuesday If your arrival time is 6:00 am, do not arrive before that time as the [facility] entrance doors do not open until 6:00 am.  REMEMBER: Instructions that are not followed completely may result in serious medical risk, up to and including death; or upon the discretion of your surgeon and anesthesiologist your surgery may need to be rescheduled.  Do not eat food after midnight the night before surgery.  No gum chewing or hard candies.  You may however, drink CLEAR liquids up to 2 hours before you are scheduled to arrive for your surgery. Do not drink anything within 2 hours of your scheduled arrival time.  Clear liquids include: - water  - apple juice without pulp - gatorade (not RED colors) - black coffee or tea (Do NOT add milk or creamers to the coffee or tea) Do NOT drink anything that is not on this list.  One week prior to surgery: Stop Anti-inflammatories (NSAIDS) such as meloxicam  (MOBIC ) ,Advil, Aleve, Ibuprofen, Motrin, Naproxen, Naprosyn and Aspirin based products such as Excedrin, Goody's Powder, BC Powder. You may continue to take Tylenol if needed for pain up until the day of surgery.  Stop ANY OVER THE COUNTER supplements until after surgery. You may continue Iron and Probiotic Product .  ON THE DAY OF SURGERY ONLY TAKE THESE MEDICATIONS WITH SIPS OF WATER:  buPROPion  (WELLBUTRIN )  hydroxychloroquine   pantoprazole  (PROTONIX )  ALPRAZolam  (XANAX ) if needed   No Alcohol for 24 hours before or after surgery.  No Smoking including e-cigarettes for 24 hours before surgery.  No chewable tobacco products for at least 6 hours before surgery.  No nicotine patches on the day of surgery.  Do not use any recreational drugs for at least a week (preferably 2  weeks) before your surgery.  Please be advised that the combination of cocaine and anesthesia may have negative outcomes, up to and including death. If you test positive for cocaine, your surgery will be cancelled.  On the morning of surgery brush your teeth with toothpaste and water, you may rinse your mouth with mouthwash if you wish. Do not swallow any toothpaste or mouthwash.  Use CHG Soap or wipes as directed on instruction sheet.  Do not wear jewelry, make-up, hairpins, clips or nail polish.  For welded (permanent) jewelry: bracelets, anklets, waist bands, etc.  Please have this removed prior to surgery.  If it is not removed, there is a chance that hospital personnel will need to cut it off on the day of surgery.  Do not wear lotions, powders, or perfumes.   Do not shave body hair from the neck down 48 hours before surgery.  Contact lenses, hearing aids and dentures may not be worn into surgery.  Do not bring valuables to the hospital. [facility] is not responsible for any missing/lost belongings or valuables.   Notify your doctor if there is any change in your medical condition (cold, fever, infection).  Wear comfortable clothing (specific to your surgery type) to the hospital.  After surgery, you can help prevent lung complications by doing breathing exercises.  Take deep breaths and cough every 1-2 hours. Your doctor may order a device called an Incentive Spirometer to help you take deep breaths.  If you are being admitted to the hospital  overnight, leave your suitcase in the car. After surgery it may be brought to your room.  In case of increased patient census, it may be necessary for you, the patient, to continue your postoperative care in the Same Day Surgery department.  If you are being discharged the day of surgery, you will not be allowed to drive home. You will need a responsible individual to drive you home and stay with you for 24 hours after surgery.   If you  are taking public transportation, you will need to have a responsible individual with you.  Please call the Pre-admissions Testing Dept. at [phone] if you have any questions about these instructions.  Surgery Visitation Policy:  Patients having surgery or a procedure may have two visitors.  Children under the age of 55 must have an adult with them who is not the patient.  Inpatient Visitation:    Visiting hours are 7 a.m. to 8 p.m. Up to four visitors are allowed at one time in a patient room. The visitors may rotate out with other people during the day.  One visitor age 61 or older may stay with the patient overnight and must be in the room by 8 p.m.   Merchandiser, Retail to address health-related social needs:  [email or web address]                                                                                                            Preparing for Surgery with CHLORHEXIDINE GLUCONATE (CHG) Soap  Chlorhexidine Gluconate (CHG) Soap  o An antiseptic cleaner that kills germs and bonds with the skin to continue killing germs even after washing  o Used for showering the night before surgery and morning of surgery  Before surgery, you can play an important role by reducing the number of germs on your skin.  CHG (Chlorhexidine gluconate) soap is an antiseptic cleanser which kills germs and bonds with the skin to continue killing germs even after washing.  Please do not use if you have an allergy to CHG or antibacterial soaps. If your skin becomes reddened/irritated stop using the CHG.  1. Shower the NIGHT BEFORE SURGERY with CHG soap.  2. If you choose to wash your hair, wash your hair first as usual with your normal shampoo.  3. After shampooing, rinse your hair and body thoroughly to remove the shampoo.  4. Use CHG as you would any other liquid soap. You can apply CHG directly to the skin and wash gently with a clean washcloth.  5. Apply the CHG  soap to your body only from the neck down. Do not use on open wounds or open sores. Avoid contact with your eyes, ears, mouth, and genitals (private parts). Wash face and genitals (private parts) with your normal soap.  6. Wash thoroughly, paying special attention to the area where your surgery will be performed.  7. Thoroughly rinse your body with warm water.  8. Do not shower/wash with your normal soap after using and rinsing off the CHG soap.  9. Do not  use lotions, oils, etc., after showering with CHG.  10. Pat yourself dry with a clean towel.  11. Wear clean pajamas to bed the night before surgery.  12. Place clean sheets on your bed the night of your shower and do not sleep with pets.  13. Do not apply any deodorants/lotions/powders.  14. Please wear clean clothes to the hospital.  15. Remember to brush your teeth with your regular toothpaste.

## 2024-09-01 ENCOUNTER — Other Ambulatory Visit: Payer: Self-pay | Admitting: Cardiology

## 2024-09-02 ENCOUNTER — Other Ambulatory Visit: Payer: Self-pay

## 2024-09-03 ENCOUNTER — Other Ambulatory Visit: Payer: Self-pay | Admitting: Cardiology

## 2024-09-03 ENCOUNTER — Other Ambulatory Visit

## 2024-09-03 ENCOUNTER — Other Ambulatory Visit: Payer: Self-pay

## 2024-09-03 DIAGNOSIS — F411 Generalized anxiety disorder: Secondary | ICD-10-CM | POA: Diagnosis not present

## 2024-09-04 MED ORDER — LACTATED RINGERS IV SOLN: INTRAVENOUS | Status: DC

## 2024-09-04 MED ORDER — CEFAZOLIN SODIUM-DEXTROSE 2-4 GM/100ML-% IV SOLN
2.0000 g | INTRAVENOUS | Status: AC
  Administered 2024-09-05: 09:00:00 2 g via INTRAVENOUS

## 2024-09-04 MED ORDER — CHLORHEXIDINE GLUCONATE 0.12 % MT SOLN
15.0000 mL | Freq: Once | OROMUCOSAL | Status: AC
  Administered 2024-09-05: 08:00:00 15 mL via OROMUCOSAL

## 2024-09-04 MED ORDER — ORAL CARE MOUTH RINSE: 15.0000 mL | Freq: Once | OROMUCOSAL | Status: AC

## 2024-09-05 ENCOUNTER — Other Ambulatory Visit: Payer: Self-pay

## 2024-09-05 ENCOUNTER — Ambulatory Visit: Admission: RE | Admit: 2024-09-05 | Discharge: 2024-09-05 | Disposition: A | Attending: Surgery | Admitting: Surgery

## 2024-09-05 ENCOUNTER — Other Ambulatory Visit: Payer: Self-pay | Admitting: Internal Medicine

## 2024-09-05 ENCOUNTER — Encounter: Admission: RE | Disposition: A | Payer: Self-pay | Source: Home / Self Care | Attending: Surgery

## 2024-09-05 ENCOUNTER — Encounter: Payer: Self-pay | Admitting: Surgery

## 2024-09-05 ENCOUNTER — Ambulatory Visit: Admitting: Anesthesiology

## 2024-09-05 ENCOUNTER — Ambulatory Visit

## 2024-09-05 DIAGNOSIS — E7800 Pure hypercholesterolemia, unspecified: Secondary | ICD-10-CM | POA: Diagnosis not present

## 2024-09-05 DIAGNOSIS — M1811 Unilateral primary osteoarthritis of first carpometacarpal joint, right hand: Secondary | ICD-10-CM | POA: Diagnosis not present

## 2024-09-05 DIAGNOSIS — Z8249 Family history of ischemic heart disease and other diseases of the circulatory system: Secondary | ICD-10-CM | POA: Insufficient documentation

## 2024-09-05 DIAGNOSIS — K219 Gastro-esophageal reflux disease without esophagitis: Secondary | ICD-10-CM | POA: Diagnosis not present

## 2024-09-05 DIAGNOSIS — D649 Anemia, unspecified: Secondary | ICD-10-CM | POA: Insufficient documentation

## 2024-09-05 DIAGNOSIS — F419 Anxiety disorder, unspecified: Secondary | ICD-10-CM | POA: Diagnosis not present

## 2024-09-05 DIAGNOSIS — Z8261 Family history of arthritis: Secondary | ICD-10-CM | POA: Diagnosis not present

## 2024-09-05 DIAGNOSIS — I471 Supraventricular tachycardia, unspecified: Secondary | ICD-10-CM | POA: Diagnosis not present

## 2024-09-05 DIAGNOSIS — F32A Depression, unspecified: Secondary | ICD-10-CM | POA: Diagnosis not present

## 2024-09-05 DIAGNOSIS — Z79899 Other long term (current) drug therapy: Secondary | ICD-10-CM | POA: Insufficient documentation

## 2024-09-05 HISTORY — PX: CARPOMETACARPAL (CMC) FUSION OF THUMB: SHX6290

## 2024-09-05 SURGERY — CARPOMETACARPAL (CMC) FUSION OF THUMB
Anesthesia: General | Site: Thumb

## 2024-09-05 MED ORDER — KETOROLAC TROMETHAMINE 30 MG/ML IJ SOLN
INTRAMUSCULAR | Status: DC | PRN
  Administered 2024-09-05: 11:00:00 30 mg via INTRAVENOUS

## 2024-09-05 MED ORDER — ROCURONIUM BROMIDE 100 MG/10ML IV SOLN
INTRAVENOUS | Status: DC | PRN
  Administered 2024-09-05: 09:00:00 50 mg via INTRAVENOUS

## 2024-09-05 MED ORDER — ACETAMINOPHEN 10 MG/ML IV SOLN
INTRAVENOUS | Status: DC | PRN
  Administered 2024-09-05: 10:00:00 1000 mg via INTRAVENOUS

## 2024-09-05 MED ORDER — DEXAMETHASONE SOD PHOSPHATE PF 10 MG/ML IJ SOLN
INTRAMUSCULAR | Status: DC | PRN
  Administered 2024-09-05: 09:00:00 10 mg via INTRAVENOUS
  Administered 2024-09-05: 09:00:00 2 mg via INTRAVENOUS

## 2024-09-05 MED ORDER — ONDANSETRON HCL 4 MG/2ML IJ SOLN: 4.0000 mg | Freq: Four times a day (QID) | INTRAMUSCULAR | Status: DC | PRN

## 2024-09-05 MED ORDER — PROPOFOL 10 MG/ML IV BOLUS
INTRAVENOUS | Status: AC
  Filled 2024-09-05: qty 20

## 2024-09-05 MED ORDER — BUPIVACAINE HCL (PF) 0.5 % IJ SOLN
INTRAMUSCULAR | Status: AC
  Filled 2024-09-05: qty 30

## 2024-09-05 MED ORDER — 0.9 % SODIUM CHLORIDE (POUR BTL) OPTIME
TOPICAL | Status: DC | PRN
  Administered 2024-09-05: 10:00:00 500 mL

## 2024-09-05 MED ORDER — FENTANYL CITRATE (PF) 100 MCG/2ML IJ SOLN
INTRAMUSCULAR | Status: DC | PRN
  Administered 2024-09-05 (×2): 50 ug via INTRAVENOUS

## 2024-09-05 MED ORDER — HYDROXYCHLOROQUINE SULFATE 200 MG PO TABS: 400.0000 mg | ORAL_TABLET | Freq: Every day | ORAL | Status: AC

## 2024-09-05 MED ORDER — FENTANYL CITRATE (PF) 100 MCG/2ML IJ SOLN
INTRAMUSCULAR | Status: AC
  Filled 2024-09-05: qty 2

## 2024-09-05 MED ORDER — CHLORHEXIDINE GLUCONATE 0.12 % MT SOLN
OROMUCOSAL | Status: AC
  Filled 2024-09-05: qty 15

## 2024-09-05 MED ORDER — HYDROCODONE-ACETAMINOPHEN 5-325 MG PO TABS: 1.0000 | ORAL_TABLET | Freq: Four times a day (QID) | ORAL | 0 refills | Status: AC | PRN

## 2024-09-05 MED ORDER — OXYCODONE HCL 5 MG PO TABS
ORAL_TABLET | ORAL | Status: AC
  Filled 2024-09-05: qty 1

## 2024-09-05 MED ORDER — ACETAMINOPHEN 325 MG PO TABS: 325.0000 mg | ORAL_TABLET | Freq: Four times a day (QID) | ORAL | Status: DC | PRN

## 2024-09-05 MED ORDER — MIDAZOLAM HCL (PF) 2 MG/2ML IJ SOLN
INTRAMUSCULAR | Status: DC | PRN
  Administered 2024-09-05: 09:00:00 2 mg via INTRAVENOUS

## 2024-09-05 MED ORDER — PROPOFOL 500 MG/50ML IV EMUL
INTRAVENOUS | Status: DC | PRN
  Administered 2024-09-05: 09:00:00 20 ug/kg/min via INTRAVENOUS

## 2024-09-05 MED ORDER — LIDOCAINE HCL (CARDIAC) PF 100 MG/5ML IV SOSY
PREFILLED_SYRINGE | INTRAVENOUS | Status: DC | PRN
  Administered 2024-09-05: 09:00:00 80 mg via INTRAVENOUS

## 2024-09-05 MED ORDER — HYDROCODONE-ACETAMINOPHEN 5-325 MG PO TABS: 1.0000 | ORAL_TABLET | ORAL | Status: DC | PRN

## 2024-09-05 MED ORDER — CEFAZOLIN SODIUM-DEXTROSE 2-4 GM/100ML-% IV SOLN
INTRAVENOUS | Status: AC
  Filled 2024-09-05: qty 100

## 2024-09-05 MED ORDER — ROCURONIUM BROMIDE 10 MG/ML (PF) SYRINGE
PREFILLED_SYRINGE | INTRAVENOUS | Status: AC
  Filled 2024-09-05: qty 10

## 2024-09-05 MED ORDER — ONDANSETRON HCL 4 MG/2ML IJ SOLN
INTRAMUSCULAR | Status: DC | PRN
  Administered 2024-09-05: 10:00:00 4 mg via INTRAVENOUS

## 2024-09-05 MED ORDER — FLUTICASONE PROPIONATE 50 MCG/ACT NA SUSP: 2.0000 | Freq: Two times a day (BID) | NASAL | Status: AC

## 2024-09-05 MED ORDER — KETOROLAC TROMETHAMINE 30 MG/ML IJ SOLN: 30.0000 mg | Freq: Once | INTRAMUSCULAR | Status: DC

## 2024-09-05 MED ORDER — METOCLOPRAMIDE HCL 5 MG/ML IJ SOLN: 5.0000 mg | Freq: Three times a day (TID) | INTRAMUSCULAR | Status: DC | PRN

## 2024-09-05 MED ORDER — SODIUM CHLORIDE 0.9 % IV SOLN: INTRAVENOUS | Status: DC

## 2024-09-05 MED ORDER — MIDAZOLAM HCL 2 MG/2ML IJ SOLN
INTRAMUSCULAR | Status: AC
  Filled 2024-09-05: qty 2

## 2024-09-05 MED ORDER — ONDANSETRON HCL 4 MG PO TABS: 4.0000 mg | ORAL_TABLET | Freq: Four times a day (QID) | ORAL | Status: DC | PRN

## 2024-09-05 MED ORDER — SUGAMMADEX SODIUM 200 MG/2ML IV SOLN
INTRAVENOUS | Status: DC | PRN
  Administered 2024-09-05: 11:00:00 200 mg via INTRAVENOUS

## 2024-09-05 MED ORDER — OXYCODONE HCL 5 MG PO TABS
5.0000 mg | ORAL_TABLET | Freq: Once | ORAL | Status: AC | PRN
  Administered 2024-09-05: 11:00:00 5 mg via ORAL

## 2024-09-05 MED ORDER — LIDOCAINE HCL (PF) 2 % IJ SOLN
INTRAMUSCULAR | Status: AC
  Filled 2024-09-05: qty 5

## 2024-09-05 MED ORDER — PROPOFOL 1000 MG/100ML IV EMUL
INTRAVENOUS | Status: AC
  Filled 2024-09-05: qty 100

## 2024-09-05 MED ORDER — FENTANYL CITRATE (PF) 100 MCG/2ML IJ SOLN: 25.0000 ug | INTRAMUSCULAR | Status: DC | PRN

## 2024-09-05 MED ORDER — HYDROCODONE-ACETAMINOPHEN 5-325 MG PO TABS
1.0000 | ORAL_TABLET | Freq: Four times a day (QID) | ORAL | 0 refills | Status: DC | PRN
  Filled 2024-09-05: qty 30, 4d supply, fill #0

## 2024-09-05 MED ORDER — OXYCODONE HCL 5 MG/5ML PO SOLN: 5.0000 mg | Freq: Once | ORAL | Status: AC | PRN

## 2024-09-05 MED ORDER — METOCLOPRAMIDE HCL 10 MG PO TABS: 5.0000 mg | ORAL_TABLET | Freq: Three times a day (TID) | ORAL | Status: DC | PRN

## 2024-09-05 MED ORDER — ACETAMINOPHEN 10 MG/ML IV SOLN
INTRAVENOUS | Status: AC
  Filled 2024-09-05: qty 100

## 2024-09-05 MED ORDER — ONDANSETRON HCL 4 MG/2ML IJ SOLN
INTRAMUSCULAR | Status: AC
  Filled 2024-09-05: qty 2

## 2024-09-05 MED ORDER — KETOROLAC TROMETHAMINE 30 MG/ML IJ SOLN
INTRAMUSCULAR | Status: AC
  Filled 2024-09-05: qty 1

## 2024-09-05 MED ADMIN — Bupivacaine HCl Preservative Free (PF) Inj 0.5%: 10 mL | @ 11:00:00 | NDC 63323046601

## 2024-09-05 MED ADMIN — PROPOFOL 200 MG/20ML IV EMUL: 200 mg | INTRAVENOUS | @ 09:00:00 | NDC 00069020901

## 2024-09-05 MED FILL — Metoprolol Succinate Tab ER 24HR 25 MG (Tartrate Equiv): ORAL | 15 days supply | Qty: 15 | Fill #0 | Status: AC

## 2024-09-05 SURGICAL SUPPLY — 37 items
BENZOIN TINCTURE PRP APPL 2/3 (GAUZE/BANDAGES/DRESSINGS) ×1 IMPLANT
BLADE DEBAKEY 8.0 (BLADE) ×1 IMPLANT
BLADE OSC/SAGITTAL 5.5X25 (BLADE) IMPLANT
BLADE OSC/SAGITTAL MD 9X18.5 (BLADE) ×1 IMPLANT
BLADE SURG 15 STRL LF DISP TIS (BLADE) ×2 IMPLANT
BNDG COHESIVE 4X5 TAN STRL LF (GAUZE/BANDAGES/DRESSINGS) ×1 IMPLANT
BNDG ELASTIC 3INX 5YD STR LF (GAUZE/BANDAGES/DRESSINGS) ×1 IMPLANT
BNDG ESMARCH 4X12 STRL LF (GAUZE/BANDAGES/DRESSINGS) ×1 IMPLANT
BNDG STRETCH GAUZE 3IN X12FT (GAUZE/BANDAGES/DRESSINGS) ×1 IMPLANT
CHLORAPREP W/TINT 26 (MISCELLANEOUS) ×1 IMPLANT
CUFF TOURN SGL QUICK 18X4 (TOURNIQUET CUFF) ×1 IMPLANT
DRAPE FLUOR MINI C-ARM 54X84 (DRAPES) ×1 IMPLANT
FORCEPS JEWEL BIP 4-3/4 STR (INSTRUMENTS) ×1 IMPLANT
GAUZE XEROFORM 1X8 LF (GAUZE/BANDAGES/DRESSINGS) ×1 IMPLANT
GLOVE BIO SURGEON STRL SZ8 (GLOVE) ×2 IMPLANT
GLOVE BIOGEL PI IND STRL 8 (GLOVE) ×1 IMPLANT
GOWN STRL REUS W/ TWL XL LVL3 (GOWN DISPOSABLE) ×1 IMPLANT
HOLSTER ELECTROSUGICAL PENCIL (MISCELLANEOUS) IMPLANT
KIT TURNOVER KIT A (KITS) ×1 IMPLANT
MANIFOLD NEPTUNE II (INSTRUMENTS) ×1 IMPLANT
NS IRRIG 500ML POUR BTL (IV SOLUTION) ×1 IMPLANT
PACK EXTREMITY ARMC (MISCELLANEOUS) ×1 IMPLANT
PAD CAST 3X4 CTTN HI CHSV (CAST SUPPLIES) ×2 IMPLANT
PASSER SUT SWANSON 36MM LOOP (INSTRUMENTS) IMPLANT
SOLN STERILE WATER 500 ML (IV SOLUTION) ×1 IMPLANT
SPLINT CAST 1 STEP 3X12 (MISCELLANEOUS) ×1 IMPLANT
STOCKINETTE 48X4 2 PLY STRL (GAUZE/BANDAGES/DRESSINGS) ×1 IMPLANT
STOCKINETTE IMPERVIOUS 9X36 MD (GAUZE/BANDAGES/DRESSINGS) ×1 IMPLANT
STRIP CLOSURE SKIN 1/4X4 (GAUZE/BANDAGES/DRESSINGS) ×1 IMPLANT
SUT ETHIBOND 0 MO6 C/R (SUTURE) ×1 IMPLANT
SUT PROLENE 4 0 PS 2 18 (SUTURE) ×1 IMPLANT
SUT VIC AB 2-0 CT2 27 (SUTURE) ×1 IMPLANT
SUT VIC AB 2-0 SH 27XBRD (SUTURE) ×1 IMPLANT
SUT VIC AB 3-0 SH 27X BRD (SUTURE) ×1 IMPLANT
SYSTEM IMPLANT TIGHTROPE MINI (Anchor) IMPLANT
TRAP FLUID SMOKE EVACUATOR (MISCELLANEOUS) ×1 IMPLANT
WIRE Z .062 C-WIRE SPADE TIP (WIRE) IMPLANT

## 2024-09-05 NOTE — Anesthesia Postprocedure Evaluation (Signed)
 Anesthesia Post Note  Patient: Tamara Shah  Procedure(s) Performed: CARPOMETACARPEL (CMC) FUSION OF THUMB WITH AUTOGRAFT FROM RADIUS (Right: Thumb)  Patient location during evaluation: PACU Anesthesia Type: General Level of consciousness: awake and alert Pain management: pain level controlled Vital Signs Assessment: post-procedure vital signs reviewed and stable Respiratory status: spontaneous breathing, nonlabored ventilation, respiratory function stable and patient connected to nasal cannula oxygen Cardiovascular status: blood pressure returned to baseline and stable Postop Assessment: no apparent nausea or vomiting Anesthetic complications: no   No notable events documented.   Last Vitals:  Vitals:   09/05/24 1142 09/05/24 1158  BP: 135/80 138/82  Pulse: (!) 57 62  Resp: 14 17  Temp: (!) 36.1 C (!) 36.1 C  SpO2: 96% 97%    Last Pain:  Vitals:   09/05/24 1158  TempSrc: Temporal  PainSc: 2                  [name]

## 2024-09-05 NOTE — Op Note (Signed)
 09/05/2024  11:04 AM  Patient:   Tamara Shah  Pre-Op Diagnosis:   Degenerative joint disease of right thumb CMC joint.  Post-Op Diagnosis:   Same.  Procedure:   Suspension arthroplasty right thumb CMC joint.   Surgeon:   DOROTHA Reyes Maltos, MD  Assistant:   None  Anesthesia:   GET  Findings:   As above.  Complications:   None  EBL:   0 cc  Fluids:   500 cc crystalloid  TT:   71 minutes at 250 mmHg  Drains:   None  Closure:   3-0 Vicryl subcuticular sutures  Implants:   Arthrex 1.1 mm CMC Mini Tightrope.  Brief Clinical Note:   The patient is a 53 year old female with a history of progressive worsening basilar right thumb pain. The patient's symptoms have progressed despite medications, activity modification, injections, splinting, etc. The patient's history and examination are consistent with advanced degenerative joint disease of the right thumb CMC joint which was confirmed by plain radiographs. The patient presents at this time for a suspension arthroplasty of the right thumb CMC joint.  Procedure:    The patient was brought into the operating room and lain in the supine position. After adequate general endotracheal intubation and anesthesia was obtained, the patient's right hand and upper extremity were prepped with ChloraPrep solution before being draped sterilely. Preoperative antibiotics were administered. A timeout was performed to verify the appropriate surgical site before the limb was exsanguinated with an Esmarch and the tourniquet inflated to 250 mmHg.   An approximately 4-5 cm longitudinal incision was made centered over the radial aspect of the thumb CMC joint. The incision was carried down through the subcutaneous cutaneous tissues with care taken to identify and protect the local neurovascular structures. Several small veins were cauterized with bipolar electrocautery. A longitudinal incision was made through the capsular tissues over the dorsal aspect of the  thumb CMC joint. Subperiosteal dissection was carried out to expose the trapezium dorsally and volarly. Once the [facility] and scaphotrapezial joints were identified, a sagittal cut was made in the trapezium using a Hoke osteotome. The bone was then removed piecemeal using rongeurs and further dissection. The flexor carpi radialis tendon was identified at the base of the defect. The base of the thumb metacarpal was prepared by exposing its radial base.  The Arthrex 1.1 mm guidewire was drilled up through the proximal end of the thumb metacarpal beginning at the dorsoradial surface and advancing into the volar radial aspect of the proximal index metacarpal shaft. The adequacy of pin position was verified using FluoroScan imaging in AP, lateral, and oblique projections and found to be excellent.  An approximate 1.5 cm incision was made over the dorsal aspect of the proximal end of the index metacarpal. The incision was carried down through the subcutaneous tissues with care taken to avoid damaging the extensor tendon. The interosseous muscle was dissected away from the dorso-ulnar aspect of the proximal index metacarpal shaft before the guidewire was advanced through the final cortex into this wound. The suture was passed through the nitinol wire loop and pulled across the thumb and index metacarpals, seating the mini-Endobutton against the proximal end of the thumb metacarpal. The suture was cut and each and passed through the second mini-Endobutton. The Endobutton was advanced to rest against the dorso-ulnar aspect of the proximal index metacarpal shaft before it was tied securely with appropriate tension to stabilize the base of the thumb metacarpal against the base of the index metacarpal.  Again, the adequacy of Endobutton position and metacarpal reduction was verified fluoroscopically in AP, lateral, and oblique projections and found to be excellent. The thumb was then placed through a range of motion. It was able  to be abducted and adducted fully and remained distracted to gentle axial loading.  The wounds were copiously irrigated with sterile saline solution using bulb irrigation. The thumb CMC capsular tissues were reapproximated using 2-0 Vicryl interrupted sutures before the skin was closed using 3-0 Vicryl subcuticular interrupted sutures. Benzoin and Steri-Strips were applied to the skin. The skin of the more dorsal incision was closed using 3-0 Vicryl subcuticular sutures before benzoin and Steri-Strips were applied over this wound as well. A total of 10 cc of 0.5% plain Sensorcaine  was injected in and around both incisions to help with postoperative analgesia. A sterile bulky dressing was applied to the wounds before the patient was placed into a fiberglass thumb spica splint maintaining the thumb in the position of function. The patient was then awakened, extubated, and returned to the recovery room in satisfactory condition after tolerating the procedure well.

## 2024-09-05 NOTE — H&P (Signed)
 Tamara Shah is a 53 y.o. female who presents today for her surgical history and physical for right thumb CMC arthroplasty. Surgery scheduled with Dr. Edie on 09/05/2024. The patient denies any changes to her ongoing right thumb pain. She denies any changes in her medical history since her last evaluation. She denies any personal history of heart attack, stroke, asthma or COPD. No personal history of blood clot. She is not diabetic.  Past Medical History: Allergic rhinitis  Allergy  History of chicken pox  History of UTI  Hypercholesterolemia  Hyperlipidemia  Osteoarthritis  Recurrent sinusitis  Seronegative arthritis   Past Surgical History: AUGMENTATION MAMMAPLASTY 2001  tummy tuck 2001  CESAREAN SECTION 07/09   Past Family History: High blood pressure (Hypertension) Mother  High blood pressure (Hypertension) Father  Atrial fibrillation (Abnormal heart rhythm sometimes requiring treatment with blood thinners) Father  Cancer Father  High blood pressure (Hypertension) Sister  Asthma Sister  Arthritis Sister  Rheum arthritis Maternal Grandfather   Medications: acetaminophen  (TYLENOL  8 HOUR ORAL) Take by mouth as needed  ALPRAZolam  (XANAX ) 0.25 MG tablet Take 1 tablet by mouth once daily as needed  buPROPion  (WELLBUTRIN  SR) 150 MG SR tablet Take 1 tablet by mouth 2 (two) times daily  certolizumab pegoL  (CIMZIA ) 400 mg/2 mL (200 mg/mL x 2) injection kit Inject 400 mg subcutaneously every 28 (twenty-eight) days  cetirizine  HCl (ZYRTEC  ORAL) Take by mouth once daily  cyanocobalamin  (VITAMIN B12) 1,000 mcg/mL injection Inject into the muscle monthly Weekly for 4 weeks and then monthly.  famotidine (PEPCID) 20 MG tablet Take 1 tablet (20 mg total) by mouth 2 (two) times daily  ferrous sulfate  325 (65 FE) MG tablet Take 325 mg by mouth daily with breakfast  fluticasone  (FLONASE ) 50 mcg/actuation nasal spray Place 1 spray into both nostrils 2 (two) times daily. 16 g 0  gabapentin   (NEURONTIN ) 300 MG capsule Take 3 capsules by mouth nightly  hydroxychloroquine  (PLAQUENIL ) 200 mg tablet TAKE 1 TABLET BY MOUTH TWICE DAILY FOR INFLAMMATORY ARTHRITIS 180 tablet 1  MELATONIN ORAL Take by mouth nightly  meloxicam  (MOBIC ) 7.5 MG tablet Take 1 tablet (7.5 mg total) by mouth 2 (two) times daily. 60 tablet 3  metoprolol  succinate (TOPROL -XL) 25 MG XL tablet Take 1 tablet by mouth once daily  pantoprazole  (PROTONIX ) 20 MG DR tablet Take 20 mg by mouth once daily  pantoprazole  (PROTONIX ) 40 MG DR tablet Take 40 mg by mouth once daily  rosuvastatin  (CRESTOR ) 10 MG tablet Take 1 tablet by mouth once daily   Allergies: No Known Allergies   Review of Systems:  A comprehensive 14 point ROS was performed, reviewed by me today, and the pertinent orthopaedic findings are documented in the HPI.  Physical Exam: BP 128/80  Ht 157.5 cm (5' 2)  Wt 98.2 kg (216 lb 9.6 oz)  BMI 39.62 kg/m  General/Constitutional: The patient appears to be well-nourished, well-developed, and in no acute distress. Neuro/Psych: Normal mood and affect, oriented to person, place and time. Eyes: Non-icteric. Pupils are equal, round, and reactive to light, and exhibit synchronous movement. ENT: Unremarkable. Lymphatic: No palpable adenopathy. Respiratory: Non-labored breathing Cardiovascular: No edema, swelling or tenderness, except as noted in detailed exam. Integumentary: No impressive skin lesions present, except as noted in detailed exam. Musculoskeletal: Unremarkable, except as noted in detailed exam.  Right thumb exam: Skin inspection of the right thumb and hand is notable for mild swelling around the base of the thumb, but otherwise is unremarkable. No erythema, ecchymosis, abrasions,  or other skin abnormalities are identified. She has mild-moderate tenderness to palpation over the base of the thumb, but there are no other areas of tenderness around the thumb or hand. She is able to active flex and  extend all digits without any pain or triggering. However, she has a positive grind test to the thumb CMC joint. She is neurovascularly intact all digits.  Imaging: AP, lateral, and oblique views of the right thumb are obtained. These films demonstrate advanced degenerative changes of the [facility] joint as manifested by loss of joint space, osteophyte formation, and subchondral cystic changes. There also is subluxation of the right thumb metacarpal in relation to the trapezium. No lytic lesions are fractures are identified.   Impression: 1. Primary osteoarthritis of first carpometacarpal joint of right hand.  Plan:  1. Treatment options were discussed today with the patient. 2. The patient was instructed on the risk and benefits of surgical intervention at today's visit. The patient is scheduled to undergo a right thumb [facility] arthroplasty with Dr. Edie on 09/05/2024. 3. This document will serve as a surgical history and physical for the patient.  The procedure was discussed with the patient, as were the potential risks (including bleeding, infection, nerve and/or blood vessel injury, persistent or recurrent pain, failure of the repair, progression of arthritis, need for further surgery, blood clots, strokes, heart attacks and/or arhythmias, pneumonia, etc.) and benefits. The patient states her understanding and wishes to proceed.    H&P reviewed and patient re-examined. No changes.

## 2024-09-05 NOTE — Discharge Instructions (Addendum)
 Orthopedic discharge instructions: Keep splint dry and intact. Keep hand elevated above heart level. Apply ice to affected area frequently. Resume Mobic  7.5 mg twice daily OR take ibuprofen 600-800 mg TID with meals for 3-5 days, then as necessary. Take pain medication as prescribed or ES Tylenol  when needed.  Return for follow-up in 10-14 days or as scheduled.

## 2024-09-05 NOTE — Anesthesia Preprocedure Evaluation (Signed)
 Anesthesia Evaluation  Patient identified by MRN, date of birth, ID band Patient awake    Reviewed: Allergy & Precautions, NPO status , Patient's Chart, lab work & pertinent test results  History of Anesthesia Complications (+) PONV and history of anesthetic complications  Airway Mallampati: III  TM Distance: >3 FB Neck ROM: full    Dental no notable dental hx.    Pulmonary neg pulmonary ROS   Pulmonary exam normal        Cardiovascular + dysrhythmias Supra Ventricular Tachycardia      Neuro/Psych  PSYCHIATRIC DISORDERS Anxiety Depression    negative neurological ROS     GI/Hepatic Neg liver ROS,GERD  Medicated,,  Endo/Other  negative endocrine ROS    Renal/GU negative Renal ROS  negative genitourinary   Musculoskeletal   Abdominal   Peds  Hematology  (+) Blood dyscrasia, anemia   Anesthesia Other Findings Past Medical History: No date: Allergy No date: Anemia No date: Anxiety No date: Arthritis No date: Blood in stool     Comment:  H/O No date: Complication of anesthesia No date: Depression No date: GERD (gastroesophageal reflux disease) No date: History of chicken pox No date: Hx: UTI (urinary tract infection) No date: Hyperlipidemia No date: PONV (postoperative nausea and vomiting)  Past Surgical History: 09/21/1999: AUGMENTATION MAMMAPLASTY 2007 and 2009: CESAREAN SECTION 01/04/2024: COLONOSCOPY; N/A     Comment:  Procedure: COLONOSCOPY;  Surgeon: Therisa Bi, MD;                Location: [facility] ENDOSCOPY;  Service: Gastroenterology;                Laterality: N/A; 06/19/2021: COLONOSCOPY WITH PROPOFOL ; N/A     Comment:  Procedure: COLONOSCOPY WITH PROPOFOL ;  Surgeon:               Janalyn Keene NOVAK, MD;  Location: [facility] ENDOSCOPY;                Service: Endoscopy;  Laterality: N/A; No date: COSMETIC SURGERY No date: DILATION AND CURETTAGE OF UTERUS No date: finger surgery tumor removed;  Right     Comment:  index 09/21/1999: tummy tuck No date: WISDOM TOOTH EXTRACTION  BMI    Body Mass Index: 39.32 kg/m      Reproductive/Obstetrics negative OB ROS                              Anesthesia Physical Anesthesia Plan  ASA: 3  Anesthesia Plan: General ETT   Post-op Pain Management: Toradol  IV (intra-op)* and Ofirmev  IV (intra-op)*   Induction: Intravenous  PONV Risk Score and Plan: 3 and Ondansetron , Dexamethasone , Midazolam , Treatment may vary due to age or medical condition, Propofol  infusion and TIVA  Airway Management Planned: Oral ETT  Additional Equipment:   Intra-op Plan:   Post-operative Plan: Extubation in OR  Informed Consent: I have reviewed the patients History and Physical, chart, labs and discussed the procedure including the risks, benefits and alternatives for the proposed anesthesia with the patient or authorized representative who has indicated his/her understanding and acceptance.     Dental Advisory Given  Plan Discussed with: Anesthesiologist, CRNA and Surgeon  Anesthesia Plan Comments: (Patient consented for risks of anesthesia including but not limited to:  - adverse reactions to medications - damage to eyes, teeth, lips or other oral mucosa - nerve damage due to positioning  - sore throat or hoarseness - Damage to heart, brain, nerves,  lungs, other parts of body or loss of life  Patient voiced understanding and assent.)         Anesthesia Quick Evaluation

## 2024-09-05 NOTE — Transfer of Care (Signed)
 Immediate Anesthesia Transfer of Care Note  Patient: Tamara Shah  Procedure(s) Performed: CARPOMETACARPEL (CMC) FUSION OF THUMB WITH AUTOGRAFT FROM RADIUS (Right: Thumb)  Patient Location: PACU  Anesthesia Type:General  Level of Consciousness: drowsy  Airway & Oxygen Therapy: Patient Spontanous Breathing  Post-op Assessment: Report given to RN and Post -op Vital signs reviewed and stable  Post vital signs: Reviewed and stable   Last Vitals:  Vitals Value Taken Time  BP 125/89 09/05/24 11:01  Temp    Pulse 71 09/05/24 11:06  Resp 14 09/05/24 11:06  SpO2 98 % 09/05/24 11:06  Vitals shown include unfiled device data.  Last Pain:  Vitals:   09/05/24 0748  TempSrc: Temporal  PainSc: 3          Complications: No notable events documented.

## 2024-09-05 NOTE — Anesthesia Procedure Notes (Addendum)
 Procedure Name: Intubation Date/Time: 09/05/2024 9:21 AM  Performed by: Mahlia Fernando, CRNAPre-anesthesia Checklist: Patient identified, Emergency Drugs available, Suction available and Patient being monitored Patient Re-evaluated:Patient Re-evaluated prior to induction Oxygen Delivery Method: Circle System Utilized Preoxygenation: Pre-oxygenation with 100% oxygen Induction Type: IV induction Ventilation: Mask ventilation without difficulty Laryngoscope Size: McGrath and 4 Grade View: Grade I Tube type: Oral Tube size: 6.5 mm Number of attempts: 1 Airway Equipment and Method: Stylet and Oral airway Placement Confirmation: ETT inserted through vocal cords under direct vision, positive ETCO2 and breath sounds checked- equal and bilateral Secured at: 21 cm Tube secured with: Tape Dental Injury: Teeth and Oropharynx as per pre-operative assessment  Comments: Easy, atraumatic intubation, head and neck midline. Lips, teeth, and tongue unchanged.

## 2024-09-06 ENCOUNTER — Other Ambulatory Visit: Payer: Self-pay

## 2024-09-06 ENCOUNTER — Encounter: Payer: Self-pay | Admitting: Surgery

## 2024-09-06 ENCOUNTER — Ambulatory Visit: Admitting: Internal Medicine

## 2024-09-18 ENCOUNTER — Other Ambulatory Visit: Payer: Self-pay

## 2024-09-18 ENCOUNTER — Other Ambulatory Visit: Payer: Self-pay | Admitting: Internal Medicine

## 2024-09-18 ENCOUNTER — Other Ambulatory Visit (HOSPITAL_COMMUNITY): Payer: Self-pay

## 2024-09-18 MED ORDER — HYDROXYCHLOROQUINE SULFATE 200 MG PO TABS
200.0000 mg | ORAL_TABLET | Freq: Two times a day (BID) | ORAL | 1 refills | Status: AC
  Filled 2024-09-18 – 2024-10-16 (×3): qty 180, 90d supply, fill #0

## 2024-09-19 ENCOUNTER — Other Ambulatory Visit: Payer: Self-pay

## 2024-09-19 ENCOUNTER — Other Ambulatory Visit: Payer: Self-pay | Admitting: Internal Medicine

## 2024-09-19 ENCOUNTER — Other Ambulatory Visit: Payer: Self-pay | Admitting: Cardiology

## 2024-09-19 MED ORDER — FLUTICASONE PROPIONATE 50 MCG/ACT NA SUSP
2.0000 | Freq: Every day | NASAL | 1 refills | Status: AC
  Filled 2024-09-19: qty 16, 30d supply, fill #0
  Filled 2024-10-16: qty 16, 30d supply, fill #1

## 2024-09-19 NOTE — Telephone Encounter (Signed)
 Rx ok'd for flonase.

## 2024-09-21 ENCOUNTER — Other Ambulatory Visit: Payer: Self-pay

## 2024-09-21 ENCOUNTER — Other Ambulatory Visit (HOSPITAL_COMMUNITY): Payer: Self-pay

## 2024-09-26 ENCOUNTER — Other Ambulatory Visit: Payer: Self-pay | Admitting: Internal Medicine

## 2024-09-26 DIAGNOSIS — Z1231 Encounter for screening mammogram for malignant neoplasm of breast: Secondary | ICD-10-CM

## 2024-09-26 NOTE — Progress Notes (Signed)
 "  Cardiology Clinic Note   Date: 09/28/2024 ID: AMBRIE CARTE, DOB 09-29-1970, MRN [id number]  Primary Cardiologist:  Redell Cave, MD  Chief Complaint   Tamara Shah is a 54 y.o. female who presents to the clinic today for routine follow up.   Patient Profile   Tamara Shah is followed by Dr. Cave for the history outlined below.      Past medical history significant for: Palpitations/PSVT. 14-day Zio 03/16/2022: HR 56 to 184 bpm, average 82 bpm.  Predominantly sinus rhythm.  4 runs of SVT fastest 3 minutes 26 seconds with max rate 184 bpm, longest 18 minutes 59 seconds average rate 123 bpm.  SVT detected within +/- 45 seconds of symptomatic patient events.  Rare ectopy. Hyperlipidemia. Lipid panel 04/26/2024: LDL 71, HDL 44, TG 210, total 157. GERD. RA.  In summary, patient was first evaluated by Dr. Cave on 02/16/2022 for palpitations.  Patient reported a 58-month history of intermittent palpitations.  She was seen by PCP and noted to have palpitations lasting a couple of minutes.  HR was 140 bpm at that time EKG demonstrated sinus rhythm.  She reported symptoms typically occurring 1-2 times a week.  She denied dizziness or syncope.  She endorsed increased stress with some anxiety.  She reported a family history of A-fib in her father.  14-day ZIO demonstrated 4 runs of SVT.  Patient was started on Toprol .  Upon follow-up in July 2023 she reported feeling improved with only 1 episode of palpitations since starting Toprol .  Patient was last seen in the office by Dr. Cave on 06/01/2023 for routine follow-up.  She was doing well and had no new cardiac complaints.  Toprol  was continued with no changes.     History of Present Illness    Today, patient reports she is doing well from a cardiac standpoint.  She reports 2 episodes of palpitations in the last year lasting seconds and resolving spontaneously. Patient denies shortness of breath, dyspnea on exertion,  lower extremity edema, orthopnea or PND. No chest pain, pressure, or tightness.  Her right upper extremity is in a cast secondary to recent thumb surgery.  Her activity is limited by RA as well as bilateral knee pain.  She tries to stay as active as possible at home.  She is independent with all ADLs.  She is able to perform light to moderate household activities without limitation.    ROS: All other systems reviewed and are otherwise negative except as noted in History of Present Illness.  EKGs/Labs Reviewed    EKG Interpretation Date/Time:  Friday September 28 2024 10:32:27 EST Ventricular Rate:  61 PR Interval:  154 QRS Duration:  96 QT Interval:  394 QTC Calculation: 396 R Axis:   0  Text Interpretation: Normal sinus rhythm When compared with ECG of 31-Aug-2024 11:50, No significant change was found Confirmed by Loistine Sober [phone]) on 09/28/2024 10:40:46 AM   04/26/2024: ALT 14; AST 15 08/31/2024: BUN 15; Creatinine, Ser 0.85; Potassium 3.8; Sodium 143   08/31/2024: Hemoglobin 13.5; WBC 5.6   10/26/2023: TSH 1.92    Physical Exam    VS:  BP 130/78 (BP Location: Left Arm, Patient Position: Sitting)   Pulse 61   Ht 5' 2 (1.575 m)   Wt 213 lb (96.6 kg)   SpO2 97%   BMI 38.96 kg/m  , BMI Body mass index is 38.96 kg/m.  GEN: Well nourished, well developed, in no acute distress. Neck: No JVD or carotid  bruits. Cardiac:  RRR.  No murmur. No rubs or gallops.   Respiratory:  Respirations regular and unlabored. Clear to auscultation without rales, wheezing or rhonchi. GI: Soft, nontender, nondistended. Extremities: Radials/DP/PT 2+ and equal bilaterally. No clubbing or cyanosis. No edema.  Skin: Warm and dry, no rash. Neuro: Strength intact.  Assessment & Plan   Palpitations/PSVT 14-day Zio June 2023 demonstrated HR 56 to 184 bpm, average 82 bpm, 4 runs of symptomatic SVT fastest 3 minutes 26 seconds with max rate 184 bpm, longest 18 minutes 59 seconds average 123 bpm.   Patient reports 2 episode of palpitations in the last year lasting seconds and resolving spontaneously.  EKG demonstrates normal sinus rhythm 61 bpm.  We discussed taking an extra half dose of Toprol  as needed if palpitations become persistent. - Encouraged increased physical activity as tolerated to include seated exercises and aquatics. - Continue Toprol .  Hyperlipidemia LDL 71 August 2025, at goal. - Continue rosuvastatin . - Continue following with PCP.  Disposition: Return in 18 months or sooner as needed.         Signed, Barnie HERO. Ondra Deboard, DNP, NP-C  "

## 2024-09-28 ENCOUNTER — Ambulatory Visit: Attending: Student | Admitting: Student

## 2024-09-28 ENCOUNTER — Encounter: Payer: Self-pay | Admitting: Student

## 2024-09-28 VITALS — BP 130/78 | HR 61 | Ht 62.0 in | Wt 213.0 lb

## 2024-09-28 DIAGNOSIS — R002 Palpitations: Secondary | ICD-10-CM

## 2024-09-28 DIAGNOSIS — E782 Mixed hyperlipidemia: Secondary | ICD-10-CM | POA: Diagnosis not present

## 2024-09-28 DIAGNOSIS — I471 Supraventricular tachycardia, unspecified: Secondary | ICD-10-CM | POA: Diagnosis not present

## 2024-09-28 NOTE — Patient Instructions (Signed)
 Medication Instructions:   Your physician recommends that you continue on your current medications as directed. Please refer to the Current Medication list given to you today.    *If you need a refill on your cardiac medications before your next appointment, please call your pharmacy*  Lab Work:  None ordered at this time   If you have labs (blood work) drawn today and your tests are completely normal, you will receive your results only by:  MyChart Message (if you have MyChart) OR  A paper copy in the mail If you have any lab test that is abnormal or we need to change your treatment, we will call you to review the results.  Testing/Procedures:  None ordered at this time   Referrals:  None ordered at this time   Follow-Up:  At [facility], you and your health needs are our priority.  As part of our continuing mission to provide you with exceptional heart care, our providers are all part of one team.  This team includes your primary Cardiologist (physician) and Advanced Practice Providers or APPs (Physician Assistants and Nurse Practitioners) who all work together to provide you with the care you need, when you need it.  Your next appointment:   18 month(s)  Provider:    Redell Cave, MD or Barnie Hila, NP    We recommend signing up for the patient portal called MyChart.  Sign up information is provided on this After Visit Summary.  MyChart is used to connect with patients for Virtual Visits (Telemedicine).  Patients are able to view lab/test results, encounter notes, upcoming appointments, etc.  Non-urgent messages can be sent to your provider as well.   To learn more about what you can do with MyChart, go to [email or web address].

## 2024-10-09 ENCOUNTER — Other Ambulatory Visit: Payer: Self-pay

## 2024-10-09 ENCOUNTER — Telehealth: Payer: Self-pay | Admitting: Student

## 2024-10-09 ENCOUNTER — Other Ambulatory Visit (HOSPITAL_COMMUNITY): Payer: Self-pay

## 2024-10-09 NOTE — Telephone Encounter (Signed)
" °*  STAT* If patient is at the pharmacy, call can be transferred to refill team.   1. Which medications need to be refilled? (please list name of each medication and dose if known) metoprolol  succinate (TOPROL -XL) 25 MG 24 hr tablet   2. Which pharmacy/location (including street and city if local pharmacy) is medication to be sent to? [facility] Phone: [phone]  Fax: [phone]      3. Do they need a 30 day or 90 day supply? 90 Pt is out of medication  "

## 2024-10-10 ENCOUNTER — Other Ambulatory Visit: Payer: Self-pay

## 2024-10-11 ENCOUNTER — Other Ambulatory Visit: Payer: Self-pay

## 2024-10-12 ENCOUNTER — Other Ambulatory Visit: Payer: Self-pay | Admitting: Emergency Medicine

## 2024-10-12 ENCOUNTER — Other Ambulatory Visit: Payer: Self-pay

## 2024-10-12 ENCOUNTER — Other Ambulatory Visit (HOSPITAL_COMMUNITY): Payer: Self-pay

## 2024-10-12 ENCOUNTER — Other Ambulatory Visit: Payer: Self-pay | Admitting: Cardiology

## 2024-10-12 MED ORDER — METOPROLOL SUCCINATE ER 25 MG PO TB24
25.0000 mg | ORAL_TABLET | Freq: Every day | ORAL | 3 refills | Status: AC
  Filled 2024-10-12 – 2024-10-26 (×8): qty 90, 90d supply, fill #0

## 2024-10-12 MED ORDER — METOPROLOL SUCCINATE ER 25 MG PO TB24
25.0000 mg | ORAL_TABLET | Freq: Every day | ORAL | 0 refills | Status: DC
  Filled 2024-10-12 (×2): qty 15, 15d supply, fill #0

## 2024-10-12 NOTE — Telephone Encounter (Signed)
 Pt calling for refill again, states she is out.

## 2024-10-12 NOTE — Telephone Encounter (Signed)
 Patient came by office  States that [facility] pharmacy is requesting 90 day supply of metoprolol  but it being sent in for 15 day Please review

## 2024-10-15 ENCOUNTER — Other Ambulatory Visit: Payer: Self-pay

## 2024-10-16 ENCOUNTER — Other Ambulatory Visit: Payer: Self-pay

## 2024-10-16 ENCOUNTER — Other Ambulatory Visit (HOSPITAL_COMMUNITY): Payer: Self-pay

## 2024-10-17 ENCOUNTER — Other Ambulatory Visit: Payer: Self-pay

## 2024-10-17 ENCOUNTER — Other Ambulatory Visit: Payer: Self-pay | Admitting: Internal Medicine

## 2024-10-17 ENCOUNTER — Other Ambulatory Visit (HOSPITAL_COMMUNITY): Payer: Self-pay

## 2024-10-18 ENCOUNTER — Other Ambulatory Visit (HOSPITAL_COMMUNITY): Payer: Self-pay

## 2024-10-18 ENCOUNTER — Other Ambulatory Visit: Payer: Self-pay

## 2024-10-18 MED ORDER — GABAPENTIN 300 MG PO CAPS
900.0000 mg | ORAL_CAPSULE | Freq: Every evening | ORAL | 5 refills | Status: AC
  Filled 2024-10-18: qty 270, 90d supply, fill #0

## 2024-10-18 MED ORDER — BUPROPION HCL ER (SR) 150 MG PO TB12
150.0000 mg | ORAL_TABLET | Freq: Two times a day (BID) | ORAL | 1 refills | Status: AC
  Filled 2024-10-18: qty 180, 90d supply, fill #0

## 2024-10-24 ENCOUNTER — Other Ambulatory Visit (HOSPITAL_COMMUNITY): Payer: Self-pay

## 2024-10-24 ENCOUNTER — Other Ambulatory Visit: Payer: Self-pay

## 2024-10-25 ENCOUNTER — Ambulatory Visit: Admitting: Occupational Therapy

## 2024-10-26 ENCOUNTER — Other Ambulatory Visit (HOSPITAL_COMMUNITY): Payer: Self-pay

## 2024-10-26 ENCOUNTER — Other Ambulatory Visit: Payer: Self-pay

## 2024-10-26 ENCOUNTER — Other Ambulatory Visit

## 2024-10-26 DIAGNOSIS — E7800 Pure hypercholesterolemia, unspecified: Secondary | ICD-10-CM

## 2024-10-26 LAB — BASIC METABOLIC PANEL WITH GFR
BUN: 17 mg/dL (ref 6–23)
CO2: 31 meq/L (ref 19–32)
Calcium: 9.1 mg/dL (ref 8.4–10.5)
Chloride: 105 meq/L (ref 96–112)
Creatinine, Ser: 0.88 mg/dL (ref 0.40–1.20)
GFR: 74.91 mL/min
Glucose, Bld: 85 mg/dL (ref 70–99)
Potassium: 3.9 meq/L (ref 3.5–5.1)
Sodium: 143 meq/L (ref 135–145)

## 2024-10-26 LAB — LIPID PANEL
Cholesterol: 154 mg/dL (ref 28–200)
HDL: 44.7 mg/dL
LDL Cholesterol: 80 mg/dL (ref 10–99)
NonHDL: 109.04
Total CHOL/HDL Ratio: 3
Triglycerides: 144 mg/dL (ref 10.0–149.0)
VLDL: 28.8 mg/dL (ref 0.0–40.0)

## 2024-10-26 LAB — HEPATIC FUNCTION PANEL
ALT: 16 U/L (ref 3–35)
AST: 19 U/L (ref 5–37)
Albumin: 4.3 g/dL (ref 3.5–5.2)
Alkaline Phosphatase: 74 U/L (ref 39–117)
Bilirubin, Direct: 0.1 mg/dL (ref 0.1–0.3)
Total Bilirubin: 0.5 mg/dL (ref 0.2–1.2)
Total Protein: 6.2 g/dL (ref 6.0–8.3)

## 2024-10-30 ENCOUNTER — Ambulatory Visit: Admitting: Internal Medicine

## 2024-11-01 ENCOUNTER — Encounter

## 2024-11-01 ENCOUNTER — Ambulatory Visit: Admitting: Occupational Therapy

## 2024-11-19 ENCOUNTER — Encounter
